# Patient Record
Sex: Male | Born: 1965
Health system: Southern US, Community
[De-identification: ages and names within clinical notes are randomized; demographics above are authoritative.]

## PROBLEM LIST (undated history)

## (undated) DIAGNOSIS — I513 Intracardiac thrombosis, not elsewhere classified: Secondary | ICD-10-CM

## (undated) DIAGNOSIS — K219 Gastro-esophageal reflux disease without esophagitis: Secondary | ICD-10-CM

## (undated) DIAGNOSIS — E7800 Pure hypercholesterolemia, unspecified: Secondary | ICD-10-CM

## (undated) DIAGNOSIS — I639 Cerebral infarction, unspecified: Secondary | ICD-10-CM

## (undated) DIAGNOSIS — T7840XA Allergy, unspecified, initial encounter: Secondary | ICD-10-CM

## (undated) DIAGNOSIS — G43109 Migraine with aura, not intractable, without status migrainosus: Secondary | ICD-10-CM

## (undated) DIAGNOSIS — Z8619 Personal history of other infectious and parasitic diseases: Secondary | ICD-10-CM

## (undated) DIAGNOSIS — I240 Acute coronary thrombosis not resulting in myocardial infarction: Secondary | ICD-10-CM

## (undated) DIAGNOSIS — G43909 Migraine, unspecified, not intractable, without status migrainosus: Secondary | ICD-10-CM

## (undated) DIAGNOSIS — I10 Essential (primary) hypertension: Secondary | ICD-10-CM

## (undated) HISTORY — PX: HERNIA REPAIR: SHX51

## (undated) HISTORY — DX: Allergy, unspecified, initial encounter: T78.40XA

## (undated) HISTORY — DX: Personal history of other infectious and parasitic diseases: Z86.19

---

## 2008-01-22 LAB — HM COLONOSCOPY

## 2009-09-08 DIAGNOSIS — F411 Generalized anxiety disorder: Secondary | ICD-10-CM | POA: Insufficient documentation

## 2011-03-15 DIAGNOSIS — K219 Gastro-esophageal reflux disease without esophagitis: Secondary | ICD-10-CM | POA: Insufficient documentation

## 2012-02-11 DIAGNOSIS — B009 Herpesviral infection, unspecified: Secondary | ICD-10-CM | POA: Insufficient documentation

## 2012-07-02 DIAGNOSIS — E559 Vitamin D deficiency, unspecified: Secondary | ICD-10-CM | POA: Insufficient documentation

## 2017-11-13 ENCOUNTER — Encounter: Payer: Self-pay | Admitting: Internal Medicine

## 2017-11-13 ENCOUNTER — Other Ambulatory Visit (INDEPENDENT_AMBULATORY_CARE_PROVIDER_SITE_OTHER): Payer: BLUE CROSS/BLUE SHIELD

## 2017-11-13 ENCOUNTER — Ambulatory Visit (INDEPENDENT_AMBULATORY_CARE_PROVIDER_SITE_OTHER): Payer: BLUE CROSS/BLUE SHIELD | Admitting: Internal Medicine

## 2017-11-13 VITALS — BP 130/88 | HR 107 | Ht 67.5 in | Wt 177.6 lb

## 2017-11-13 DIAGNOSIS — J45991 Cough variant asthma: Secondary | ICD-10-CM

## 2017-11-13 DIAGNOSIS — R05 Cough: Secondary | ICD-10-CM

## 2017-11-13 DIAGNOSIS — R059 Cough, unspecified: Secondary | ICD-10-CM

## 2017-11-13 LAB — CBC WITH DIFFERENTIAL/PLATELET
BASOS PCT: 1.1 % (ref 0.0–3.0)
Basophils Absolute: 0.1 10*3/uL (ref 0.0–0.1)
Eosinophils Absolute: 0.2 10*3/uL (ref 0.0–0.7)
Eosinophils Relative: 3.3 % (ref 0.0–5.0)
HEMATOCRIT: 42.8 % (ref 39.0–52.0)
HEMOGLOBIN: 14.8 g/dL (ref 13.0–17.0)
LYMPHS PCT: 21.2 % (ref 12.0–46.0)
Lymphs Abs: 1.6 10*3/uL (ref 0.7–4.0)
MCHC: 34.6 g/dL (ref 30.0–36.0)
MCV: 88.8 fl (ref 78.0–100.0)
MONOS PCT: 9.7 % (ref 3.0–12.0)
Monocytes Absolute: 0.7 10*3/uL (ref 0.1–1.0)
Neutro Abs: 4.8 10*3/uL (ref 1.4–7.7)
Neutrophils Relative %: 64.7 % (ref 43.0–77.0)
Platelets: 344 10*3/uL (ref 150.0–400.0)
RBC: 4.82 Mil/uL (ref 4.22–5.81)
RDW: 12.6 % (ref 11.5–15.5)
WBC: 7.4 10*3/uL (ref 4.0–10.5)

## 2017-11-13 LAB — SPIROMETRY WITH GRAPH: Nitric Oxide: 38

## 2017-11-13 MED ORDER — CEFDINIR 300 MG PO CAPS: 300.0000 mg | ORAL_CAPSULE | Freq: Two times a day (BID) | ORAL | 0 refills | Status: DC

## 2017-11-13 MED ORDER — MOMETASONE FURO-FORMOTEROL FUM 100-5 MCG/ACT IN AERO: 2.0000 | INHALATION_SPRAY | Freq: Two times a day (BID) | RESPIRATORY_TRACT | 0 refills | Status: DC

## 2017-11-13 NOTE — Progress Notes (Addendum)
Keith Black, male    DOB: 1966-01-24, 52 y.o.   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 Pulmonary Consult    Self referral.  Pt c/o cough, wheezing and SOB for the past 2-3 months. He moved here from [place] 6 months ago. He is using proair 4-5 x per day. He occ coughs up some green sputum in the am's.    last saba 1.5 hours / symb 160 one bid no better  Symptoms worse p stirs does not typically wake him up but p stirring has sev tsp green mucus  Just in am's assoc nasal congestion Continues with multiple saba thru the day with only temporary relief.   No obvious day to day or daytime variability or assoc excess/ purulent sputum or mucus plugs or hemoptysis or cp or chest tightness, subjective wheeze or overt  hb symptoms on ppi qam and prn h2 hs not taking now  Sleeping as above  without nocturnal exacerbation  of respiratory  c/o's or need for noct saba. Also denies any obvious fluctuation of symptoms with weather or environmental changes or other aggravating or alleviating factors except as outlined above   No unusual exposure hx or h/o childhood pna/ asthma or knowledge of premature birth.  Current Allergies, Complete Past Medical History, Past Surgical History, Family History, and Social History were reviewed in [employer] record.  ROS  The following  are not active complaints unless bolded Hoarseness, sore throat, dysphagia, dental problems, itching, sneezing,  nasal congestion or discharge of excess mucus or purulent secretions, ear ache,   fever, chills, sweats, unintended wt loss or wt gain, classically pleuritic or exertional cp,  orthopnea pnd or arm/hand swelling  or leg swelling, presyncope, palpitations, abdominal pain, anorexia, nausea, vomiting, diarrhea  or change in bowel habits or change in bladder habits, change in stools or change in urine, dysuria, hematuria,  rash, arthralgias, visual complaints, headache, numbness, weakness or ataxia or problems with walking or coordination,  change in mood or  memory.             No past medical history on file.  Outpatient Medications Prior to Visit  Medication Sig Dispense Refill  . albuterol (PROAIR HFA) 108 (90 Base) MCG/ACT inhaler Inhale 2 puffs into the lungs every 6 (six) hours as needed for wheezing or shortness of breath.    Marland Kitchen azelastine (ASTELIN) 0.1 % nasal spray Place 2 sprays into both nostrils 2 (two) times daily. Use in each nostril as directed    . budesonide-formoterol (SYMBICORT) 160-4.5 MCG/ACT inhaler Inhale 1 puff into the lungs 2 (two) times daily.    . cetirizine (ZYRTEC) 10 MG tablet Take 10 mg by mouth daily.    . Cholecalciferol (VITAMIN D-3) 1000 units CAPS Take 1 capsule by mouth daily.    Marland Kitchen  fluticasone (FLONASE) 50 MCG/ACT nasal spray Place 1 spray into both nostrils daily.    Marland Kitchen losartan (COZAAR) 100 MG tablet Take 100 mg by mouth daily.    . pantoprazole (PROTONIX) 40 MG tablet Take 40 mg by mouth daily.    . ranitidine (ZANTAC) 150 MG tablet Take 150 mg by mouth daily.    . simvastatin (ZOCOR) 20 MG tablet Take 20 mg by mouth daily.     No facility-administered medications prior to visit.               Objective:     BP 130/88 (BP Location: Left Arm, Cuff Size: Normal)   Pulse (!) 107   Ht 5' 7.5" (1.715 m)   Wt 177 lb 9.6 oz (80.6 kg)    SpO2 99%   BMI 27.41 kg/m   SpO2: 99 % RA  Pleasant healthy appearing wm nad   HEENT: nl dentition,  and oropharynx. Nl external ear canals without cough reflex -  moderate bilateral non-specific turbinate edema     NECK :  without JVD/Nodes/TM/ nl carotid upstrokes bilaterally   LUNGS: no acc muscle use,  Nl contour chest which is clear to A and P bilaterally without cough on insp or exp maneuvers   CV:  RRR  no s3 or murmur or increase in P2, and no edema   ABD:  soft and nontender with nl inspiratory excursion in the supine position. No bruits or organomegaly appreciated, bowel sounds nl  MS:  Nl gait/ ext warm without deformities, calf tenderness, cyanosis or clubbing No obvious joint restrictions   SKIN: warm and dry without lesions    NEURO:  alert, approp, nl sensorium with  no motor or cerebellar deficits apparent.    Labs ordered 11/13/2017  Allergy profile       Assessment   Cough variant asthma Allergy profile 11/13/2017 >  Eos 0. /  IgE   11/13/2017  After extensive coaching inhaler device,  effectiveness =    Try dulera 100 2bid  - FENO 11/13/2017  =   38  - Sinus CT 11/13/2017 >>>    DDX of  difficult airways management almost all start with A and  include Adherence, Ace Inhibitors, Acid Reflux, Active Sinus Disease, Alpha 1 Antitripsin deficiency, Anxiety masquerading as Airways dz,  ABPA,  Allergy(esp in young), Aspiration (esp in elderly), Adverse effects of meds,  Active smokers, A bunch of PE's (a small clot burden can't cause this syndrome unless there is already severe underlying pulm or vascular dz with poor reserve) plus two Bs  = Bronchiectasis and Beta blocker use..and one C= CHF   Adherence is always the initial "prime suspect" and is a multilayered concern that requires a "trust but verify" approach in every patient - starting with knowing how to use medications, especially inhalers, correctly, keeping up with refills and understanding the  fundamental difference between maintenance and prns vs those medications only taken for a very short course and then stopped and not refilled.  - misunderstood symbicort instructions - see hfa teaching - return with all meds in hand using a trust but verify approach to confirm accurate Medication  Reconciliation The principal here is that until we are certain that the  patients are doing what we've asked, it makes no sense to ask them to do more.    ? Allergy > check profile, hold prednisone for now  ? ABPA > check IgE  ? Active sinus infections > omincef 300 mg  bid and check sinus ct prior to jet travel to [place] and give a full 20 days if pos  ? Acid (or non-acid) GERD > always difficult to exclude as up to 75% of pts in some series report no assoc GI/ Heartburn symptoms> rec max (24h)  acid suppression and diet restrictions/ reviewed and instructions given in writing.  - Of the three most common causes of  Sub-acute / recurrent or chronic cough, only one (GERD)  can actually contribute to/ trigger  the other two (asthma and post nasal drip syndrome)  and perpetuate the cylce of cough.  While not intuitively obvious, many patients with chronic low grade reflux do not cough until there is a primary insult that disturbs the protective epithelial barrier and exposes sensitive nerve endings.   This is typically viral but can due to PNDS and  either may apply here.   The point is that once this occurs, it is difficult to eliminate the cycle  using anything but a maximally effective acid suppression regimen at least in the short run, accompanied by an appropriate diet to address non acid GERD and control / eliminate the cough itself with mucinex dm   ? Adverse drug effects > high dose symb may be problematic here > try dulera 100 2bid instead   ? acei effect from arb: For reasons that may related to vascular permability and nitric oxide pathways but not elevated  bradykinin levels (as seen with  ACEi  use) losartan in the generic form has been reported now from mulitple sources  to cause a similar pattern of non-specific  upper airway symptoms as seen with acei.   This has not been reported with exposure to the other ARB's to date, so may need to  try either generic diovan or avapro if ARB needed or use an alternative class altogether.  See:  Lelon Frohlich Allergy Asthma Immunol  2008: 101: p [phone]      Total time devoted to counseling  > 50 % of initial 60 min office visit:  review case with pt/ discussion of options/alternatives/ personally creating written customized instructions  in presence of pt  then going over those specific  Instructions directly with the pt including how to use all of the meds but in particular covering each new medication in detail and the difference between the maintenance= "automatic" meds and the prns using an action plan format for the latter (If this problem/symptom => do that organization reading Left to right).  Please see AVS from this visit for a full list of these instructions which I personally wrote for this pt and  are unique to this visit.   See device teaching which extended face to face time for this visit      Christinia Gully, MD 11/13/2017

## 2017-11-13 NOTE — Assessment & Plan Note (Addendum)
Allergy profile 11/13/2017 >  Eos 0. /  IgE   11/13/2017  After extensive coaching inhaler device,  effectiveness =    Try dulera 100 2bid  - FENO 11/13/2017  =   38  - Sinus CT 11/13/2017 >>>    DDX of  difficult airways management almost all start with A and  include Adherence, Ace Inhibitors, Acid Reflux, Active Sinus Disease, Alpha 1 Antitripsin deficiency, Anxiety masquerading as Airways dz,  ABPA,  Allergy(esp in young), Aspiration (esp in elderly), Adverse effects of meds,  Active smokers, A bunch of PE's (a small clot burden can't cause this syndrome unless there is already severe underlying pulm or vascular dz with poor reserve) plus two Bs  = Bronchiectasis and Beta blocker use..and one C= CHF   Adherence is always the initial "prime suspect" and is a multilayered concern that requires a "trust but verify" approach in every patient - starting with knowing how to use medications, especially inhalers, correctly, keeping up with refills and understanding the fundamental difference between maintenance and prns vs those medications only taken for a very short course and then stopped and not refilled.  - misunderstood symbicort instructions - see hfa teaching - return with all meds in hand using a trust but verify approach to confirm accurate Medication  Reconciliation The principal here is that until we are certain that the  patients are doing what we've asked, it makes no sense to ask them to do more.    ? Allergy > check profile, hold prednisone for now  ? ABPA > check IgE  ? Active sinus infections > omincef 300 mg bid and check sinus ct prior to jet travel to [place] and give a full 20 days if pos  ? Acid (or non-acid) GERD > always difficult to exclude as up to 75% of pts in some series report no assoc GI/ Heartburn symptoms> rec max (24h)  acid suppression and diet restrictions/ reviewed and instructions given in writing.  - Of the three most common causes of  Sub-acute / recurrent or  chronic cough, only one (GERD)  can actually contribute to/ trigger  the other two (asthma and post nasal drip syndrome)  and perpetuate the cylce of cough.  While not intuitively obvious, many patients with chronic low grade reflux do not cough until there is a primary insult that disturbs the protective epithelial barrier and exposes sensitive nerve endings.   This is typically viral but can due to PNDS and  either may apply here.   The point is that once this occurs, it is difficult to eliminate the cycle  using anything but a maximally effective acid suppression regimen at least in the short run, accompanied by an appropriate diet to address non acid GERD and control / eliminate the cough itself with mucinex dm   ? Adverse drug effects > high dose symb may be problematic here > try dulera 100 2bid instead   ? acei effect from arb: For reasons that may related to vascular permability and nitric oxide pathways but not elevated  bradykinin levels (as seen with  ACEi use) losartan in the generic form has been reported now from mulitple sources  to cause a similar pattern of non-specific  upper airway symptoms as seen with acei.   This has not been reported with exposure to the other ARB's to date, so may need to  try either generic diovan or avapro if ARB needed or use an alternative class altogether.  See:  [facility] Allergy Asthma Immunol  2008: 101: p [phone]      Total time devoted to counseling  > 50 % of initial 60 min office visit:  review case with pt/ discussion of options/alternatives/ personally creating written customized instructions  in presence of pt  then going over those specific  Instructions directly with the pt including how to use all of the meds but in particular covering each new medication in detail and the difference between the maintenance= "automatic" meds and the prns using an action plan format for the latter (If this problem/symptom => do that organization reading Left to right).   Please see AVS from this visit for a full list of these instructions which I personally wrote for this pt and  are unique to this visit.   See device teaching which extended face to face time for this visit

## 2017-11-13 NOTE — Patient Instructions (Addendum)
Stop symbicort and start dulera 100 Take 2 puffs first thing in am and then another 2 puffs about 12 hours later.   Only use your albuterol as a rescue medication to be used if you can't catch your breath by resting or doing a relaxed purse lip breathing pattern.  - The less you use it, the better it will work when you need it. - Ok to use up to 2 puffs  every 4 hours if you must but call for immediate appointment if use goes up over your usual need - Don't leave home without it !!  (think of it like the spare tire for your car)   Omnicef 300 mg twice daily x 10 days and eat yogurt or take pro biotic to prevent diarrhea   Continue protonix 40 mg Take 30-60 min before first meal of the day and zantac 150 mg at bedtime  until return   GERD (REFLUX)  is an extremely common cause of respiratory symptoms just like yours , many times with no obvious heartburn at all.    It can be treated with medication, but also with lifestyle changes including elevation of the head of your bed (ideally with 6 inch  bed blocks),  Smoking cessation, avoidance of late meals, excessive alcohol, and avoid fatty foods, chocolate, peppermint, colas, red wine, and acidic juices such as orange juice.  NO MINT OR MENTHOL PRODUCTS SO NO COUGH DROPS   USE SUGARLESS CANDY INSTEAD (Jolley ranchers or Stover's or Life Savers) or even ice chips will also do - the key is to swallow to prevent all throat clearing. NO OIL BASED VITAMINS - use powdered substitutes.   For cough mucinex dm 1200 mg every 12 hours as needed   Please see patient coordinator before you leave today  to schedule sinus CT   Please remember to go to the lab department downstairs in the basement  for your tests - we will call you with the results when they are available.      Please schedule a follow up office visit in 2 weeks, sooner if needed  with all medications /inhalers/ solutions in hand so we can verify exactly what you are taking. This includes all  medications from all doctors and over the counters

## 2017-11-14 ENCOUNTER — Telehealth: Payer: Self-pay | Admitting: Internal Medicine

## 2017-11-14 LAB — RESPIRATORY ALLERGY PROFILE REGION II ~~LOC~~
Allergen, Cottonwood, t14: <0.1 kU/L
Allergen, D pternoyssinus,d7: <0.1 kU/L
Allergen, Mouse Urine Protein, e78: <0.1 kU/L
Allergen, Oak,t7: <0.1 kU/L
Bermuda Grass: <0.1 kU/L
CLASS: 0
CLASS: 0
CLASS: 0
CLASS: 0
CLASS: 0
CLASS: 0
CLASS: 0
CLASS: 0
CLASS: 0
CLASS: 0
CLASS: 0
Cat Dander: <0.1 kU/L
Class: 0
Class: 0
Class: 0
Class: 0
Class: 0
Class: 0
Class: 0
Class: 0
Class: 0
Class: 0
Class: 0
Class: 0
Class: 0
Cockroach: <0.1 kU/L
D. farinae: <0.1 kU/L
Elm IgE: <0.1 kU/L
IgE (Immunoglobulin E), Serum: 88 kU/L (ref <=114)
Johnson Grass: <0.1 kU/L
Sheep Sorrel IgE: <0.1 kU/L
Timothy Grass: <0.1 kU/L

## 2017-11-14 LAB — INTERPRETATION:

## 2017-11-14 NOTE — Telephone Encounter (Signed)
Called and spoke with pt's spouse Keith Black letting her know that as soon as we had the results of the labwork we would call her to let her know the results.  Kristine expressed understanding. Nothing further needed.

## 2017-11-14 NOTE — Progress Notes (Signed)
Spoke with pt's spouse and notified of results per Dr. Wert. She verbalized understanding and denied any questions. 

## 2017-11-27 ENCOUNTER — Telehealth: Payer: Self-pay | Admitting: Internal Medicine

## 2017-11-27 NOTE — Telephone Encounter (Signed)
PA initiated for [facility] 136mcg Inhaler today 11/27/17 Processed request through www.covermymeds.com - Key: [id number]  Completed the forms for [facility] insurance It will take anywhere from 24-72 hours for decision of approval or denial.  Routing message to [place] to f/u on later in the week.

## 2017-11-29 NOTE — Telephone Encounter (Signed)
Update on Pt's PA request for [facility] 100 Inhaler today This request has received a Cancelled outcome. This may mean either your patient does not have active coverage with this plan, this authorization was processed as a duplicate request, or an authorization was not needed for this medication. Note any additional information provided by [facility] at the bottom of this request, and contact [employer]  directly for further   Pt no longer has insurance as of 11/27/2017  Attempted to call patient today regarding above information. I did not receive an answer at time of call. I have left a voicemail message for pt to return call. X1  Routing message to [employer] to f/u with patient.

## 2017-12-02 DIAGNOSIS — H532 Diplopia: Secondary | ICD-10-CM | POA: Diagnosis not present

## 2017-12-02 DIAGNOSIS — H5310 Unspecified subjective visual disturbances: Secondary | ICD-10-CM | POA: Diagnosis not present

## 2017-12-02 DIAGNOSIS — G43109 Migraine with aura, not intractable, without status migrainosus: Secondary | ICD-10-CM | POA: Diagnosis not present

## 2017-12-04 ENCOUNTER — Inpatient Hospital Stay: Admission: RE | Admit: 2017-12-04 | Payer: BLUE CROSS/BLUE SHIELD | Source: Ambulatory Visit

## 2017-12-06 ENCOUNTER — Ambulatory Visit: Payer: BLUE CROSS/BLUE SHIELD | Admitting: Internal Medicine

## 2017-12-09 NOTE — Telephone Encounter (Signed)
LMTCB

## 2017-12-11 NOTE — Telephone Encounter (Signed)
LMTCB

## 2017-12-12 ENCOUNTER — Ambulatory Visit (INDEPENDENT_AMBULATORY_CARE_PROVIDER_SITE_OTHER)
Admission: RE | Admit: 2017-12-12 | Discharge: 2017-12-12 | Disposition: A | Discharge status: Home / Self Care | Payer: BLUE CROSS/BLUE SHIELD | Source: Ambulatory Visit | Attending: Internal Medicine | Admitting: Internal Medicine

## 2017-12-12 DIAGNOSIS — J45909 Unspecified asthma, uncomplicated: Secondary | ICD-10-CM | POA: Diagnosis not present

## 2017-12-12 DIAGNOSIS — R059 Cough, unspecified: Secondary | ICD-10-CM

## 2017-12-12 DIAGNOSIS — J45991 Cough variant asthma: Secondary | ICD-10-CM | POA: Diagnosis not present

## 2017-12-12 DIAGNOSIS — R05 Cough: Secondary | ICD-10-CM | POA: Diagnosis not present

## 2017-12-12 NOTE — Progress Notes (Signed)
LMTCB

## 2017-12-13 NOTE — Telephone Encounter (Signed)
Patient's wife is calling back about Dulera.  States she spoke with insurance and they have told her this requires PA, fax [phone].  Altha Harm, (wife) CB is [phone].

## 2017-12-13 NOTE — Telephone Encounter (Signed)
Called and spoke with patient's wife. She states she called [facility] and was not sure if the PA was started or not.   I called [facility] to verify the PA and it wasn't started so I started a PA over the phone for [facility]. [facility] representative stated the PA was marked urgent and would notify patient in 24-48 hours if approved. PA: [id number]  Saw Dr. Morrison Old recs , called patient to inform of the phone call and Dr. Morrison Old rec. Patient verbalized understanding, however patient did not want any symbicort samples at this time. Patient states he hasn't been taking dulera for about a week now and it is not an urgent matter.  Will keep in box to follow up on PA  Will route to MW as Juluis Rainier

## 2017-12-13 NOTE — Telephone Encounter (Signed)
[  atient returned phone call; pt contact# [phone]

## 2017-12-13 NOTE — Telephone Encounter (Signed)
Should be able to offer sbicort 80 2bid sample until work out insurance

## 2017-12-13 NOTE — Telephone Encounter (Signed)
Called spoke with patient's wife as patient is at work. Read the information provided per [facility] phone note on 11/29/2017. She says they have insurance and read me the numbers off their [place] card, which match what our records show.  They also have their medication sent to [facility] and have a card from there. Information provided by patient's wife below: [facility] RXBIN: [id number] RXPCN:[id number] [id number] ID: [id number].  Wife is going to call [employer] and see what is going on.  We will need to follow back up with patient next week. Patient is about out of the [facility] per wife and may not be taking it. No samples available at this time.  Routing to MW and Magda Paganini

## 2017-12-16 ENCOUNTER — Encounter: Payer: Self-pay | Admitting: Internal Medicine

## 2017-12-16 ENCOUNTER — Ambulatory Visit (INDEPENDENT_AMBULATORY_CARE_PROVIDER_SITE_OTHER): Payer: BLUE CROSS/BLUE SHIELD | Admitting: Internal Medicine

## 2017-12-16 VITALS — BP 118/84 | HR 100 | Ht 67.5 in | Wt 177.4 lb

## 2017-12-16 DIAGNOSIS — Z23 Encounter for immunization: Secondary | ICD-10-CM

## 2017-12-16 DIAGNOSIS — J45991 Cough variant asthma: Secondary | ICD-10-CM | POA: Diagnosis not present

## 2017-12-16 MED ORDER — BUDESONIDE-FORMOTEROL FUMARATE 80-4.5 MCG/ACT IN AERO: 2.0000 | INHALATION_SPRAY | Freq: Two times a day (BID) | RESPIRATORY_TRACT | 0 refills | Status: DC

## 2017-12-16 MED ORDER — BUDESONIDE-FORMOTEROL FUMARATE 80-4.5 MCG/ACT IN AERO: 2.0000 | INHALATION_SPRAY | Freq: Two times a day (BID) | RESPIRATORY_TRACT | 11 refills | Status: DC

## 2017-12-16 MED ORDER — PANTOPRAZOLE SODIUM 40 MG PO TBEC: 40.0000 mg | DELAYED_RELEASE_TABLET | Freq: Every day | ORAL | 1 refills | Status: DC

## 2017-12-16 NOTE — Patient Instructions (Addendum)
Symbicort 80 can be taken up to 2 puff every 12 hours if any symptoms of cough / congestion/ wheeze/ short of breath as needed   Work on inhaler technique:  relax and gently blow all the way out then take a nice smooth deep breath back in, triggering the inhaler at same time you start breathing in.  Hold for up to 5 seconds if you can. Blow out thru nose. Rinse and gargle with water when done  Please schedule a follow up office visit in 6 weeks, call sooner if needed  - needs pfts on return

## 2017-12-16 NOTE — Progress Notes (Signed)
Keith Black, male    DOB: 02/16/1966, 52 y.o.   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 Pulmonary Consult    Self referral.  Pt c/o cough, wheezing and SOB for the past 2-3 months. He moved here from [place] 6 months ago. He is using proair 4-5 x per day. He occ coughs up some green sputum in the am's.    last saba 1.5 hours / symb 160 one bid no better  Symptoms worse p stirs does not typically wake him up but p stirring has sev tsp green mucus  Just in am's assoc nasal congestion Continues with multiple saba thru the day with only temporary relief. rec Stop symbicort and start dulera 100 Take 2 puffs first thing in am and then another 2 puffs about 12 hours later.  Only use your albuterol as a rescue medication Omnicef 300 mg twice daily x 10 days and eat yogurt or take pro biotic to prevent diarrhea  Continue protonix 40 mg Take 30-60 min before first meal of the day and zantac 150 mg at bedtime  until return  GERD  For cough mucinex dm 1200 mg every 12 hours as needed Please see patient coordinator before you leave today  to schedule sinus CT Please remember to go to the lab department       12/16/2017  f/u ov/Wert re: cough variant asthma some worse off symbicort/dulera but not bad enough to use saba  Chief Complaint  Patient presents with  . Follow-up    Cough has  improved.  He is having some chest tightness this morning. He has not had to use his albuterol inhaler.    Dyspnea:  No limits while on maint rx Cough: minimal/  Sleeping: ok SABA use: none   No obvious day to day or daytime variability or assoc excess/ purulent sputum or mucus plugs or hemoptysis or cp or subjective wheeze or overt sinus or hb symptoms.   Sleeping as above  without nocturnal  or early am exacerbation  of respiratory  c/o's or need for noct saba. Also denies any obvious fluctuation of symptoms with weather or environmental changes or other aggravating or alleviating factors except as outlined above   No unusual exposure hx or h/o childhood pna/ asthma or knowledge of premature birth.  Current Allergies, Complete Past Medical History, Past Surgical History, Family History, and Social History were reviewed in [employer] record.  ROS  The following are not active complaints unless bolded Hoarseness, sore throat, dysphagia, dental problems, itching, sneezing,  nasal congestion or discharge of excess mucus or purulent secretions, ear ache,   fever, chills, sweats, unintended wt loss or wt gain, classically pleuritic or exertional cp,  orthopnea pnd or arm/hand swelling  or leg swelling, presyncope, palpitations, abdominal pain, anorexia, nausea, vomiting,  diarrhea  or change in bowel habits or change in bladder habits, change in stools or change in urine, dysuria, hematuria,  rash, arthralgias, visual complaints, headache, numbness, weakness or ataxia or problems with walking or coordination,  change in mood or  memory.        Current Meds  Medication Sig  . albuterol (PROAIR HFA) 108 (90 Base) MCG/ACT inhaler Inhale 2 puffs into the lungs every 6 (six) hours as needed for wheezing or shortness of breath.  Marland Kitchen azelastine (ASTELIN) 0.1 % nasal spray Place 2 sprays into both nostrils 2 (two) times daily. Use in each nostril as directed  . cetirizine (ZYRTEC) 10  MG tablet Take 10 mg by mouth daily.  . Cholecalciferol ([employer]S VIT D 5000 HIGH-POTENCY PO) Take 1 capsule by mouth daily.  . fluticasone (FLONASE) 50 MCG/ACT nasal spray Place 1 spray into both nostrils daily.  Marland Kitchen losartan (COZAAR) 100 MG tablet Take 100 mg by mouth daily.  . pantoprazole (PROTONIX) 40 MG tablet Take 1 tablet (40 mg total) by mouth daily.  . Probiotic Product (ALIGN PO) Take 1 capsule by mouth daily.  . ranitidine (ZANTAC) 150 MG tablet Take 150 mg by mouth daily.  . simvastatin (ZOCOR) 20 MG tablet Take 20 mg by mouth daily.  Marland Kitchen UNABLE TO FIND Med Name: [employer]  .   pantoprazole (PROTONIX) 40 MG tablet Take 40 mg by mouth daily.                         Objective:      amb wm nad  Wt Readings from Last 3 Encounters:  12/16/17 177 lb 6.4 oz (80.5 kg)  11/13/17 177 lb 9.6 oz (80.6 kg)     Vital signs reviewed - Note on arrival 02 sats  100% on RA      HEENT: nl dentition, turbinates bilaterally, and oropharynx. Nl external ear canals without cough reflex   NECK :  without JVD/Nodes/TM/ nl carotid upstrokes bilaterally   LUNGS: no acc muscle use,  Nl contour chest which is clear to A and P bilaterally without cough on insp or exp maneuvers   CV:  RRR  no s3 or murmur or increase in P2, and no edema   ABD:  soft and nontender with nl inspiratory excursion in the supine position. No bruits or organomegaly appreciated, bowel sounds nl  MS:  Nl gait/ ext warm without deformities, calf tenderness, cyanosis or clubbing No obvious joint restrictions   SKIN: warm and dry without lesions    NEURO:  alert, approp, nl sensorium with  no motor or cerebellar deficits apparent.        Assessment

## 2017-12-17 ENCOUNTER — Telehealth: Payer: Self-pay | Admitting: Internal Medicine

## 2017-12-17 ENCOUNTER — Telehealth: Payer: Self-pay | Admitting: *Deleted

## 2017-12-17 DIAGNOSIS — J45991 Cough variant asthma: Secondary | ICD-10-CM

## 2017-12-17 NOTE — Telephone Encounter (Signed)
-----   Message from Tanda Rockers, MD sent at 12/17/2017  9:20 AM EDT ----- Chart reviewed and realized he has not had full pfts so need to do on return but not use his symbicort prior (if at all possible)

## 2017-12-17 NOTE — Telephone Encounter (Signed)
Medication name and strength: Dulera 100 Provider: Dr. Melvyn Novas Pharmacy: [facility] [address] Patient insurance ID: [id number]  Was the PA started on CMM?  Yes If yes, please enter the Key: [id number] Timeframe for approval/denial: 24 hours

## 2017-12-17 NOTE — Telephone Encounter (Signed)
PA for [facility] 100 has been denied.  Pt must try or fail at least 3 of the following: Advair Diskus, Advair HFA, Breo Ellipta, Symbicort.   Per pt chart, pt has only tried Symbicort in the past.  MW please advise on preferred alternative.  Thanks!

## 2017-12-17 NOTE — Telephone Encounter (Signed)
See phone note dated 12/17/17

## 2017-12-17 NOTE — Telephone Encounter (Signed)
Pt already started on symbicort 80 at recent ov so will close this encounter

## 2017-12-18 ENCOUNTER — Encounter: Payer: Self-pay | Admitting: Internal Medicine

## 2017-12-18 NOTE — Assessment & Plan Note (Signed)
Allergy profile 11/13/2017 >  Eos 0.2/  IgE  88 RAST neg  11/13/2017       Try dulera 100 2bid > changed to symb 80 2bid by insurance restriction - FENO 11/13/2017  =   38  - Sinus CT 12/12/2017  >>>  wnl    12/16/2017  After extensive coaching inhaler device,  effectiveness =    90%    While on low dose ics/laba>> All goals of chronic asthma control met including optimal function and elimination of symptoms with minimal need for rescue therapy.  Contingencies discussed in full including contacting this office immediately if not controlling the symptoms using the rule of two's.     His condition must be mild as when he ran out of dulera he had minimal symptoms which immediately improved when had access to symbicort 80 sample.    Based on two studies from NEJM  378; 20 p 1865 (2018) and 380 : p2020-30 (2019) in pts with mild asthma it is reasonable to use low dose symbicort eg 80 2bid "prn" flare in this setting but I emphasized this was only shown with symbicort and takes advantage of the rapid onset of action but is not the same as "rescue therapy" but can be stopped once the acute symptoms have resolved and the need for rescue has been minimized (< 2 x weekly)     I had an extended discussion with the patient reviewing all relevant studies completed to date and  lasting 15 to 20 minutes of a 25 minute visit    See device teaching which extended face to face time for this visit.  Each maintenance medication was reviewed in detail including emphasizing most importantly the difference between maintenance and prns and under what circumstances the prns are to be triggered using an action plan format that is not reflected in the computer generated alphabetically organized AVS which I have not found useful in most complex patients, especially with respiratory illnesses  Please see AVS for specific instructions unique to this visit that I personally wrote and verbalized to the the pt in detail and  then reviewed with pt  by my nurse highlighting any  changes in therapy recommended at today's visit to their plan of care.

## 2017-12-31 DIAGNOSIS — H532 Diplopia: Secondary | ICD-10-CM | POA: Diagnosis not present

## 2017-12-31 DIAGNOSIS — G43109 Migraine with aura, not intractable, without status migrainosus: Secondary | ICD-10-CM | POA: Diagnosis not present

## 2017-12-31 DIAGNOSIS — H5310 Unspecified subjective visual disturbances: Secondary | ICD-10-CM | POA: Diagnosis not present

## 2018-01-18 DIAGNOSIS — H6501 Acute serous otitis media, right ear: Secondary | ICD-10-CM | POA: Diagnosis not present

## 2018-01-18 DIAGNOSIS — H6121 Impacted cerumen, right ear: Secondary | ICD-10-CM | POA: Diagnosis not present

## 2018-01-18 DIAGNOSIS — J019 Acute sinusitis, unspecified: Secondary | ICD-10-CM | POA: Diagnosis not present

## 2018-01-22 ENCOUNTER — Telehealth: Payer: Self-pay | Admitting: Internal Medicine

## 2018-01-22 NOTE — Telephone Encounter (Signed)
Stop doxycycline. Needs OV - please check Dr. Gustavus Bryant schedule first. If he has no available appointments please place on APP schedule. Thanks.

## 2018-01-22 NOTE — Telephone Encounter (Signed)
Message originally sent to [place]. SG is not here today.   TN please advise and route back to triage.

## 2018-01-22 NOTE — Telephone Encounter (Signed)
Called and spoke to patient wife, states the patient is new to [place] and he is having sinus infection symptoms, sinus pressure affecting his right ear, they went to urgent care, was told he had fluid in his ear and gave him doxycycline and the patient started having diarrhea and stomach cramping. The patient has taken 5 doses of the doxycycline and the diarrhea has been consistent. [facility] [address]. He was last given Cefdinir by MW and noticed a huge difference after a few days. The wife states she is sitting beside him and can hear his stomach easily.  SG please advise MW will not be in until this afternoon if willing to give patient a antibiotic as patient is still having significant ear pressure.

## 2018-01-22 NOTE — Telephone Encounter (Signed)
LMTCB. Patient needs to come in for a office visit. Check Wert schedule first, if no space, assign to APP.

## 2018-01-23 NOTE — Telephone Encounter (Signed)
Called pt's wife Altha Harm letting her know we needed to get pt scheduled for an OV. Per Altha Harm, pt went back to urgent care and they gave him a different abx of Cefdinir to see if that would help with his symptoms.  I stated to Altha Harm if pt was still no better after that abx, we would need to get pt scheduled for a sooner appt than the one he has scheduled.  Christine expressed understanding. Nothing further needed.

## 2018-02-03 ENCOUNTER — Ambulatory Visit: Payer: BLUE CROSS/BLUE SHIELD | Admitting: Internal Medicine

## 2018-02-10 ENCOUNTER — Ambulatory Visit: Payer: BLUE CROSS/BLUE SHIELD | Admitting: Internal Medicine

## 2018-02-21 ENCOUNTER — Ambulatory Visit: Payer: BLUE CROSS/BLUE SHIELD | Admitting: Internal Medicine

## 2018-03-07 ENCOUNTER — Ambulatory Visit: Payer: BLUE CROSS/BLUE SHIELD | Admitting: Internal Medicine

## 2018-03-07 ENCOUNTER — Ambulatory Visit (INDEPENDENT_AMBULATORY_CARE_PROVIDER_SITE_OTHER): Payer: BLUE CROSS/BLUE SHIELD | Admitting: Internal Medicine

## 2018-03-07 DIAGNOSIS — J45991 Cough variant asthma: Secondary | ICD-10-CM

## 2018-03-07 NOTE — Progress Notes (Deleted)
Patient refused to complete the PFT today. He walked out after trying the pre spiro three times.

## 2018-03-07 NOTE — Progress Notes (Signed)
Patient refused to complete the PFT today. He walked out after trying the pre spiro three times.

## 2018-03-10 ENCOUNTER — Telehealth: Payer: Self-pay | Admitting: Internal Medicine

## 2018-03-10 NOTE — Telephone Encounter (Signed)
Patient's wife called to complain about the experience they had on 03/07/2018. Patient's wife, Minette Headland, stated that it was a terrible experience and that her husband didn't get the test (PFT) as he should have. She stated that patient came out to the lobby within a few minutes of going back to start the test and stated that he failed the test. Per wife,patient stated someone was going to come out and talk with them. Patient's wife stated that they didn't get answers and waited a long time for someone to come out.  They did not stay for the appointment with MW because they thought someone would tell them if they needed to stay. The PFT was 10am and OV 11am. Minette Headland stated that they waited 40 minutes and that was very frustrating to not have any answers.   Per note from PFT: "Patient refused to complete the PFT today. He walked out after trying the pre spiro three times."  Offered to reschedule PFT and OV.  Minette Headland declined and stated they might find another pulmonologist but that she will talk with her husband and call back to schedule if they decide to give this a try again.   Nothing further needed at this time.

## 2018-04-08 ENCOUNTER — Encounter (HOSPITAL_COMMUNITY): Payer: Self-pay

## 2018-04-08 ENCOUNTER — Observation Stay (HOSPITAL_COMMUNITY): Payer: BLUE CROSS/BLUE SHIELD

## 2018-04-08 ENCOUNTER — Emergency Department (HOSPITAL_COMMUNITY): Payer: BLUE CROSS/BLUE SHIELD

## 2018-04-08 ENCOUNTER — Inpatient Hospital Stay (HOSPITAL_COMMUNITY)
Admission: EM | Admit: 2018-04-08 | Discharge: 2018-04-10 | DRG: 065 | Disposition: A | Discharge status: Home / Self Care | Payer: BLUE CROSS/BLUE SHIELD | Attending: Internal Medicine | Admitting: Internal Medicine

## 2018-04-08 DIAGNOSIS — I110 Hypertensive heart disease with heart failure: Secondary | ICD-10-CM | POA: Diagnosis not present

## 2018-04-08 DIAGNOSIS — G43809 Other migraine, not intractable, without status migrainosus: Secondary | ICD-10-CM | POA: Diagnosis present

## 2018-04-08 DIAGNOSIS — R000 Tachycardia, unspecified: Secondary | ICD-10-CM | POA: Diagnosis not present

## 2018-04-08 DIAGNOSIS — Z823 Family history of stroke: Secondary | ICD-10-CM | POA: Diagnosis not present

## 2018-04-08 DIAGNOSIS — I10 Essential (primary) hypertension: Secondary | ICD-10-CM | POA: Diagnosis not present

## 2018-04-08 DIAGNOSIS — Z7951 Long term (current) use of inhaled steroids: Secondary | ICD-10-CM | POA: Diagnosis not present

## 2018-04-08 DIAGNOSIS — I513 Intracardiac thrombosis, not elsewhere classified: Secondary | ICD-10-CM | POA: Diagnosis present

## 2018-04-08 DIAGNOSIS — R402362 Coma scale, best motor response, obeys commands, at arrival to emergency department: Secondary | ICD-10-CM | POA: Diagnosis present

## 2018-04-08 DIAGNOSIS — E785 Hyperlipidemia, unspecified: Secondary | ICD-10-CM | POA: Diagnosis not present

## 2018-04-08 DIAGNOSIS — Z951 Presence of aortocoronary bypass graft: Secondary | ICD-10-CM

## 2018-04-08 DIAGNOSIS — J45909 Unspecified asthma, uncomplicated: Secondary | ICD-10-CM | POA: Diagnosis present

## 2018-04-08 DIAGNOSIS — F17220 Nicotine dependence, chewing tobacco, uncomplicated: Secondary | ICD-10-CM | POA: Diagnosis present

## 2018-04-08 DIAGNOSIS — K219 Gastro-esophageal reflux disease without esophagitis: Secondary | ICD-10-CM | POA: Diagnosis present

## 2018-04-08 DIAGNOSIS — I6300 Cerebral infarction due to thrombosis of unspecified precerebral artery: Secondary | ICD-10-CM | POA: Diagnosis not present

## 2018-04-08 DIAGNOSIS — R29810 Facial weakness: Secondary | ICD-10-CM | POA: Diagnosis not present

## 2018-04-08 DIAGNOSIS — I429 Cardiomyopathy, unspecified: Secondary | ICD-10-CM | POA: Diagnosis present

## 2018-04-08 DIAGNOSIS — Z825 Family history of asthma and other chronic lower respiratory diseases: Secondary | ICD-10-CM

## 2018-04-08 DIAGNOSIS — G8191 Hemiplegia, unspecified affecting right dominant side: Secondary | ICD-10-CM | POA: Diagnosis present

## 2018-04-08 DIAGNOSIS — R002 Palpitations: Secondary | ICD-10-CM | POA: Diagnosis not present

## 2018-04-08 DIAGNOSIS — Z8673 Personal history of transient ischemic attack (TIA), and cerebral infarction without residual deficits: Secondary | ICD-10-CM

## 2018-04-08 DIAGNOSIS — Z8249 Family history of ischemic heart disease and other diseases of the circulatory system: Secondary | ICD-10-CM

## 2018-04-08 DIAGNOSIS — G459 Transient cerebral ischemic attack, unspecified: Secondary | ICD-10-CM | POA: Diagnosis not present

## 2018-04-08 DIAGNOSIS — R402142 Coma scale, eyes open, spontaneous, at arrival to emergency department: Secondary | ICD-10-CM | POA: Diagnosis not present

## 2018-04-08 DIAGNOSIS — R402252 Coma scale, best verbal response, oriented, at arrival to emergency department: Secondary | ICD-10-CM | POA: Diagnosis present

## 2018-04-08 DIAGNOSIS — E7800 Pure hypercholesterolemia, unspecified: Secondary | ICD-10-CM | POA: Diagnosis present

## 2018-04-08 DIAGNOSIS — I5022 Chronic systolic (congestive) heart failure: Secondary | ICD-10-CM | POA: Diagnosis present

## 2018-04-08 DIAGNOSIS — R29700 NIHSS score 0: Secondary | ICD-10-CM | POA: Diagnosis not present

## 2018-04-08 DIAGNOSIS — I5021 Acute systolic (congestive) heart failure: Secondary | ICD-10-CM | POA: Diagnosis not present

## 2018-04-08 DIAGNOSIS — I5020 Unspecified systolic (congestive) heart failure: Secondary | ICD-10-CM | POA: Diagnosis not present

## 2018-04-08 DIAGNOSIS — I63412 Cerebral infarction due to embolism of left middle cerebral artery: Secondary | ICD-10-CM | POA: Diagnosis not present

## 2018-04-08 DIAGNOSIS — I639 Cerebral infarction, unspecified: Secondary | ICD-10-CM | POA: Diagnosis not present

## 2018-04-08 DIAGNOSIS — I519 Heart disease, unspecified: Secondary | ICD-10-CM | POA: Diagnosis not present

## 2018-04-08 DIAGNOSIS — I25110 Atherosclerotic heart disease of native coronary artery with unstable angina pectoris: Secondary | ICD-10-CM | POA: Diagnosis not present

## 2018-04-08 DIAGNOSIS — Z79899 Other long term (current) drug therapy: Secondary | ICD-10-CM

## 2018-04-08 DIAGNOSIS — R531 Weakness: Secondary | ICD-10-CM | POA: Diagnosis not present

## 2018-04-08 DIAGNOSIS — I6389 Other cerebral infarction: Secondary | ICD-10-CM | POA: Diagnosis not present

## 2018-04-08 DIAGNOSIS — R200 Anesthesia of skin: Secondary | ICD-10-CM | POA: Diagnosis not present

## 2018-04-08 DIAGNOSIS — I2510 Atherosclerotic heart disease of native coronary artery without angina pectoris: Secondary | ICD-10-CM | POA: Diagnosis not present

## 2018-04-08 DIAGNOSIS — I240 Acute coronary thrombosis not resulting in myocardial infarction: Secondary | ICD-10-CM | POA: Diagnosis present

## 2018-04-08 DIAGNOSIS — R202 Paresthesia of skin: Secondary | ICD-10-CM | POA: Diagnosis not present

## 2018-04-08 HISTORY — DX: Essential (primary) hypertension: I10

## 2018-04-08 HISTORY — DX: Acute coronary thrombosis not resulting in myocardial infarction: I24.0

## 2018-04-08 HISTORY — DX: Intracardiac thrombosis, not elsewhere classified: I51.3

## 2018-04-08 HISTORY — DX: Migraine with aura, not intractable, without status migrainosus: G43.109

## 2018-04-08 HISTORY — DX: Gastro-esophageal reflux disease without esophagitis: K21.9

## 2018-04-08 HISTORY — DX: Cerebral infarction, unspecified: I63.9

## 2018-04-08 HISTORY — DX: Pure hypercholesterolemia, unspecified: E78.00

## 2018-04-08 LAB — I-STAT TROPONIN, ED: Troponin i, poc: 0.02 ng/mL (ref 0.00–0.08)

## 2018-04-08 LAB — CBC
HCT: 44.5 % (ref 39.0–52.0)
Hemoglobin: 14.7 g/dL (ref 13.0–17.0)
MCH: 29.6 pg (ref 26.0–34.0)
MCHC: 33 g/dL (ref 30.0–36.0)
MCV: 89.5 fL (ref 80.0–100.0)
Platelets: 334 10*3/uL (ref 150–400)
RBC: 4.97 MIL/uL (ref 4.22–5.81)
RDW: 11.9 % (ref 11.5–15.5)
WBC: 6.4 10*3/uL (ref 4.0–10.5)
nRBC: 0 % (ref 0.0–0.2)

## 2018-04-08 LAB — COMPREHENSIVE METABOLIC PANEL
ALT: 26 U/L (ref 0–44)
ANION GAP: 11 (ref 5–15)
AST: 28 U/L (ref 15–41)
Albumin: 4.5 g/dL (ref 3.5–5.0)
Alkaline Phosphatase: 50 U/L (ref 38–126)
BUN: 14 mg/dL (ref 6–20)
CO2: 24 mmol/L (ref 22–32)
Calcium: 9.2 mg/dL (ref 8.9–10.3)
Chloride: 102 mmol/L (ref 98–111)
Creatinine, Ser: 1.02 mg/dL (ref 0.61–1.24)
GFR calc non Af Amer: >60 mL/min (ref >60)
Glucose, Bld: 114 mg/dL — ABNORMAL HIGH (ref 70–99)
Potassium: 4.6 mmol/L (ref 3.5–5.1)
Sodium: 137 mmol/L (ref 135–145)
TOTAL PROTEIN: 7.9 g/dL (ref 6.5–8.1)
Total Bilirubin: 0.8 mg/dL (ref 0.3–1.2)

## 2018-04-08 LAB — DIFFERENTIAL
ABS IMMATURE GRANULOCYTES: 0.03 10*3/uL (ref 0.00–0.07)
Basophils Absolute: 0.1 10*3/uL (ref 0.0–0.1)
Basophils Relative: 1 %
EOS PCT: 3 %
Eosinophils Absolute: 0.2 10*3/uL (ref 0.0–0.5)
Immature Granulocytes: 1 %
Lymphocytes Relative: 28 %
Lymphs Abs: 1.8 10*3/uL (ref 0.7–4.0)
MONOS PCT: 11 %
Monocytes Absolute: 0.7 10*3/uL (ref 0.1–1.0)
Neutro Abs: 3.7 10*3/uL (ref 1.7–7.7)
Neutrophils Relative %: 56 %

## 2018-04-08 LAB — PROTIME-INR
INR: 0.97
Prothrombin Time: 12.8 seconds (ref 11.4–15.2)

## 2018-04-08 LAB — CBG MONITORING, ED: GLUCOSE-CAPILLARY: 102 mg/dL — AB (ref 70–99)

## 2018-04-08 LAB — APTT: aPTT: 27 seconds (ref 24–36)

## 2018-04-08 MED ORDER — ALBUTEROL SULFATE HFA 108 (90 BASE) MCG/ACT IN AERS: 2.0000 | INHALATION_SPRAY | Freq: Four times a day (QID) | RESPIRATORY_TRACT | Status: DC | PRN

## 2018-04-08 MED ORDER — GADOBUTROL 1 MMOL/ML IV SOLN
10.0000 mL | Freq: Once | INTRAVENOUS | Status: AC | PRN
  Administered 2018-04-08: 10 mL via INTRAVENOUS

## 2018-04-08 MED ORDER — ASPIRIN EC 81 MG PO TBEC
162.0000 mg | DELAYED_RELEASE_TABLET | Freq: Every day | ORAL | Status: DC
  Filled 2018-04-08: qty 2

## 2018-04-08 MED ORDER — AZELASTINE HCL 0.1 % NA SOLN: 2.0000 | Freq: Two times a day (BID) | NASAL | Status: DC

## 2018-04-08 MED ORDER — STROKE: EARLY STAGES OF RECOVERY BOOK
Freq: Once | Status: AC
  Administered 2018-04-08: 16:00:00
  Filled 2018-04-08: qty 1

## 2018-04-08 MED ORDER — LORATADINE 10 MG PO TABS: 10.0000 mg | ORAL_TABLET | Freq: Every day | ORAL | Status: DC

## 2018-04-08 MED ORDER — IOPAMIDOL (ISOVUE-370) INJECTION 76%
75.0000 mL | Freq: Once | INTRAVENOUS | Status: AC | PRN
  Administered 2018-04-08: 75 mL via INTRAVENOUS

## 2018-04-08 MED ORDER — ASPIRIN EC 325 MG PO TBEC
325.0000 mg | DELAYED_RELEASE_TABLET | Freq: Every day | ORAL | Status: DC
  Administered 2018-04-09: 325 mg via ORAL
  Filled 2018-04-08: qty 1

## 2018-04-08 MED ORDER — ENOXAPARIN SODIUM 40 MG/0.4ML ~~LOC~~ SOLN
40.0000 mg | SUBCUTANEOUS | Status: DC
  Administered 2018-04-08 – 2018-04-09 (×2): 40 mg via SUBCUTANEOUS
  Filled 2018-04-08 (×2): qty 0.4

## 2018-04-08 MED ORDER — PANTOPRAZOLE SODIUM 40 MG PO TBEC
40.0000 mg | DELAYED_RELEASE_TABLET | Freq: Every day | ORAL | Status: DC
  Administered 2018-04-09 – 2018-04-10 (×2): 40 mg via ORAL
  Filled 2018-04-08 (×2): qty 1

## 2018-04-08 MED ORDER — MOMETASONE FURO-FORMOTEROL FUM 100-5 MCG/ACT IN AERO
2.0000 | INHALATION_SPRAY | Freq: Two times a day (BID) | RESPIRATORY_TRACT | Status: DC
  Filled 2018-04-08: qty 8.8

## 2018-04-08 MED ORDER — ASPIRIN 81 MG PO CHEW
324.0000 mg | CHEWABLE_TABLET | Freq: Once | ORAL | Status: AC
  Administered 2018-04-08: 324 mg via ORAL
  Filled 2018-04-08: qty 4

## 2018-04-08 MED ORDER — ACETAMINOPHEN 325 MG PO TABS
650.0000 mg | ORAL_TABLET | ORAL | Status: DC | PRN
  Administered 2018-04-10: 650 mg via ORAL
  Filled 2018-04-08: qty 2

## 2018-04-08 MED ORDER — MOMETASONE FURO-FORMOTEROL FUM 100-5 MCG/ACT IN AERO: 2.0000 | INHALATION_SPRAY | Freq: Two times a day (BID) | RESPIRATORY_TRACT | Status: DC

## 2018-04-08 MED ORDER — IOPAMIDOL (ISOVUE-370) INJECTION 76%
INTRAVENOUS | Status: AC
  Filled 2018-04-08: qty 100

## 2018-04-08 MED ORDER — SIMVASTATIN 20 MG PO TABS: 20.0000 mg | ORAL_TABLET | Freq: Every day | ORAL | Status: DC

## 2018-04-08 MED ORDER — FAMOTIDINE 20 MG PO TABS: 10.0000 mg | ORAL_TABLET | Freq: Every day | ORAL | Status: DC

## 2018-04-08 MED ORDER — ACETAMINOPHEN 160 MG/5ML PO SOLN: 650.0000 mg | ORAL | Status: DC | PRN

## 2018-04-08 MED ORDER — LOSARTAN POTASSIUM 50 MG PO TABS
100.0000 mg | ORAL_TABLET | Freq: Every day | ORAL | Status: DC
  Administered 2018-04-09: 100 mg via ORAL
  Filled 2018-04-08: qty 2

## 2018-04-08 MED ORDER — FLUTICASONE PROPIONATE 50 MCG/ACT NA SUSP: 1.0000 | Freq: Every day | NASAL | Status: DC

## 2018-04-08 MED ORDER — SODIUM CHLORIDE 0.9% FLUSH: 3.0000 mL | Freq: Once | INTRAVENOUS | Status: DC

## 2018-04-08 MED ORDER — ACETAMINOPHEN 650 MG RE SUPP: 650.0000 mg | RECTAL | Status: DC | PRN

## 2018-04-08 MED ORDER — SENNOSIDES-DOCUSATE SODIUM 8.6-50 MG PO TABS: 1.0000 | ORAL_TABLET | Freq: Every evening | ORAL | Status: DC | PRN

## 2018-04-08 NOTE — Progress Notes (Signed)
Physical Therapy Evaluation Patient Details Name: Keith Black MRN: [id number] DOB: Apr 17, 1965 Today's Date: 04/08/2018   History of Present Illness  Keith Black is a 53 y.o. male with hypertension, hyperlipidemia and ocular migraines in the past.  Patient states that his ocular migraine only involves his right eye.  He stated that the symptoms were blurred vision.  Patient today came to the hospital secondary to having a 1 to 2-minute period in which he felt as though his right face and arm had decreased sensation. CT neg, MRI pending  Clinical Impression  Patient evaluated by Physical Therapy with no further acute PT needs identified. All education has been completed and the patient has no further questions. Pt presents with symptoms resolved, ambulated 500' with normal gait and pace. Ascended and descended flight of stairs with independence, HR up to 120's after stairs, quick recovery. Reviewed stroke sxs, risk factors, and healthy lifestyle.  See below for any follow-up Physical Therapy or equipment needs. PT is signing off. Thank you for this referral.     Follow Up Recommendations No PT follow up    Equipment Recommendations  None recommended by PT    Recommendations for Other Services       Precautions / Restrictions Precautions Precautions: None Restrictions Weight Bearing Restrictions: No      Mobility  Bed Mobility Overal bed mobility: Independent                Transfers Overall transfer level: Independent                  Ambulation/Gait Ambulation/Gait assistance: Independent Gait Distance (Feet): 500 Feet Assistive device: None Gait Pattern/deviations: WFL(Within Functional Limits) Gait velocity: WFL Gait velocity interpretation: >4.37 ft/sec, indicative of normal walking speed General Gait Details: fast pace, symmetrical gait, no deficits noted. Pt asymptomatic  Stairs Stairs: Yes Stairs assistance: Independent Stair Management: No  rails;Alternating pattern;Forwards Number of Stairs: 10 General stair comments: no difficulties with stairs, HR up to 120's after descent, recovery time [facility]  Wheelchair Mobility    Modified Rankin (Stroke Patients Only) Modified Rankin (Stroke Patients Only) Pre-Morbid Rankin Score: No symptoms Modified Rankin: No symptoms     Balance Overall balance assessment: Independent                                           Pertinent Vitals/Pain Pain Assessment: No/denies pain    Home Living Family/patient expects to be discharged to:: Private residence Living Arrangements: Spouse/significant other Available Help at Discharge: Family;Available PRN/intermittently Type of Home: House           Additional Comments: pt recently moved here from [place] to work for [employer]. Has had moving stress as well as holidays and work and has been traveling a lot    Prior Function Level of Independence: Independent               Journalist, newspaper        Extremity/Trunk Assessment   Upper Extremity Assessment Upper Extremity Assessment: Overall WFL for tasks assessed    Lower Extremity Assessment Lower Extremity Assessment: Overall WFL for tasks assessed    Cervical / Trunk Assessment Cervical / Trunk Assessment: Normal  Communication   Communication: No difficulties  Cognition Arousal/Alertness: Awake/alert Behavior During Therapy: WFL for tasks assessed/performed Overall Cognitive Status: Within Functional Limits for tasks assessed  General Comments General comments (skin integrity, edema, etc.): discussed CVA sxs, general health and wellness, and risk factors    Exercises     Assessment/Plan    PT Assessment Patent does not need any further PT services  PT Problem List         PT Treatment Interventions      PT Goals (Current goals can be found in the Care Plan section)  Acute Rehab PT  Goals Patient Stated Goal: return home PT Goal Formulation: All assessment and education complete, DC therapy    Frequency     Barriers to discharge        Co-evaluation               AM-PAC PT "6 Clicks" Mobility  Outcome Measure Help needed turning from your back to your side while in a flat bed without using bedrails?: None Help needed moving from lying on your back to sitting on the side of a flat bed without using bedrails?: None Help needed moving to and from a bed to a chair (including a wheelchair)?: None Help needed standing up from a chair using your arms (e.g., wheelchair or bedside chair)?: None Help needed to walk in hospital room?: None Help needed climbing 3-5 steps with a railing? : None 6 Click Score: 24    End of Session   Activity Tolerance: Patient tolerated treatment well Patient left: in chair;with call bell/phone within reach;with family/visitor present Nurse Communication: Mobility status PT Visit Diagnosis: Unsteadiness on feet (R26.81)    Time: 1515-1530 PT Time Calculation (min) (ACUTE ONLY): 15 min   Charges:   PT Evaluation $PT Eval Low Complexity: [place], PT  Acute Rehab Services  Pager [phone] Office [place] 04/08/2018, 4:10 PM

## 2018-04-08 NOTE — Consult Note (Addendum)
Neurology Consultation  Reason for Consult: Right-sided decreased sensation and weakness Referring Physician: Dr. Laren Everts  CC: Right-sided decreased sensation and weakness  History is obtained from: Patient  HPI: Keith Black is a 53 y.o. male with hypertension, hyperlipidemia and ocular migraines in the past.  Patient states that his ocular migraine only involves his right eye.  He stated that the symptoms were blurred vision.  Patient today came to the hospital secondary to having a 1 to 2-minute period in which he felt as though his right face and arm had decreased sensation.  He states that the decreased sensation actually split midline of his face.  He also noticed more weakness in his arm on the right.  All symptoms have resolved at this time.  He does not take any aspirin however does take blood pressure and high cholesterol medications and he states he takes these every day.  Due to the fact that his family has had CVAs in the past he became very worried and came to the hospital.  He does admit that he has been under significant stress at work.  On arrival to the ED his blood pressure systolically was between 595-638 and diastolic between 756-43.  While I was in the room his blood pressure was 157/95.  At this time the only complaint patient has is that he feels a little off and dizzy.  He denies vertigo.  He denies headache, diplopia, blurred vision, chest pain, palpitations, weakness, paresthesias and/or headache. Patient states he has never had any symptoms such as these in the past   LKW: 9:30 AM on 04/08/2018 tpa given?: no, symptoms resolved Premorbid modified Rankin scale (mRS): 0 NIH stroke scale of 0   ROS: A 14 point ROS was performed and is negative except as noted in the HPI.   Past Medical History:  Diagnosis Date  . High cholesterol   . Hypertension   . Ocular migraine    Family History  Problem Relation Age of Onset  . Allergies Mother   . Asthma Mother   . Heart  disease Mother    Social History:   reports that he has never smoked. He has never used smokeless tobacco. No history on file for alcohol and drug.  Medications  Current Facility-Administered Medications:  .  sodium chloride flush (NS) 0.9 % injection 3 mL, 3 mL, Intravenous, Once, Sherwood Gambler, MD  Current Outpatient Medications:  .  albuterol (PROAIR HFA) 108 (90 Base) MCG/ACT inhaler, Inhale 2 puffs into the lungs every 6 (six) hours as needed for wheezing or shortness of breath., Disp: , Rfl:  .  azelastine (ASTELIN) 0.1 % nasal spray, Place 2 sprays into both nostrils 2 (two) times daily. Use in each nostril as directed, Disp: , Rfl:  .  budesonide-formoterol (SYMBICORT) 80-4.5 MCG/ACT inhaler, Inhale 2 puffs into the lungs 2 (two) times daily., Disp: 1 Inhaler, Rfl: 11 .  cetirizine (ZYRTEC) 10 MG tablet, Take 10 mg by mouth daily., Disp: , Rfl:  .  Cholecalciferol ([employer]S VIT D 5000 HIGH-POTENCY PO), Take 1 capsule by mouth daily., Disp: , Rfl:  .  fluticasone (FLONASE) 50 MCG/ACT nasal spray, Place 1 spray into both nostrils daily., Disp: , Rfl:  .  losartan (COZAAR) 100 MG tablet, Take 100 mg by mouth daily., Disp: , Rfl:  .  pantoprazole (PROTONIX) 40 MG tablet, Take 1 tablet (40 mg total) by mouth daily., Disp: 90 tablet, Rfl: 1 .  Probiotic Product (ALIGN PO), Take 1 capsule by mouth  daily., Disp: , Rfl:  .  ranitidine (ZANTAC) 150 MG tablet, Take 150 mg by mouth daily., Disp: , Rfl:  .  simvastatin (ZOCOR) 20 MG tablet, Take 20 mg by mouth daily., Disp: , Rfl:  .  UNABLE TO FIND, Med Name: [employer], Disp: , Rfl:    Exam: Current vital signs: BP (!) 147/94   Pulse 95   Temp 97.6 F (36.4 C) (Oral)   Resp (!) 21   SpO2 100%  Vital signs in last 24 hours: Temp:  [97.6 F (36.4 C)] 97.6 F (36.4 C) (02/04 1005) Pulse Rate:  [88-115] 95 (02/04 1230) Resp:  [14-21] 21 (02/04 1230) BP: (147-183)/(94-106) 147/94 (02/04 1230) SpO2:  [100 %] 100 % (02/04 1230)  Physical  Exam  Constitutional: Appears well-developed and well-nourished.  Psych: Affect appropriate to situation Eyes: No scleral injection HENT: No OP obstrucion Head: Normocephalic.  Cardiovascular: Normal rate and regular rhythm.  Respiratory: Effort normal, non-labored breathing GI: Soft.  No distension. There is no tenderness.  Skin: WDI  Neuro: Mental Status: Patient is awake, alert, oriented to person, place, month, year, and situation. Patient is able to give a clear and coherent history. No signs of aphasia or neglect Cranial Nerves: II: Visual Fields are full.    III,IV, VI: EOMI without ptosis or diploplia. Pupils are equal, round, and reactive to light. V: Facial sensation is symmetric to temperature VII: Facial movement is symmetric.  VIII: hearing is intact to voice X: Uvula elevates symmetrically XI: Shoulder shrug is symmetric. XII: tongue is midline without atrophy or fasciculations.  Motor: Tone is normal. Bulk is normal. 5/5 strength was present in all four extremities.  Sensory: Sensation is symmetric to light touch and temperature in the arms and legs. Deep Tendon Reflexes: 2+ and symmetric in the biceps and patellae.  Plantars: Toes are downgoing bilaterally.  Cerebellar: FNF and HKS are intact bilaterally  Labs I have reviewed labs in epic and the results pertinent to this consultation are:   CBC    Component Value Date/Time   WBC 6.4 04/08/2018 1024   RBC 4.97 04/08/2018 1024   HGB 14.7 04/08/2018 1024   HCT 44.5 04/08/2018 1024   PLT 334 04/08/2018 1024   MCV 89.5 04/08/2018 1024   MCH 29.6 04/08/2018 1024   MCHC 33.0 04/08/2018 1024   RDW 11.9 04/08/2018 1024   LYMPHSABS 1.8 04/08/2018 1024   MONOABS 0.7 04/08/2018 1024   EOSABS 0.2 04/08/2018 1024   BASOSABS 0.1 04/08/2018 1024    CMP     Component Value Date/Time   NA 137 04/08/2018 1024   K 4.6 04/08/2018 1024   CL 102 04/08/2018 1024   CO2 24 04/08/2018 1024   GLUCOSE 114 (H)  04/08/2018 1024   BUN 14 04/08/2018 1024   CREATININE 1.02 04/08/2018 1024   CALCIUM 9.2 04/08/2018 1024   PROT 7.9 04/08/2018 1024   ALBUMIN 4.5 04/08/2018 1024   AST 28 04/08/2018 1024   ALT 26 04/08/2018 1024   ALKPHOS 50 04/08/2018 1024   BILITOT 0.8 04/08/2018 1024   GFRNONAA >60 04/08/2018 1024   GFRAA >60 04/08/2018 1024    Lipid Panel  No results found for: CHOL, TRIG, HDL, CHOLHDL, VLDL, LDLCALC, LDLDIRECT   Imaging I have reviewed the images obtained:  CT-scan of the brain-no intracranial abnormalities  MRI examination of the brain-pending  Etta Quill PA-C [facility] [phone] 04/08/2018, 1:32 PM     Assessment: 53 year old male with transient paresthesias and weakness  of right face and arm.  Symptoms had not fully resolved at the time of PA assessment but were no longer present at time of Neurology attending evaluation.   1. Cannot rule out TIA given his stroke risk factors of hyperlipidemia and hypertension. However, cannot rule out stress related symptoms also.  2. 2. Blood pressure was slightly elevated and not consistent with hypertensive encephalopathy.  Recommendations: # MRI of the brain without contrast # CTA head and neck # Transthoracic Echo, # Start patient on ASA 325 mg daily,  # Continue simvastatin # BP goal: permissive HTN up to 220/120 mmHg # HBAIC and Lipid profile # Telemetry monitoring # Frequent neuro checks # NPO until passes stroke swallow screen # Please page stroke NP  Or  PA  Or MD from 8am -4 pm  as this patient from this time will be  followed by the stroke.   You can look them up on [email or web address]  Password [email or web address]   Electronically signed: Dr. Kerney Elbe

## 2018-04-08 NOTE — ED Notes (Signed)
Pt given food and beverage per Dr. Regenia Skeeter

## 2018-04-08 NOTE — Progress Notes (Signed)
Patient arrived to 3w31. A&O x4. Patient states no pain. Wife at bedside. No skin issues. Nurse will continue to monitor. [place]

## 2018-04-08 NOTE — ED Triage Notes (Signed)
Pt presents for evaluation of R sided facial numbness and arm numbness with facial and arm weakness starting around 0930 and lasting for approximately 1 minute. Pt reports resolution of symptoms at this time.

## 2018-04-08 NOTE — ED Provider Notes (Signed)
[facility] EMERGENCY DEPARTMENT Provider Note   CSN: [id number] Arrival date & time: 04/08/18  1001     History   Chief Complaint Chief Complaint  Patient presents with  . Stroke Symptoms    HPI Klaus Casteneda is a 53 y.o. male.  HPI  53 year old male with a history of hypertension and hyperlipidemia presents with transient weakness and numbness.  He states that around 930 he was on the computer and all of a sudden his right face and right arm felt numb.  He was able to move his arm but it was very heavy and he still could not feel it.  He did not try and talk during this time.  There was no associated headache.  No leg symptoms.  Resolved within about a minute but ever since has had intermittent tingling to the right side of his head and right arm/hand.  No further weakness.  He denies any chest pain.  Past Medical History:  Diagnosis Date  . High cholesterol   . Hypertension     Patient Active Problem List   Diagnosis Date Noted  . TIA (transient ischemic attack) 04/08/2018  . Cough variant asthma 11/13/2017    History reviewed. No pertinent surgical history.      Home Medications    Prior to Admission medications   Medication Sig Start Date End Date Taking? Authorizing Provider  albuterol (PROAIR HFA) 108 (90 Base) MCG/ACT inhaler Inhale 2 puffs into the lungs every 6 (six) hours as needed for wheezing or shortness of breath.    [provider]  azelastine (ASTELIN) 0.1 % nasal spray Place 2 sprays into both nostrils 2 (two) times daily. Use in each nostril as directed    [provider]  budesonide-formoterol (SYMBICORT) 80-4.5 MCG/ACT inhaler Inhale 2 puffs into the lungs 2 (two) times daily. 12/16/17   Tanda Rockers, MD  cetirizine (ZYRTEC) 10 MG tablet Take 10 mg by mouth daily.    [provider]  Cholecalciferol ([employer]S VIT D 5000 HIGH-POTENCY PO) Take 1 capsule by mouth daily.    [provider]  fluticasone  (FLONASE) 50 MCG/ACT nasal spray Place 1 spray into both nostrils daily.    [provider]  losartan (COZAAR) 100 MG tablet Take 100 mg by mouth daily.    [provider]  pantoprazole (PROTONIX) 40 MG tablet Take 1 tablet (40 mg total) by mouth daily. 12/16/17   Tanda Rockers, MD  Probiotic Product (ALIGN PO) Take 1 capsule by mouth daily.    [provider]  ranitidine (ZANTAC) 150 MG tablet Take 150 mg by mouth daily.    [provider]  simvastatin (ZOCOR) 20 MG tablet Take 20 mg by mouth daily.    [provider]  UNABLE TO FIND Med Name: [facility]    [provider]    Family History Family History  Problem Relation Age of Onset  . Allergies Mother   . Asthma Mother   . Heart disease Mother     Social History Social History   Tobacco Use  . Smoking status: Never Smoker  . Smokeless tobacco: Never Used  Substance Use Topics  . Alcohol use: Not on file  . Drug use: Not on file     Allergies   Patient has no known allergies.   Review of Systems Review of Systems  Constitutional: Negative for fever.  Cardiovascular: Negative for chest pain.  Neurological: Positive for weakness and numbness. Negative for  headaches.  All other systems reviewed and are negative.    Physical Exam Updated Vital Signs BP (!) 147/94   Pulse 95   Temp 97.6 F (36.4 C) (Oral)   Resp (!) 21   SpO2 100%   Physical Exam Vitals signs and nursing note reviewed.  Constitutional:      Appearance: He is well-developed.  HENT:     Head: Normocephalic and atraumatic.     Right Ear: External ear normal.     Left Ear: External ear normal.     Nose: Nose normal.  Eyes:     General:        Right eye: No discharge.        Left eye: No discharge.     Extraocular Movements: Extraocular movements intact.     Pupils: Pupils are equal, round, and reactive to light.  Neck:     Musculoskeletal: Neck supple.  Cardiovascular:     Rate and  Rhythm: Regular rhythm. Tachycardia present.     Heart sounds: Normal heart sounds.  Pulmonary:     Effort: Pulmonary effort is normal.     Breath sounds: Normal breath sounds.  Abdominal:     Palpations: Abdomen is soft.     Tenderness: There is no abdominal tenderness.  Skin:    General: Skin is warm and dry.  Neurological:     Mental Status: He is alert.     Comments: CN 3-12 grossly intact. 5/5 strength in all 4 extremities. Grossly normal sensation. Normal finger to nose.   Psychiatric:        Mood and Affect: Mood is anxious.      ED Treatments / Results  Labs (all labs ordered are listed, but only abnormal results are displayed) Labs Reviewed  COMPREHENSIVE METABOLIC PANEL - Abnormal; Notable for the following components:      Result Value   Glucose, Bld 114 (*)    All other components within normal limits  CBG MONITORING, ED - Abnormal; Notable for the following components:   Glucose-Capillary 102 (*)    All other components within normal limits  PROTIME-INR  APTT  CBC  DIFFERENTIAL  I-STAT TROPONIN, ED    EKG EKG Interpretation  Date/Time:  Tuesday April 08 2018 10:17:25 EST Ventricular Rate:  119 PR Interval:  146 QRS Duration: 74 QT Interval:  306 QTC Calculation: 430 R Axis:   35 Text Interpretation:  Sinus tachycardia Anterior infarct , age undetermined Abnormal ECG No old tracing to compare Confirmed by Sherwood Gambler [phone]) on 04/08/2018 10:24:22 AM   Radiology Ct Head Wo Contrast  Result Date: 04/08/2018 CLINICAL DATA:  Right sided numbness EXAM: CT HEAD WITHOUT CONTRAST TECHNIQUE: Contiguous axial images were obtained from the base of the skull through the vertex without intravenous contrast. COMPARISON:  None. FINDINGS: Brain: The brain shows a normal appearance without evidence of malformation, atrophy, old or acute small or large vessel infarction, mass lesion, hemorrhage, hydrocephalus or extra-axial collection. Vascular: No hyperdense  vessel. No evidence of atherosclerotic calcification. Skull: Normal.  No traumatic finding.  No focal bone lesion. Sinuses/Orbits: Sinuses are clear. Orbits appear normal. Mastoids are clear. Other: None significant IMPRESSION: Normal head CT Electronically Signed   By: Nelson Chimes M.D.   On: 04/08/2018 11:26    Procedures Procedures (including critical care time)  Medications Ordered in ED Medications  sodium chloride flush (NS) 0.9 % injection 3 mL (has no administration in time range)  aspirin chewable tablet 324 mg (324 mg  Oral Given 04/08/18 1157)     Initial Impression / Assessment and Plan / ED Course  I have reviewed the triage vital signs and the nursing notes.  Pertinent labs & imaging results that were available during my care of the patient were reviewed by me and considered in my medical decision making (see chart for details).     Presentation is consistent with TIA.  No acute neurologic symptoms at this time.  Initial work-up is benign.  Discussed with Dr. Cheral Marker who will see.  Patient recently moved to this area and does not have a PCP.  I feel he will need TIA work-up and given this cannot be done speedily as an outpatient he will be brought into the hospital for work-up.  Hospitalist to admit.  Final Clinical Impressions(s) / ED Diagnoses   Final diagnoses:  TIA (transient ischemic attack)    ED Discharge Orders    None       Sherwood Gambler, MD 04/08/18 1322

## 2018-04-08 NOTE — ED Triage Notes (Signed)
PT reports numbness to face Rt side of faced and Rt arm . Pt reports he could move the arm. Pt now has tingling to rt side of face ,rt arm and a HA.

## 2018-04-08 NOTE — H&P (Signed)
[facility]                                                                                    Patient Demographics  Keith Black, is a 53 y.o. male  CSN: [id number]  MRN: [id number]  DOB - February 01, 1966  Admit Date - 04/08/2018  Outpatient Primary MD for the patient is Patient, [name]   With History of -  History reviewed. No pertinent past medical history.    History reviewed. No pertinent surgical history.  in for   Chief Complaint  Patient presents with  . Stroke Symptoms     HPI  Keith Black  is a 53 y.o. male, with past medical history significant for hypertension and hyperlipidemia presenting today with an episode of right facial and right arm numbness and weakness that lasted around 1 minute.  Patient was concerned because of family history of CVA.  Symptoms returned back to normal.  No history of head trauma chest pains or palpitations.  No nausea vomiting or diarrhea.  No similar previous episodes In the emergency room work-up was negative with a negative CT of the head and labs Blood pressure is on the high side. Neurology consult is pending.    Review of Systems    In addition to the HPI above,  No Fever-chills, No Headache, No changes with Vision or hearing, No problems swallowing food or Liquids, No Chest pain, Cough or Shortness of Breath, No Abdominal pain, No Nausea or Vommitting, Bowel movements are regular, No Blood in stool or Urine, No dysuria, No new skin rashes or bruises, No new joints pains-aches,  No new weakness, tingling, numbness in any extremity, No recent weight gain or loss, No polyuria, polydypsia or polyphagia,   A full 10 point Review of Systems was done, except as stated above, all other Review of Systems were negative.   Social History Social History   Tobacco Use  . Smoking status: Never Smoker  . Smokeless tobacco: Never Used  Substance Use Topics  . Alcohol use: Not on file     Family  History Family History  Problem Relation Age of Onset  . Allergies Mother   . Asthma Mother   . Heart disease Mother      Prior to Admission medications   Medication Sig Start Date End Date Taking? Authorizing Provider  albuterol (PROAIR HFA) 108 (90 Base) MCG/ACT inhaler Inhale 2 puffs into the lungs every 6 (six) hours as needed for wheezing or shortness of breath.    [provider]  azelastine (ASTELIN) 0.1 % nasal spray Place 2 sprays into both nostrils 2 (two) times daily. Use in each nostril as directed    [provider]  budesonide-formoterol (SYMBICORT) 80-4.5 MCG/ACT inhaler Inhale 2 puffs into the lungs 2 (two) times daily. 12/16/17   Tanda Rockers, MD  cetirizine (ZYRTEC) 10 MG tablet Take 10 mg by mouth daily.    [provider]  Cholecalciferol (CVS VIT D 5000 HIGH-POTENCY PO) Take 1 capsule by mouth daily.    [provider]  fluticasone (FLONASE) 50 MCG/ACT nasal spray Place 1 spray into both nostrils daily.  [provider]  losartan (COZAAR) 100 MG tablet Take 100 mg by mouth daily.    [provider]  pantoprazole (PROTONIX) 40 MG tablet Take 1 tablet (40 mg total) by mouth daily. 12/16/17   Tanda Rockers, MD  Probiotic Product (ALIGN PO) Take 1 capsule by mouth daily.    [provider]  ranitidine (ZANTAC) 150 MG tablet Take 150 mg by mouth daily.    [provider]  simvastatin (ZOCOR) 20 MG tablet Take 20 mg by mouth daily.    [provider]  UNABLE TO FIND Med Name: [facility]    [provider]    No Known Allergies  Physical Exam  Vitals  Blood pressure (!) 160/94, pulse 88, temperature 97.6 F (36.4 C), temperature source Oral, resp. rate 19, SpO2 100 %.   1. General well-developed, well-nourished extremely pleasant male in no acute distress  2. Normal affect and insight, Not Suicidal or Homicidal, Awake Alert, Oriented X 3.  3. No F.N deficits,.  4. Ears  and Eyes appear Normal, Conjunctivae clear, PERRLA. Moist Oral Mucosa.  5. Supple Neck, No JVD, No cervical lymphadenopathy appriciated, No Carotid Bruits.  6. Symmetrical Chest wall movement, Good air movement bilaterally, CTAB.  7. RRR, No Gallops, Rubs or Murmurs, No Parasternal Heave.  8. Positive Bowel Sounds, Abdomen Soft, Non tender, No organomegaly appriciated,No rebound -guarding or rigidity.  9.  No Cyanosis, Normal Skin Turgor, No Skin Rash or Bruise.  10. Good muscle tone,  joints appear normal , no effusions, Normal ROM.    Data Review  CBC Recent Labs  Lab 04/08/18 1024  WBC 6.4  HGB 14.7  HCT 44.5  PLT 334  MCV 89.5  MCH 29.6  MCHC 33.0  RDW 11.9  LYMPHSABS 1.8  MONOABS 0.7  EOSABS 0.2  BASOSABS 0.1   ------------------------------------------------------------------------------------------------------------------  Chemistries  Recent Labs  Lab 04/08/18 1024  NA 137  K 4.6  CL 102  CO2 24  GLUCOSE 114*  BUN 14  CREATININE 1.02  CALCIUM 9.2  AST 28  ALT 26  ALKPHOS 50  BILITOT 0.8   ------------------------------------------------------------------------------------------------------------------ CrCl cannot be calculated (Unknown ideal weight.). ------------------------------------------------------------------------------------------------------------------ No results for input(s): TSH, T4TOTAL, T3FREE, THYROIDAB in the last 72 hours.  Invalid input(s): FREET3   Coagulation profile Recent Labs  Lab 04/08/18 1024  INR 0.97   ------------------------------------------------------------------------------------------------------------------- No results for input(s): DDIMER in the last 72 hours. -------------------------------------------------------------------------------------------------------------------  Cardiac Enzymes No results for input(s): CKMB, TROPONINI, MYOGLOBIN in the last 168 hours.  Invalid input(s):  CK ------------------------------------------------------------------------------------------------------------------ Invalid input(s): POCBNP   ---------------------------------------------------------------------------------------------------------------  Urinalysis No results found for: COLORURINE, APPEARANCEUR, LABSPEC, PHURINE, GLUCOSEU, HGBUR, BILIRUBINUR, KETONESUR, PROTEINUR, UROBILINOGEN, NITRITE, LEUKOCYTESUR  ----------------------------------------------------------------------------------------------------------------   Imaging results:   Ct Head Wo Contrast  Result Date: 04/08/2018 CLINICAL DATA:  Right sided numbness EXAM: CT HEAD WITHOUT CONTRAST TECHNIQUE: Contiguous axial images were obtained from the base of the skull through the vertex without intravenous contrast. COMPARISON:  None. FINDINGS: Brain: The brain shows a normal appearance without evidence of malformation, atrophy, old or acute small or large vessel infarction, mass lesion, hemorrhage, hydrocephalus or extra-axial collection. Vascular: No hyperdense vessel. No evidence of atherosclerotic calcification. Skull: Normal.  No traumatic finding.  No focal bone lesion. Sinuses/Orbits: Sinuses are clear. Orbits appear normal. Mastoids are clear. Other: None significant IMPRESSION: Normal head CT Electronically Signed   By: Nelson Chimes M.D.   On: 04/08/2018 11:26    My personal review of EKG: Sinus  tachycardia at 118/min  Assessment & Plan  TIA , CT of the brain is normal Patient back to normal Hemoglobin A1c Neurochecks Awaiting neurology input  Hypertension A little on the high side.  We will keep it at this level and will try not to overcorrect  Hyperlipidemia On Zocor; check lipid profile  Reactive airway disease Continue with Symbicort   DVT Prophylaxis   AM Labs Ordered, also please review Full Orders  Family Communication: Admission, patients condition and plan of care including tests being  ordered have been discussed with the patient and wife who indicate understanding and agree with the plan and Code Status.  Code Status full  Disposition Plan:   Time spent in minutes : 38 minutes  Condition GUARDED   @SIGNATURE @

## 2018-04-09 ENCOUNTER — Other Ambulatory Visit: Payer: Self-pay

## 2018-04-09 ENCOUNTER — Inpatient Hospital Stay (HOSPITAL_COMMUNITY): Payer: BLUE CROSS/BLUE SHIELD

## 2018-04-09 ENCOUNTER — Observation Stay (HOSPITAL_COMMUNITY): Payer: BLUE CROSS/BLUE SHIELD

## 2018-04-09 ENCOUNTER — Encounter (HOSPITAL_COMMUNITY): Payer: Self-pay | Admitting: *Deleted

## 2018-04-09 DIAGNOSIS — I639 Cerebral infarction, unspecified: Secondary | ICD-10-CM | POA: Diagnosis not present

## 2018-04-09 DIAGNOSIS — R402252 Coma scale, best verbal response, oriented, at arrival to emergency department: Secondary | ICD-10-CM | POA: Diagnosis present

## 2018-04-09 DIAGNOSIS — G8191 Hemiplegia, unspecified affecting right dominant side: Secondary | ICD-10-CM | POA: Diagnosis present

## 2018-04-09 DIAGNOSIS — Z79899 Other long term (current) drug therapy: Secondary | ICD-10-CM | POA: Diagnosis not present

## 2018-04-09 DIAGNOSIS — G459 Transient cerebral ischemic attack, unspecified: Secondary | ICD-10-CM

## 2018-04-09 DIAGNOSIS — I519 Heart disease, unspecified: Secondary | ICD-10-CM | POA: Diagnosis not present

## 2018-04-09 DIAGNOSIS — J45909 Unspecified asthma, uncomplicated: Secondary | ICD-10-CM | POA: Diagnosis present

## 2018-04-09 DIAGNOSIS — I5022 Chronic systolic (congestive) heart failure: Secondary | ICD-10-CM | POA: Diagnosis present

## 2018-04-09 DIAGNOSIS — I5021 Acute systolic (congestive) heart failure: Secondary | ICD-10-CM

## 2018-04-09 DIAGNOSIS — I6300 Cerebral infarction due to thrombosis of unspecified precerebral artery: Secondary | ICD-10-CM | POA: Diagnosis not present

## 2018-04-09 DIAGNOSIS — R200 Anesthesia of skin: Secondary | ICD-10-CM | POA: Diagnosis present

## 2018-04-09 DIAGNOSIS — R402362 Coma scale, best motor response, obeys commands, at arrival to emergency department: Secondary | ICD-10-CM | POA: Diagnosis present

## 2018-04-09 DIAGNOSIS — I63412 Cerebral infarction due to embolism of left middle cerebral artery: Secondary | ICD-10-CM | POA: Diagnosis present

## 2018-04-09 DIAGNOSIS — I513 Intracardiac thrombosis, not elsewhere classified: Secondary | ICD-10-CM | POA: Diagnosis not present

## 2018-04-09 DIAGNOSIS — Z8249 Family history of ischemic heart disease and other diseases of the circulatory system: Secondary | ICD-10-CM | POA: Diagnosis not present

## 2018-04-09 DIAGNOSIS — K219 Gastro-esophageal reflux disease without esophagitis: Secondary | ICD-10-CM | POA: Diagnosis present

## 2018-04-09 DIAGNOSIS — G43809 Other migraine, not intractable, without status migrainosus: Secondary | ICD-10-CM | POA: Diagnosis present

## 2018-04-09 DIAGNOSIS — I2510 Atherosclerotic heart disease of native coronary artery without angina pectoris: Secondary | ICD-10-CM | POA: Diagnosis present

## 2018-04-09 DIAGNOSIS — E785 Hyperlipidemia, unspecified: Secondary | ICD-10-CM | POA: Diagnosis present

## 2018-04-09 DIAGNOSIS — I5020 Unspecified systolic (congestive) heart failure: Secondary | ICD-10-CM | POA: Diagnosis present

## 2018-04-09 DIAGNOSIS — R29700 NIHSS score 0: Secondary | ICD-10-CM | POA: Diagnosis present

## 2018-04-09 DIAGNOSIS — F17220 Nicotine dependence, chewing tobacco, uncomplicated: Secondary | ICD-10-CM | POA: Diagnosis present

## 2018-04-09 DIAGNOSIS — I429 Cardiomyopathy, unspecified: Secondary | ICD-10-CM | POA: Diagnosis present

## 2018-04-09 DIAGNOSIS — R402142 Coma scale, eyes open, spontaneous, at arrival to emergency department: Secondary | ICD-10-CM | POA: Diagnosis present

## 2018-04-09 DIAGNOSIS — I10 Essential (primary) hypertension: Secondary | ICD-10-CM | POA: Diagnosis present

## 2018-04-09 DIAGNOSIS — I110 Hypertensive heart disease with heart failure: Secondary | ICD-10-CM | POA: Diagnosis present

## 2018-04-09 DIAGNOSIS — I240 Acute coronary thrombosis not resulting in myocardial infarction: Secondary | ICD-10-CM | POA: Diagnosis present

## 2018-04-09 DIAGNOSIS — Z823 Family history of stroke: Secondary | ICD-10-CM | POA: Diagnosis not present

## 2018-04-09 DIAGNOSIS — E7800 Pure hypercholesterolemia, unspecified: Secondary | ICD-10-CM | POA: Diagnosis present

## 2018-04-09 DIAGNOSIS — R29810 Facial weakness: Secondary | ICD-10-CM | POA: Diagnosis present

## 2018-04-09 DIAGNOSIS — I25110 Atherosclerotic heart disease of native coronary artery with unstable angina pectoris: Secondary | ICD-10-CM | POA: Diagnosis not present

## 2018-04-09 DIAGNOSIS — R002 Palpitations: Secondary | ICD-10-CM | POA: Diagnosis present

## 2018-04-09 DIAGNOSIS — Z7951 Long term (current) use of inhaled steroids: Secondary | ICD-10-CM | POA: Diagnosis not present

## 2018-04-09 DIAGNOSIS — I6389 Other cerebral infarction: Secondary | ICD-10-CM | POA: Diagnosis not present

## 2018-04-09 LAB — LIPID PANEL
Cholesterol: 215 mg/dL — ABNORMAL HIGH (ref 0–200)
HDL: 59 mg/dL (ref >40)
LDL Cholesterol: 116 mg/dL — ABNORMAL HIGH (ref 0–99)
TRIGLYCERIDES: 201 mg/dL — AB (ref <150)
Total CHOL/HDL Ratio: 3.6 RATIO
VLDL: 40 mg/dL (ref 0–40)

## 2018-04-09 LAB — HEMOGLOBIN A1C
Hgb A1c MFr Bld: 5.7 % — ABNORMAL HIGH (ref 4.8–5.6)
Mean Plasma Glucose: 116.89 mg/dL

## 2018-04-09 LAB — ECHOCARDIOGRAM COMPLETE
Height: 68 in
Weight: 2821.89 oz

## 2018-04-09 LAB — HIV ANTIBODY (ROUTINE TESTING W REFLEX): HIV Screen 4th Generation wRfx: NONREACTIVE

## 2018-04-09 LAB — HEPARIN LEVEL (UNFRACTIONATED): Heparin Unfractionated: 0.46 IU/mL (ref 0.30–0.70)

## 2018-04-09 LAB — SURGICAL PCR SCREEN
MRSA, PCR: NEGATIVE
Staphylococcus aureus: NEGATIVE

## 2018-04-09 MED ORDER — SIMVASTATIN 20 MG PO TABS
40.0000 mg | ORAL_TABLET | Freq: Every day | ORAL | Status: DC
  Administered 2018-04-09: 40 mg via ORAL
  Filled 2018-04-09: qty 2

## 2018-04-09 MED ORDER — MUPIROCIN 2 % EX OINT
1.0000 "application " | TOPICAL_OINTMENT | Freq: Two times a day (BID) | CUTANEOUS | Status: DC
  Administered 2018-04-09 – 2018-04-10 (×2): 1 via NASAL
  Filled 2018-04-09: qty 22

## 2018-04-09 MED ORDER — LOSARTAN POTASSIUM 50 MG PO TABS: 100.0000 mg | ORAL_TABLET | Freq: Every day | ORAL | Status: DC

## 2018-04-09 MED ORDER — LOSARTAN POTASSIUM 50 MG PO TABS: 50.0000 mg | ORAL_TABLET | Freq: Every day | ORAL | Status: DC

## 2018-04-09 MED ORDER — HEPARIN (PORCINE) 25000 UT/250ML-% IV SOLN
650.0000 [IU]/h | INTRAVENOUS | Status: DC
  Administered 2018-04-09: 650 [IU]/h via INTRAVENOUS
  Filled 2018-04-09: qty 250

## 2018-04-09 MED ORDER — ASPIRIN EC 81 MG PO TBEC
81.0000 mg | DELAYED_RELEASE_TABLET | Freq: Every day | ORAL | Status: DC
  Filled 2018-04-09: qty 1

## 2018-04-09 MED ORDER — CLOPIDOGREL BISULFATE 75 MG PO TABS
75.0000 mg | ORAL_TABLET | Freq: Every day | ORAL | Status: DC
  Administered 2018-04-09: 75 mg via ORAL
  Filled 2018-04-09 (×2): qty 1

## 2018-04-09 MED ORDER — PERFLUTREN LIPID MICROSPHERE
1.0000 mL | INTRAVENOUS | Status: AC | PRN
  Administered 2018-04-09: 3 mL via INTRAVENOUS
  Filled 2018-04-09: qty 10

## 2018-04-09 MED ORDER — PERFLUTREN LIPID MICROSPHERE
INTRAVENOUS | Status: AC
  Administered 2018-04-09: 3 mL via INTRAVENOUS
  Filled 2018-04-09: qty 10

## 2018-04-09 MED ORDER — LORAZEPAM 0.5 MG PO TABS
0.5000 mg | ORAL_TABLET | Freq: Four times a day (QID) | ORAL | Status: DC | PRN
  Administered 2018-04-09 – 2018-04-10 (×3): 0.5 mg via ORAL
  Filled 2018-04-09 (×3): qty 1

## 2018-04-09 MED ORDER — METOPROLOL TARTRATE 50 MG PO TABS
50.0000 mg | ORAL_TABLET | Freq: Two times a day (BID) | ORAL | Status: DC
  Administered 2018-04-09 – 2018-04-10 (×2): 50 mg via ORAL
  Filled 2018-04-09 (×2): qty 1

## 2018-04-09 MED ORDER — GADOBUTROL 1 MMOL/ML IV SOLN
9.0000 mL | Freq: Once | INTRAVENOUS | Status: AC | PRN
  Administered 2018-04-09: 9 mL via INTRAVENOUS

## 2018-04-09 NOTE — Progress Notes (Signed)
ANTICOAGULATION CONSULT NOTE - Follow Up Consult  Pharmacy Consult for heparin dosing  Indication: CVA  No Known Allergies  Patient Measurements: Height: 5\' 8"  (172.7 cm) Weight: 176 lb 5.9 oz (80 kg) IBW/kg (Calculated) : 68.4 Heparin Dosing Weight: 80kg  Vital Signs: Temp: 98.2 F (36.8 C) (02/05 2027) Temp Source: Oral (02/05 2027) BP: 111/64 (02/05 2027) Pulse Rate: 76 (02/05 2027)  Labs: Recent Labs    04/08/18 1024 04/09/18 2123  HGB 14.7  --   HCT 44.5  --   PLT 334  --   APTT 27  --   LABPROT 12.8  --   INR 0.97  --   HEPARINUNFRC  --  0.46  CREATININE 1.02  --     Estimated Creatinine Clearance: 82 mL/min (by C-G formula based on SCr of 1.02 mg/dL).   Medical History: Past Medical History:  Diagnosis Date  . CVA (cerebrovascular accident) ([place]) 04/08/2018  . GERD (gastroesophageal reflux disease)   . High cholesterol   . Hypertension   . LV (left ventricular) mural thrombus without MI   . Ocular migraine     Assessment: 53 yo male admitted with complaints of right sided facial numbness and arm numbness. PMH includes hypertension and hyperlipidemia. ECHO completed on 2/5 with findings of small thrombus on the apical wall of the left ventricle. Pharmacy consulted to start heparin for heart clot/CVA.   Currently on IV heparin at 650 units/hr and initial HL is therapeutic at 0.46. No s/s of bleeding per RN  Goal of Therapy:  Heparin level goal 0.3-0.5 Monitor platelets by anticoagulation protocol: Yes   Plan:  Continue heparin infusion at 650 units/hr F/u AM confirmatory heparin level  Continue to monitor H&H and platelets  To stop in AM for surgery   Albertina Parr, PharmD., BCPS Clinical Pharmacist Clinical phone for 04/09/18 until 10:30pm: [phone] If after 10:30pm, please refer to [facility] for unit-specific pharmacist

## 2018-04-09 NOTE — Evaluation (Signed)
Occupational Therapy Evaluation Patient Details Name: Keith Black MRN: [id number] DOB: [birth year] Today's Date: 04/09/2018    History of Present Illness Keith Black is a 53 y.o. male with hypertension, hyperlipidemia and ocular migraines in the past.  Patient states that his ocular migraine only involves his right eye.  He stated that the symptoms were blurred vision.  Patient today came to the hospital secondary to having a 1 to 2-minute period in which he felt as though his right face and arm had decreased sensation. CT neg, MRI pending   Clinical Impression   Pt admitted with above s/s. Pt now presenting at baseline levels with ADLs and functional mobility. Provided edu to pt and wife re CVA s/s and general safety awareness- both appreciative and asking questions, all answered. OT will sign off- appreciate the referral.     Follow Up Recommendations  No OT follow up    Equipment Recommendations  None recommended by OT    Recommendations for Other Services  None     Precautions / Restrictions Precautions Precautions: None Restrictions Weight Bearing Restrictions: No      Mobility Bed Mobility Overal bed mobility: Independent                Transfers Overall transfer level: Independent                    Balance Overall balance assessment: Independent                                         ADL either performed or assessed with clinical judgement   ADL Overall ADL's : At baseline;Independent                                             Vision Baseline Vision/History: Wears glasses Patient Visual Report: No change from baseline Vision Assessment?: No apparent visual deficits     Perception  WFL   Praxis  [facility]    Pertinent Vitals/Pain Pain Assessment: No/denies pain     Hand Dominance Right   Extremity/Trunk Assessment Upper Extremity Assessment Upper Extremity Assessment: Overall WFL for tasks assessed    Lower Extremity Assessment Lower Extremity Assessment: Overall WFL for tasks assessed   Cervical / Trunk Assessment Cervical / Trunk Assessment: Normal   Communication Communication Communication: No difficulties   Cognition Arousal/Alertness: Awake/alert Behavior During Therapy: WFL for tasks assessed/performed Overall Cognitive Status: Within Functional Limits for tasks assessed                                     General Comments  Provided edu to pt and wife re future risk of CVA, s/s, energy conservation, and safety awareness            Home Living Family/patient expects to be discharged to:: Private residence Living Arrangements: Spouse/significant other Available Help at Discharge: Family;Available PRN/intermittently Type of Home: House                           Additional Comments: pt recently moved here from [place] to work for [employer]. Has had moving stress as well as holidays and work and has been  traveling a lot      Prior Functioning/Environment Level of Independence: Independent                 OT Problem List: Decreased safety awareness      OT Treatment/Interventions:      OT Goals(Current goals can be found in the care plan section) Acute Rehab OT Goals Patient Stated Goal: return home OT Goal Formulation: All assessment and education complete, DC therapy  OT Frequency:      AM-PAC OT "6 Clicks" Daily Activity     Outcome Measure Help from another person eating meals?: None Help from another person taking care of personal grooming?: None Help from another person toileting, which includes using toliet, bedpan, or urinal?: None Help from another person bathing (including washing, rinsing, drying)?: None Help from another person to put on and taking off regular upper body clothing?: None Help from another person to put on and taking off regular lower body clothing?: None 6 Click Score: 24   End of Session Nurse  Communication: Mobility status  Activity Tolerance: Patient tolerated treatment well Patient left: in bed;with family/visitor present  OT Visit Diagnosis: Muscle weakness (generalized) (M62.81)                Time: 6712-4580 OT Time Calculation (min): 10 min Charges:  OT General Charges $OT Visit: 1 Visit OT Evaluation $OT Eval Low Complexity: 1 Low  Keith Black OTR/L  04/09/2018, 7:56 AM

## 2018-04-09 NOTE — Progress Notes (Signed)
  Echocardiogram Echocardiogram Stress Test has been performed.  Keith Black 04/09/2018, 12:34 PM

## 2018-04-09 NOTE — Progress Notes (Addendum)
[facility] PROGRESS NOTE  Keith Black LNL:[id number] DOB: 05/28/1965 DOA: 04/08/2018 PCP: Patient, [name]  Assessment/Plan:  #1. Cva -Presented with right sided decreased sensation and weakness. Symptoms quickly resolved. MRI acute left cortical lateral precentral gyrus infarct. CT head normal. TTE reveals left ventricular thrombus, LDL 116, HgA1c 5.7. -neurology consult appreciated -heparin gtt initiated -increased statin -cardiology consult requested  #2. LV thrombus. Echo reveals small thrombus apical wall left ventricle, left ventricle moderately reduced systolic function. -heparin gtt -cardiology consult requested  #3. Htn. Fair control. Home meds include losartan -resume losartan over next few days  #4. Hyperlipidemia. Lipid panel as noted above -increase home statin  Code Status: full Family Communication: wife at bedside Disposition Plan: home when ready   Consultants:  [facility] stroke team  cardiology  Procedures:  echo    HPI/Subjective: 53 yo presented with stroke like symptoms resolved.  workup initiated revealing acute L precentral gyrus infarct as well as LV thrombus.  Denies pain/discomfort. Reports symptoms have been resolved and not reoccurred.   Objective: Vitals:   04/09/18 0739 04/09/18 1400  BP: (!) 141/80 138/79  Pulse: 90 91  Resp: 20 20  Temp: (!) 97.4 F (36.3 C) (!) 97.4 F (36.3 C)  SpO2: 100% 100%   No intake or output data in the 24 hours ending 04/09/18 1502 Filed Weights   04/09/18 4081  Weight: 80 kg    Exam:   General:  Well nourished somewhat anxious, no acute distress  Cardiovascular: rrr no mgr no LE edema  Respiratory: normal effort BS clear bilaterally no wheeze  Abdomen: soft non-distended +BS no guarding or rebounding  Musculoskeletal: joints without swelling/erythema  Neuro: oriented x3, speech clear, tongue midline, bilateral grip 5/5   Data Reviewed: Basic Metabolic Panel: Recent Labs  Lab  04/08/18 1024  NA 137  K 4.6  CL 102  CO2 24  GLUCOSE 114*  BUN 14  CREATININE 1.02  CALCIUM 9.2   Liver Function Tests: Recent Labs  Lab 04/08/18 1024  AST 28  ALT 26  ALKPHOS 50  BILITOT 0.8  PROT 7.9  ALBUMIN 4.5   No results for input(s): LIPASE, AMYLASE in the last 168 hours. No results for input(s): AMMONIA in the last 168 hours. CBC: Recent Labs  Lab 04/08/18 1024  WBC 6.4  NEUTROABS 3.7  HGB 14.7  HCT 44.5  MCV 89.5  PLT 334   Cardiac Enzymes: No results for input(s): CKTOTAL, CKMB, CKMBINDEX, TROPONINI in the last 168 hours. BNP (last 3 results) No results for input(s): BNP in the last 8760 hours.  ProBNP (last 3 results) No results for input(s): PROBNP in the last 8760 hours.  CBG: Recent Labs  Lab 04/08/18 1027  GLUCAP 102*    No results found for this or any previous visit (from the past 240 hour(s)).   Studies: Ct Angio Head W Or Wo Contrast  Result Date: 04/08/2018 CLINICAL DATA:  53 y/o M; right-sided numbness. TIA/stroke, initial exam. EXAM: CT ANGIOGRAPHY HEAD AND NECK TECHNIQUE: Multidetector CT imaging of the head and neck was performed using the standard protocol during bolus administration of intravenous contrast. Multiplanar CT image reconstructions and MIPs were obtained to evaluate the vascular anatomy. Carotid stenosis measurements (when applicable) are obtained utilizing NASCET criteria, using the distal internal carotid diameter as the denominator. CONTRAST:  4mL ISOVUE-370 IOPAMIDOL (ISOVUE-370) INJECTION 76% COMPARISON:  04/08/2018 CT head and MRI head. FINDINGS: CTA NECK FINDINGS Aortic arch: Standard branching. Imaged portion shows no evidence of  aneurysm or dissection. No significant stenosis of the major arch vessel origins. Mild aortic calcific atherosclerosis. Mixed plaque of left subclavian origin with mild less than 50% stenosis. Right carotid system: No evidence of dissection, stenosis (50% or greater) or occlusion.  Calcified plaque of the carotid bifurcation with mild less than 50% proximal ICA stenosis. Left carotid system: No evidence of dissection, stenosis (50% or greater) or occlusion. Non stenotic calcified plaque of the carotid bifurcation. Vertebral arteries: Bilateral vertebral artery origin dense calcified plaque with mild-to-moderate stenosis, accurate assessment of luminal stenosis is suboptimal due to dense calcification. Left dominant. No dissection or aneurysm. Skeleton: No acute finding. Mild discogenic degenerative changes at the C5-6 level. Other neck: Negative. Upper chest: Negative. Review of the MIP images confirms the above findings CTA HEAD FINDINGS Anterior circulation: No significant stenosis, proximal occlusion, aneurysm, or vascular malformation. Non stenotic calcific atherosclerosis of carotid siphons. Posterior circulation: No significant stenosis, proximal occlusion, aneurysm, or vascular malformation. Venous sinuses: As permitted by contrast timing, patent. Anatomic variants: Anterior communicating artery and right posterior communicating arteries. Possible diminutive left posterior communicating artery. Delayed phase: No abnormal intracranial enhancement. Review of the MIP images confirms the above findings IMPRESSION: 1. Patent carotid and vertebral arteries. No dissection, aneurysm, or hemodynamically significant stenosis utilizing NASCET criteria. 2. Patent anterior and posterior intracranial circulation. No large vessel occlusion, aneurysm, or significant stenosis. 3. Calcific atherosclerosis of aorta, carotid bifurcations, and carotid siphons. Electronically Signed   By: Kristine Garbe M.D.   On: 04/08/2018 19:13   Dg Chest 2 View  Result Date: 04/08/2018 CLINICAL DATA:  Pt c/o face numbness and right arm tingling x 1 day; pt is admitted for a TIA. Hx of HTN. Pt is a nonsmoker. EXAM: CHEST - 2 VIEW COMPARISON:  None. FINDINGS: The heart size and mediastinal contours are within  normal limits. Both lungs are clear. The visualized skeletal structures are unremarkable. IMPRESSION: No active cardiopulmonary disease. Electronically Signed   By: Nolon Nations M.D.   On: 04/08/2018 14:56   Ct Head Wo Contrast  Result Date: 04/08/2018 CLINICAL DATA:  Right sided numbness EXAM: CT HEAD WITHOUT CONTRAST TECHNIQUE: Contiguous axial images were obtained from the base of the skull through the vertex without intravenous contrast. COMPARISON:  None. FINDINGS: Brain: The brain shows a normal appearance without evidence of malformation, atrophy, old or acute small or large vessel infarction, mass lesion, hemorrhage, hydrocephalus or extra-axial collection. Vascular: No hyperdense vessel. No evidence of atherosclerotic calcification. Skull: Normal.  No traumatic finding.  No focal bone lesion. Sinuses/Orbits: Sinuses are clear. Orbits appear normal. Mastoids are clear. Other: None significant IMPRESSION: Normal head CT Electronically Signed   By: Nelson Chimes M.D.   On: 04/08/2018 11:26   Ct Angio Neck W Or Wo Contrast  Result Date: 04/08/2018 CLINICAL DATA:  53 y/o M; right-sided numbness. TIA/stroke, initial exam. EXAM: CT ANGIOGRAPHY HEAD AND NECK TECHNIQUE: Multidetector CT imaging of the head and neck was performed using the standard protocol during bolus administration of intravenous contrast. Multiplanar CT image reconstructions and MIPs were obtained to evaluate the vascular anatomy. Carotid stenosis measurements (when applicable) are obtained utilizing NASCET criteria, using the distal internal carotid diameter as the denominator. CONTRAST:  35mL ISOVUE-370 IOPAMIDOL (ISOVUE-370) INJECTION 76% COMPARISON:  04/08/2018 CT head and MRI head. FINDINGS: CTA NECK FINDINGS Aortic arch: Standard branching. Imaged portion shows no evidence of aneurysm or dissection. No significant stenosis of the major arch vessel origins. Mild aortic calcific atherosclerosis. Mixed plaque of left  subclavian origin  with mild less than 50% stenosis. Right carotid system: No evidence of dissection, stenosis (50% or greater) or occlusion. Calcified plaque of the carotid bifurcation with mild less than 50% proximal ICA stenosis. Left carotid system: No evidence of dissection, stenosis (50% or greater) or occlusion. Non stenotic calcified plaque of the carotid bifurcation. Vertebral arteries: Bilateral vertebral artery origin dense calcified plaque with mild-to-moderate stenosis, accurate assessment of luminal stenosis is suboptimal due to dense calcification. Left dominant. No dissection or aneurysm. Skeleton: No acute finding. Mild discogenic degenerative changes at the C5-6 level. Other neck: Negative. Upper chest: Negative. Review of the MIP images confirms the above findings CTA HEAD FINDINGS Anterior circulation: No significant stenosis, proximal occlusion, aneurysm, or vascular malformation. Non stenotic calcific atherosclerosis of carotid siphons. Posterior circulation: No significant stenosis, proximal occlusion, aneurysm, or vascular malformation. Venous sinuses: As permitted by contrast timing, patent. Anatomic variants: Anterior communicating artery and right posterior communicating arteries. Possible diminutive left posterior communicating artery. Delayed phase: No abnormal intracranial enhancement. Review of the MIP images confirms the above findings IMPRESSION: 1. Patent carotid and vertebral arteries. No dissection, aneurysm, or hemodynamically significant stenosis utilizing NASCET criteria. 2. Patent anterior and posterior intracranial circulation. No large vessel occlusion, aneurysm, or significant stenosis. 3. Calcific atherosclerosis of aorta, carotid bifurcations, and carotid siphons. Electronically Signed   By: Kristine Garbe M.D.   On: 04/08/2018 19:13   Mr Jeri Cos OZ Contrast  Result Date: 04/08/2018 CLINICAL DATA:  53 y/o  M; right-sided numbness. EXAM: MRI HEAD WITHOUT AND WITH CONTRAST  TECHNIQUE: Multiplanar, multiecho pulse sequences of the brain and surrounding structures were obtained without and with intravenous contrast. CONTRAST:  10 cc Gadavist COMPARISON:  04/08/2018 CT head and CTA head. FINDINGS: Brain: 12 mm left lateral precentral gyrus focus of cortical reduced diffusion compatible with acute/early subacute infarction. No hemorrhage or mass effect. Focus of infarction demonstrates faintly increased T2 FLAIR hyperintense signal abnormality. No extra-axial collection, hydrocephalus, mass effect, or herniation. Small focus of left medial orbitofrontal encephalomalacia, probably sequelae of prior traumatic brain injury given distribution. No additional structural or signal abnormality of the brain identified. After administration of intravenous contrast there is no abnormal enhancement of the brain Vascular: Normal flow voids. Skull and upper cervical spine: Normal marrow signal. Sinuses/Orbits: Negative. Other: None. IMPRESSION: 1. 12 mm acute/early subacute infarction within the left lateral precentral gyrus. No hemorrhage or mass effect. 2. Small focus of left medial orbitofrontal encephalomalacia, probably sequelae of prior traumatic brain injury. 3. Otherwise unremarkable MRI of the brain. These results will be called to the ordering clinician or representative by the Radiologist Assistant, and communication documented in the PACS or zVision Dashboard. Electronically Signed   By: Kristine Garbe M.D.   On: 04/08/2018 19:21    Scheduled Meds: . [START ON 04/10/2018] aspirin EC  81 mg Oral Daily  . clopidogrel  75 mg Oral Daily  . [START ON 04/11/2018] losartan  100 mg Oral Daily  . mometasone-formoterol  2 puff Inhalation BID  . pantoprazole  40 mg Oral Daily  . simvastatin  40 mg Oral q1800  . sodium chloride flush  3 mL Intravenous Once   Continuous Infusions: . heparin      Principal Problem:   CVA (cerebral vascular accident) ([place]) Active Problems:   LV  (left ventricular) mural thrombus without MI   Systolic dysfunction with heart failure (HCC)   TIA (transient ischemic attack)   Hyperlipidemia   Essential hypertension    Time  spent: 4 minutes    Radene Gunning NP  [facility]  If 7PM-7AM, please contact night-coverage at [email or web address], password [facility] 04/09/2018, 3:02 PM  LOS: 0 days     Patient was seen, examined,treatment plan was discussed with the Advance Practice Provider.  I have personally reviewed the clinical findings, labs, EKG, imaging studies and management of this patient in detail. I have also reviewed the orders written for this patient which were under my direction. I agree with the documentation, as recorded by the Advance Practice Provider.   Keith Black is a 53 y.o. male here with a new CVA.  Work up shows a decreased EF of 35% and a apical thrombus.  Plan is for heparin gtt and eventual change to eliquis once plan in place regarding further cardiac work up if any.  Home statin has been doubled.  Alcohol moderation encouraged.  Discussed with Dr. Stanford Breed as well as Dr. Leonie Man.  Geradine Girt, DO  How to contact the [facility] Attending or Consulting provider [place] or covering provider during after hours [place], for this patient?  1. Check the care team in [facility] and look for a) attending/consulting [facility] provider listed and b) the [facility] team listed 2. Log into [email or web address] and use [place]'s universal password to access. If you do not have the password, please contact the hospital operator. 3. Locate the [facility] provider you are looking for under [facility] and page to a number that you can be directly reached. 4. If you still have difficulty reaching the provider, please page the [facility] (Director on Call) for the Hospitalists listed on amion for assistance.

## 2018-04-09 NOTE — Progress Notes (Signed)
SLP Cancellation Note  Patient Details Name: Keith Black MRN: [id number] DOB: [birth year]   Cancelled treatment:       Reason Eval/Treat Not Completed: SLP screened, no needs identified, will sign off   Malita Ignasiak 04/09/2018, 9:44 AM

## 2018-04-09 NOTE — Progress Notes (Signed)
ANTICOAGULATION CONSULT NOTE - Initial Consult  Pharmacy Consult for heparin dosing  Indication: CVA  No Known Allergies  Patient Measurements: Height: 5\' 8"  (172.7 cm) Weight: 176 lb 5.9 oz (80 kg) IBW/kg (Calculated) : 68.4 Heparin Dosing Weight: 80kg  Vital Signs: Temp: 97.4 F (36.3 C) (02/05 1400) Temp Source: Oral (02/05 1400) BP: 138/79 (02/05 1400) Pulse Rate: 91 (02/05 1400)  Labs: Recent Labs    04/08/18 1024  HGB 14.7  HCT 44.5  PLT 334  APTT 27  LABPROT 12.8  INR 0.97  CREATININE 1.02    Estimated Creatinine Clearance: 82 mL/min (by C-G formula based on SCr of 1.02 mg/dL).   Medical History: Past Medical History:  Diagnosis Date  . High cholesterol   . Hypertension   . Ocular migraine     Assessment: 53 yo male admitted with complaints of right sided facial numbness and arm numbness. PMH includes hypertension and hyperlipidemia. ECHO completed on 2/5 with findings of small thrombus on the apical wall of the left ventricle. Pharmacy consulted to start heparin for heart clot/CVA.   No s/sx of bleeding reported. CBCs stable, Plts 334.   Goal of Therapy:  Heparin level goal 0.3-0.5 Monitor platelets by anticoagulation protocol: Yes   Plan:  No heparin bolus  Start heparin infusion at 650 units/hr Continue to monitor H&H and platelets   Gwenlyn Found, Sherian Rein D PGY1 Pharmacy Resident  Phone [phone] 04/09/2018   2:52 PM

## 2018-04-09 NOTE — Progress Notes (Addendum)
STROKE TEAM PROGRESS NOTE   INTERVAL HISTORY His wife Cristine is at the bedside.  Symptoms started yesterday when he was sitting at his desk. R arm became suddenly weak and numb for a few minutes with the tingling lasting up to 1 hour and weakness resolving. Hx ocular migraine a few months R eye. No HA. Wife at bedside. They were hoping for d/c today. They thought TEE would happen today. Will keep NPO after MN and do test tomorrow. Recently moved here from [place] and have not gotten integrated into the medical system here yet. Works as a Facilities manager.  They were scheduled to attend a baptism in [place] this weekend, travel not advised. Ok to travel next week for work if feels able.  Vitals:   04/09/18 0000 04/09/18 0400 04/09/18 0613 04/09/18 0739  BP: 111/64 121/73  (!) 141/80  Pulse: 85 70  90  Resp: (!) 23 12  20   Temp: 98 F (36.7 C) 98.3 F (36.8 C)  (!) 97.4 F (36.3 C)  TempSrc: Oral Oral  Oral  SpO2: 100% 97%  100%  Weight:   80 kg   Height:   5\' 8"  (1.727 m)     CBC:  Recent Labs  Lab 04/08/18 1024  WBC 6.4  NEUTROABS 3.7  HGB 14.7  HCT 44.5  MCV 89.5  PLT 921    Basic Metabolic Panel:  Recent Labs  Lab 04/08/18 1024  NA 137  K 4.6  CL 102  CO2 24  GLUCOSE 114*  BUN 14  CREATININE 1.02  CALCIUM 9.2   Lipid Panel:     Component Value Date/Time   CHOL 215 (H) 04/09/2018 0523   TRIG 201 (H) 04/09/2018 0523   HDL 59 04/09/2018 0523   CHOLHDL 3.6 04/09/2018 0523   VLDL 40 04/09/2018 0523   LDLCALC 116 (H) 04/09/2018 0523   HgbA1c:  Lab Results  Component Value Date   HGBA1C 5.7 (H) 04/09/2018   Urine Drug Screen: No results found for: LABOPIA, COCAINSCRNUR, LABBENZ, AMPHETMU, THCU, LABBARB  Alcohol Level No results found for: ETH  IMAGING Ct Angio Head W Or Wo Contrast  Result Date: 04/08/2018 CLINICAL DATA:  53 y/o M; right-sided numbness. TIA/stroke, initial exam. EXAM: CT ANGIOGRAPHY HEAD AND NECK TECHNIQUE: Multidetector CT imaging of the  head and neck was performed using the standard protocol during bolus administration of intravenous contrast. Multiplanar CT image reconstructions and MIPs were obtained to evaluate the vascular anatomy. Carotid stenosis measurements (when applicable) are obtained utilizing NASCET criteria, using the distal internal carotid diameter as the denominator. CONTRAST:  80mL ISOVUE-370 IOPAMIDOL (ISOVUE-370) INJECTION 76% COMPARISON:  04/08/2018 CT head and MRI head. FINDINGS: CTA NECK FINDINGS Aortic arch: Standard branching. Imaged portion shows no evidence of aneurysm or dissection. No significant stenosis of the major arch vessel origins. Mild aortic calcific atherosclerosis. Mixed plaque of left subclavian origin with mild less than 50% stenosis. Right carotid system: No evidence of dissection, stenosis (50% or greater) or occlusion. Calcified plaque of the carotid bifurcation with mild less than 50% proximal ICA stenosis. Left carotid system: No evidence of dissection, stenosis (50% or greater) or occlusion. Non stenotic calcified plaque of the carotid bifurcation. Vertebral arteries: Bilateral vertebral artery origin dense calcified plaque with mild-to-moderate stenosis, accurate assessment of luminal stenosis is suboptimal due to dense calcification. Left dominant. No dissection or aneurysm. Skeleton: No acute finding. Mild discogenic degenerative changes at the C5-6 level. Other neck: Negative. Upper chest: Negative. Review of the MIP  images confirms the above findings CTA HEAD FINDINGS Anterior circulation: No significant stenosis, proximal occlusion, aneurysm, or vascular malformation. Non stenotic calcific atherosclerosis of carotid siphons. Posterior circulation: No significant stenosis, proximal occlusion, aneurysm, or vascular malformation. Venous sinuses: As permitted by contrast timing, patent. Anatomic variants: Anterior communicating artery and right posterior communicating arteries. Possible diminutive  left posterior communicating artery. Delayed phase: No abnormal intracranial enhancement. Review of the MIP images confirms the above findings IMPRESSION: 1. Patent carotid and vertebral arteries. No dissection, aneurysm, or hemodynamically significant stenosis utilizing NASCET criteria. 2. Patent anterior and posterior intracranial circulation. No large vessel occlusion, aneurysm, or significant stenosis. 3. Calcific atherosclerosis of aorta, carotid bifurcations, and carotid siphons. Electronically Signed   By: Kristine Garbe M.D.   On: 04/08/2018 19:13   Dg Chest 2 View  Result Date: 04/08/2018 CLINICAL DATA:  Pt c/o face numbness and right arm tingling x 1 day; pt is admitted for a TIA. Hx of HTN. Pt is a nonsmoker. EXAM: CHEST - 2 VIEW COMPARISON:  None. FINDINGS: The heart size and mediastinal contours are within normal limits. Both lungs are clear. The visualized skeletal structures are unremarkable. IMPRESSION: No active cardiopulmonary disease. Electronically Signed   By: Nolon Nations M.D.   On: 04/08/2018 14:56   Ct Head Wo Contrast  Result Date: 04/08/2018 CLINICAL DATA:  Right sided numbness EXAM: CT HEAD WITHOUT CONTRAST TECHNIQUE: Contiguous axial images were obtained from the base of the skull through the vertex without intravenous contrast. COMPARISON:  None. FINDINGS: Brain: The brain shows a normal appearance without evidence of malformation, atrophy, old or acute small or large vessel infarction, mass lesion, hemorrhage, hydrocephalus or extra-axial collection. Vascular: No hyperdense vessel. No evidence of atherosclerotic calcification. Skull: Normal.  No traumatic finding.  No focal bone lesion. Sinuses/Orbits: Sinuses are clear. Orbits appear normal. Mastoids are clear. Other: None significant IMPRESSION: Normal head CT Electronically Signed   By: Nelson Chimes M.D.   On: 04/08/2018 11:26   Ct Angio Neck W Or Wo Contrast  Result Date: 04/08/2018 CLINICAL DATA:  53 y/o M;  right-sided numbness. TIA/stroke, initial exam. EXAM: CT ANGIOGRAPHY HEAD AND NECK TECHNIQUE: Multidetector CT imaging of the head and neck was performed using the standard protocol during bolus administration of intravenous contrast. Multiplanar CT image reconstructions and MIPs were obtained to evaluate the vascular anatomy. Carotid stenosis measurements (when applicable) are obtained utilizing NASCET criteria, using the distal internal carotid diameter as the denominator. CONTRAST:  17mL ISOVUE-370 IOPAMIDOL (ISOVUE-370) INJECTION 76% COMPARISON:  04/08/2018 CT head and MRI head. FINDINGS: CTA NECK FINDINGS Aortic arch: Standard branching. Imaged portion shows no evidence of aneurysm or dissection. No significant stenosis of the major arch vessel origins. Mild aortic calcific atherosclerosis. Mixed plaque of left subclavian origin with mild less than 50% stenosis. Right carotid system: No evidence of dissection, stenosis (50% or greater) or occlusion. Calcified plaque of the carotid bifurcation with mild less than 50% proximal ICA stenosis. Left carotid system: No evidence of dissection, stenosis (50% or greater) or occlusion. Non stenotic calcified plaque of the carotid bifurcation. Vertebral arteries: Bilateral vertebral artery origin dense calcified plaque with mild-to-moderate stenosis, accurate assessment of luminal stenosis is suboptimal due to dense calcification. Left dominant. No dissection or aneurysm. Skeleton: No acute finding. Mild discogenic degenerative changes at the C5-6 level. Other neck: Negative. Upper chest: Negative. Review of the MIP images confirms the above findings CTA HEAD FINDINGS Anterior circulation: No significant stenosis, proximal occlusion, aneurysm, or vascular malformation. Non  stenotic calcific atherosclerosis of carotid siphons. Posterior circulation: No significant stenosis, proximal occlusion, aneurysm, or vascular malformation. Venous sinuses: As permitted by contrast  timing, patent. Anatomic variants: Anterior communicating artery and right posterior communicating arteries. Possible diminutive left posterior communicating artery. Delayed phase: No abnormal intracranial enhancement. Review of the MIP images confirms the above findings IMPRESSION: 1. Patent carotid and vertebral arteries. No dissection, aneurysm, or hemodynamically significant stenosis utilizing NASCET criteria. 2. Patent anterior and posterior intracranial circulation. No large vessel occlusion, aneurysm, or significant stenosis. 3. Calcific atherosclerosis of aorta, carotid bifurcations, and carotid siphons. Electronically Signed   By: Kristine Garbe M.D.   On: 04/08/2018 19:13   Mr Jeri Cos MV Contrast  Result Date: 04/08/2018 CLINICAL DATA:  53 y/o  M; right-sided numbness. EXAM: MRI HEAD WITHOUT AND WITH CONTRAST TECHNIQUE: Multiplanar, multiecho pulse sequences of the brain and surrounding structures were obtained without and with intravenous contrast. CONTRAST:  10 cc Gadavist COMPARISON:  04/08/2018 CT head and CTA head. FINDINGS: Brain: 12 mm left lateral precentral gyrus focus of cortical reduced diffusion compatible with acute/early subacute infarction. No hemorrhage or mass effect. Focus of infarction demonstrates faintly increased T2 FLAIR hyperintense signal abnormality. No extra-axial collection, hydrocephalus, mass effect, or herniation. Small focus of left medial orbitofrontal encephalomalacia, probably sequelae of prior traumatic brain injury given distribution. No additional structural or signal abnormality of the brain identified. After administration of intravenous contrast there is no abnormal enhancement of the brain Vascular: Normal flow voids. Skull and upper cervical spine: Normal marrow signal. Sinuses/Orbits: Negative. Other: None. IMPRESSION: 1. 12 mm acute/early subacute infarction within the left lateral precentral gyrus. No hemorrhage or mass effect. 2. Small focus of  left medial orbitofrontal encephalomalacia, probably sequelae of prior traumatic brain injury. 3. Otherwise unremarkable MRI of the brain. These results will be called to the ordering clinician or representative by the Radiologist Assistant, and communication documented in the PACS or zVision Dashboard. Electronically Signed   By: Kristine Garbe M.D.   On: 04/08/2018 19:21    PHYSICAL EXAM Pleasant middle-age Caucasian male currently not in distress. . Afebrile. Head is nontraumatic. Neck is supple without bruit.    Cardiac exam no murmur or gallop. Lungs are clear to auscultation. Distal pulses are well felt. Neurological Exam ;  Awake  Alert oriented x 3. Normal speech and language.eye movements full without nystagmus.fundi were not visualized. Vision acuity and fields appear normal. Hearing is normal. Palatal movements are normal. Face symmetric. Tongue midline. Normal strength, tone, reflexes and coordination. Normal sensation. Gait deferred.   ASSESSMENT/PLAN Keith Black is a 53 y.o. male with history of HTN and ocular migraine 2 mos ago x 1 presenting with R arm weakness, numbness and tingling.   Stroke:   Acute L cortical lateral precentral gyrus infarct in setting of TIA even 2 months ago and palpitations, concerned for embolic source  CT head normal  CTA head & neck atherosclerosis o/w normal  MRI  L lateral precentral gyrus infarct. Small focus old L medical orbitofrontal encephalomalacia   2D Echo  Small thrombus on the PICA wall of the left ventricle. Diminished ejection fraction 35-40%.  LDL 116  HgbA1c 5.7  Lovenox 40 mg sq daily for VTE prophylaxis  No antithrombotic prior to admission, now on aspirin 325 mg daily  Likely need anticoagulation for 6 months given left ventricular thrombus- warfarin versus eliquis to be decided by cardiology  Therapy recommendations:  no therapy needs  Disposition:  pending   30  mins continuous activity every day  recommended for secondary stroke prevention  Hx palpitations  Medical follow up - not AF  Hypertension  Elevated but Stable . Permissive hypertension (OK if < 220/120) but gradually normalize in 5-7 days . Ok to resume home BP meds . Long-term BP goal normotensive  Hyperlipidemia  Home meds:  zocor 20, resumed in hospital  LDL 116, goal < 70  Increase zocor to 40 mg daily  Continue statin at discharge  Other Stroke Risk Factors  Family hx stroke (father deceased from stroke)  ETOH - 2-3 drinks daily after work  Hx HA - no dx Migraines prior to event below  Hx TIA  Hx HA - blurred vision R eye. L eye ok. R eye everything blurred. Saw his eye doctor who dx ocular migraine. This all happened 2 months ago  Mild Obstructive sleep apnea, prescribed CPAP but not using. OP sleep eval/advice recommended  Hospital day # 0  Burnetta Sabin, MSN, APRN, ANVP-BC, AGPCNP-BC Advanced Practice Stroke Nurse [place] for Schedule & Pager information 04/09/2018 10:55 AM  I have personally obtained history,examined this patient, reviewed notes, independently viewed imaging studies, participated in medical decision making and plan of care.ROS completed by me personally and pertinent positives fully documented  I have made any additions or clarifications directly to the above note. Agree with note above.he presented with transient right face and arm paresthesias and weakness due to small embolic left MCA branch infarct of cardioembolic etiology from left ventricular apical wall thrombus. Recommend short-term anticoagulation with IV heparin and switched to oral warfarin on eliquis after recommendations from cardiology. Cardiology consult for further cardiac workup. Long discussion with the patient and wife and Dr. Eliseo Squires and answered questions. Greater than 50% time during this 35 minute visit was spent on counseling and coordination of care but is embolic stroke and discussion  about further workup and treatment  Antony Contras, MD Medical Director [place] Pager: [phone] 04/09/2018 3:33 PM  To contact Stroke Continuity provider, please refer to [email or web address] After hours, contact General Neurology

## 2018-04-09 NOTE — Consult Note (Addendum)
Cardiology Consultation:   Patient ID: Keith Black; [id number]; 1965/08/10   Admit date: 04/08/2018 Date of Consult: 04/09/2018  Primary Care Provider: Patient, [name] Primary Cardiologist: Jenkins Rouge, MD New Primary Electrophysiologist:  None   Patient Profile:   Keith Black is a 53 y.o. male with a hx of HTN, HLD, who is being seen today for the evaluation of LV thrombus at the request of Dr Eliseo Squires.  History of Present Illness:   Keith Black was admitted 02/04 with acute L cortical lateral precentral gyrus infarct and hx TIA 2 mos ago. Routine echo w/ LV thrombus and cards asked to see.   A few months ago, sometime in 11/2017, he had an episode of blurred vision, R eye only, it resolved w/out intervention. Saw ophthalmologist, was told ocular migraine and no further workup needed.  At that time, he had been on an extended trip overseas, 3 countries, prolonged travel in a 2 week period of time.   Admitted 02/04 with R face and R arm paresthesias. BP elevated in the ER. A little dizzy w/ high BP. CVA by MRI.   He is compliant w/ meds, has been checked for one reason or another recently and SBP has been 120s. A few years ago, SBP was 130s-140s.   He does not exercise. He eats fairly well at home, worse when traveling.   No orthopnea or PND. No sig change in exercise tolerance, maybe a little less. No LE edema.   No chest pain w/ exertion. He gets some CP at times, not w/ exertion, gets w/ stress, may be gas.  He has had some rapid heart rates. One day, he felt his heat beating very irregularly. It was also fast. This happened less than a month ago. He gets regular palpitations pretty often, sounds like skipped beats.   Wore a monitor several years ago for palps, similar to the recent episode. Just for a few days several years ago, it was ok.    Past Medical History:  Diagnosis Date  . CVA (cerebrovascular accident) ([place]) 04/08/2018  . GERD (gastroesophageal reflux disease)   .  High cholesterol   . Hypertension   . LV (left ventricular) mural thrombus without MI   . Ocular migraine     Past Surgical History:  Procedure Laterality Date  . HERNIA REPAIR Bilateral      Prior to Admission medications   Medication Sig Start Date End Date Taking? Authorizing Provider  albuterol (PROAIR HFA) 108 (90 Base) MCG/ACT inhaler Inhale 2 puffs into the lungs every 6 (six) hours as needed for wheezing or shortness of breath.   Yes [provider]  azelastine (ASTELIN) 0.1 % nasal spray Place 2 sprays into both nostrils 2 (two) times daily.    Yes [provider]  cetirizine (ZYRTEC) 10 MG tablet Take 10 mg by mouth daily.   Yes [provider]  Cholecalciferol ([employer] VIT D 5000 HIGH-POTENCY PO) Take 1 capsule by mouth daily.   Yes [provider]  fluticasone (FLONASE) 50 MCG/ACT nasal spray Place 1 spray into both nostrils daily.   Yes [provider]  losartan (COZAAR) 100 MG tablet Take 100 mg by mouth daily.   Yes [provider]  pantoprazole (PROTONIX) 40 MG tablet Take 1 tablet (40 mg total) by mouth daily. 12/16/17  Yes Tanda Rockers, MD  Probiotic Product (ALIGN PO) Take 1 capsule by mouth daily.   Yes [provider]  simvastatin (ZOCOR) 20 MG tablet  Take 20 mg by mouth daily.   Yes [provider]  budesonide-formoterol (SYMBICORT) 80-4.5 MCG/ACT inhaler Inhale 2 puffs into the lungs 2 (two) times daily. Patient not taking: Reported on 04/08/2018 12/16/17   Tanda Rockers, MD  UNABLE TO FIND Med Name: [facility]    [provider]    Inpatient Medications: Scheduled Meds: . [START ON 04/10/2018] aspirin EC  81 mg Oral Daily  . clopidogrel  75 mg Oral Daily  . [START ON 04/11/2018] losartan  100 mg Oral Daily  . mometasone-formoterol  2 puff Inhalation BID  . pantoprazole  40 mg Oral Daily  . simvastatin  40 mg Oral q1800  . sodium chloride flush  3 mL Intravenous Once   Continuous  Infusions: . heparin     PRN Meds: acetaminophen **OR** acetaminophen (TYLENOL) oral liquid 160 mg/5 mL **OR** acetaminophen, LORazepam, senna-docusate  Allergies:   No Known Allergies  Social History:   Social History   Socioeconomic History  . Marital status: Married    Spouse name: Not on file  . Number of children: Not on file  . Years of education: Not on file  . Highest education level: Not on file  Occupational History  . 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[employer]  . Financial resource strain: Not on file  . Food insecurity:    Worry: Not on file    Inability: Not on file  . Transportation needs:    Medical: Not on file    Non-medical: Not on file  Tobacco Use  . Smoking status: Never Smoker  . Smokeless tobacco: Current User    Types: Snuff  Substance and Sexual Activity  . Alcohol use: Yes    Alcohol/week: 25.0 standard drinks    Types: 25 Glasses of wine per week  . Drug use: Not on file  . Sexual activity: Not on file  Lifestyle  . Physical activity:    Days per week: Not on file    Minutes per session: Not on file  . Stress: Not on file  Relationships  . Social connections:    Talks on phone: Not on file    Gets together: Not on file    Attends religious service: Not on file    Active member of club or organization: Not on file    Attends meetings of clubs or organizations: Not on file    Relationship status: Not on file  . Intimate partner violence:    Fear of current or ex partner: Not on file    Emotionally abused: Not on file    Physically abused: Not on file    Forced sexual activity: Not on file  Other Topics Concern  . Not on file  Social History Narrative   Lives w/ wife in [place], a [place] neighborhood.    Family History:   Family History  Problem Relation Age of [place]  . Allergies Mother   . Asthma Mother   . Heart disease Mother 66       had stents and then CABG  . Stroke Father 34    Family Status:  Family Status  Relation Name Status  . Mother  Alive  . Father  Deceased  . Sister  Alive  . Brother  Alive    ROS:  Please see the history of present illness.  All other ROS reviewed and negative.     Physical Exam/Data:   Vitals:   04/09/18 0400 04/09/18  4034 04/09/18 0739 04/09/18 1400  BP: 121/73  (!) 141/80 138/79  Pulse: 70  90 91  Resp: 12  20 20   Temp: 98.3 F (36.8 C)  (!) 97.4 F (36.3 C) (!) 97.4 F (36.3 C)  TempSrc: Oral  Oral Oral  SpO2: 97%  100% 100%  Weight:  80 kg    Height:  5\' 8"  (1.727 m)     No intake or output data in the 24 hours ending 04/09/18 1538 Filed Weights   04/09/18 7425  Weight: 80 kg   Body mass index is 26.82 kg/m.  General:  Well nourished, well developed, in no acute distress HEENT: normal Lymph: no adenopathy Neck: no JVD Endocrine:  No thryomegaly Vascular: No carotid bruits; 4/4 extremity pulses 2+, without bruits  Cardiac:  normal S1, S2; RRR; no murmur  Lungs:  clear to auscultation bilaterally, no wheezing, rhonchi or rales  Abd: soft, nontender, no hepatomegaly  Ext: no edema Musculoskeletal:  No deformities, BUE and BLE strength normal and equal Skin: warm and dry  Neuro:  CNs 2-12 intact, no focal abnormalities noted Psych:  Normal affect   EKG:  The EKG was personally reviewed and demonstrates:  02/04, ST, HR 119, poor R wave transmission Telemetry:  Telemetry was personally reviewed and demonstrates:  SR, ST  Relevant CV Studies:  ECHO: 04/09/2018  1. Small, thrombus on the apical wall of the left ventricle.  2. The left ventricle has moderately reduced systolic function of 95-63%. The cavity size is normal. There is mildly increased left ventricular wall thickness. Echo evidence of impaired diastolic relaxation.  3. The right ventricle has normal systolic function. The cavity in normal in size. There is no increase in right ventricular wall thickness.  4. The mitral valve is normal in  structure.  5. The aortic valve is tricuspid. There is mild thickening of the aortic valve.  6. Definity used; apical akinesis with overall moderate LV dysfunction; mild LVH; mild diastolic dysfunction; apical thrombus noted; discussed with Dr Eliseo Squires.  FINDINGS  Left Ventricle: The left ventricle has moderately reduced systolic function of 87-56%. The cavity size is normal. There is mildly increased left ventricular wall thickness. Echo evidence of impaired diastolic relaxation There is akinesis of the entire  apical left ventricular segment. Definity contrast agent was given IV to delineate the left ventricular endocardial borders. There is a small, apical left ventricular thrombus. Right Ventricle: The right ventricle has normal systolic function. The cavity in normal in size. There is no increase in right ventricular wall thickness. Left Atrium: is normal in size Right Atrium: is normal in size Interatrial Septum: No atrial level shunt detected by color flow Doppler.  Pericardium: There is no evidence of pericardial effusion. Mitral Valve: The mitral valve is normal in structure Mitral valve regurgitation is trivial by color flow Doppler.Diastolic mitral regurgitation is absent. Tricuspid Valve: The tricuspid valve is normal in structure. Tricuspid valve regurgitation is trivial by color flow Doppler. Aortic Valve: The aortic valve is tricuspid. There is mild thickening of the aortic valve. Pulmonic Valve: The pulmonic valve is normal in structure. Pulmonic valve regurgitation is not visualized by color flow Doppler. Venous: The inferior vena cava was normal in size with greater than 50% respiratory variability.  Laboratory Data:  Chemistry Recent Labs  Lab 04/08/18 1024  NA 137  K 4.6  CL 102  CO2 24  GLUCOSE 114*  BUN 14  CREATININE 1.02  CALCIUM 9.2  GFRNONAA >60  GFRAA >60  ANIONGAP 11    Lab Results  Component Value Date   ALT 26 04/08/2018   AST 28 04/08/2018    ALKPHOS 50 04/08/2018   BILITOT 0.8 04/08/2018   Hematology Recent Labs  Lab 04/08/18 1024  WBC 6.4  RBC 4.97  HGB 14.7  HCT 44.5  MCV 89.5  MCH 29.6  MCHC 33.0  RDW 11.9  PLT 334   Cardiac EnzymesNo results for input(s): TROPONINI in the last 168 hours.  Recent Labs  Lab 04/08/18 1020  TROPIPOC 0.02    BNPNo results for input(s): BNP, PROBNP in the last 168 hours.  DDimer No results for input(s): DDIMER in the last 168 hours. TSH: No results found for: TSH Lipids: Lab Results  Component Value Date   CHOL 215 (H) 04/09/2018   HDL 59 04/09/2018   LDLCALC 116 (H) 04/09/2018   TRIG 201 (H) 04/09/2018   CHOLHDL 3.6 04/09/2018   HgbA1c: Lab Results  Component Value Date   HGBA1C 5.7 (H) 04/09/2018   Magnesium: No results found for: MG   Radiology/Studies:  Ct Angio Head W Or Wo Contrast  Result Date: 04/08/2018 CLINICAL DATA:  53 y/o M; right-sided numbness. TIA/stroke, initial exam. EXAM: CT ANGIOGRAPHY HEAD AND NECK TECHNIQUE: Multidetector CT imaging of the head and neck was performed using the standard protocol during bolus administration of intravenous contrast. Multiplanar CT image reconstructions and MIPs were obtained to evaluate the vascular anatomy. Carotid stenosis measurements (when applicable) are obtained utilizing NASCET criteria, using the distal internal carotid diameter as the denominator. CONTRAST:  73mL ISOVUE-370 IOPAMIDOL (ISOVUE-370) INJECTION 76% COMPARISON:  04/08/2018 CT head and MRI head. FINDINGS: CTA NECK FINDINGS Aortic arch: Standard branching. Imaged portion shows no evidence of aneurysm or dissection. No significant stenosis of the major arch vessel origins. Mild aortic calcific atherosclerosis. Mixed plaque of left subclavian origin with mild less than 50% stenosis. Right carotid system: No evidence of dissection, stenosis (50% or greater) or occlusion. Calcified plaque of the carotid bifurcation with mild less than 50% proximal ICA stenosis.  Left carotid system: No evidence of dissection, stenosis (50% or greater) or occlusion. Non stenotic calcified plaque of the carotid bifurcation. Vertebral arteries: Bilateral vertebral artery origin dense calcified plaque with mild-to-moderate stenosis, accurate assessment of luminal stenosis is suboptimal due to dense calcification. Left dominant. No dissection or aneurysm. Skeleton: No acute finding. Mild discogenic degenerative changes at the C5-6 level. Other neck: Negative. Upper chest: Negative. Review of the MIP images confirms the above findings CTA HEAD FINDINGS Anterior circulation: No significant stenosis, proximal occlusion, aneurysm, or vascular malformation. Non stenotic calcific atherosclerosis of carotid siphons. Posterior circulation: No significant stenosis, proximal occlusion, aneurysm, or vascular malformation. Venous sinuses: As permitted by contrast timing, patent. Anatomic variants: Anterior communicating artery and right posterior communicating arteries. Possible diminutive left posterior communicating artery. Delayed phase: No abnormal intracranial enhancement. Review of the MIP images confirms the above findings IMPRESSION: 1. Patent carotid and vertebral arteries. No dissection, aneurysm, or hemodynamically significant stenosis utilizing NASCET criteria. 2. Patent anterior and posterior intracranial circulation. No large vessel occlusion, aneurysm, or significant stenosis. 3. Calcific atherosclerosis of aorta, carotid bifurcations, and carotid siphons. Electronically Signed   By: Kristine Garbe M.D.   On: 04/08/2018 19:13   Dg Chest 2 View  Result Date: 04/08/2018 CLINICAL DATA:  Pt c/o face numbness and right arm tingling x 1 day; pt is admitted for a TIA. Hx of HTN. Pt is a nonsmoker. EXAM: CHEST - 2 VIEW COMPARISON:  None. FINDINGS: The heart size and mediastinal contours are within normal limits. Both lungs are clear. The visualized skeletal structures are unremarkable.  IMPRESSION: No active cardiopulmonary disease. Electronically Signed   By: Nolon Nations M.D.   On: 04/08/2018 14:56   Ct Head Wo Contrast  Result Date: 04/08/2018 CLINICAL DATA:  Right sided numbness EXAM: CT HEAD WITHOUT CONTRAST TECHNIQUE: Contiguous axial images were obtained from the base of the skull through the vertex without intravenous contrast. COMPARISON:  None. FINDINGS: Brain: The brain shows a normal appearance without evidence of malformation, atrophy, old or acute small or large vessel infarction, mass lesion, hemorrhage, hydrocephalus or extra-axial collection. Vascular: No hyperdense vessel. No evidence of atherosclerotic calcification. Skull: Normal.  No traumatic finding.  No focal bone lesion. Sinuses/Orbits: Sinuses are clear. Orbits appear normal. Mastoids are clear. Other: None significant IMPRESSION: Normal head CT Electronically Signed   By: Nelson Chimes M.D.   On: 04/08/2018 11:26   Ct Angio Neck W Or Wo Contrast  Result Date: 04/08/2018 CLINICAL DATA:  53 y/o M; right-sided numbness. TIA/stroke, initial exam. EXAM: CT ANGIOGRAPHY HEAD AND NECK TECHNIQUE: Multidetector CT imaging of the head and neck was performed using the standard protocol during bolus administration of intravenous contrast. Multiplanar CT image reconstructions and MIPs were obtained to evaluate the vascular anatomy. Carotid stenosis measurements (when applicable) are obtained utilizing NASCET criteria, using the distal internal carotid diameter as the denominator. CONTRAST:  58mL ISOVUE-370 IOPAMIDOL (ISOVUE-370) INJECTION 76% COMPARISON:  04/08/2018 CT head and MRI head. FINDINGS: CTA NECK FINDINGS Aortic arch: Standard branching. Imaged portion shows no evidence of aneurysm or dissection. No significant stenosis of the major arch vessel origins. Mild aortic calcific atherosclerosis. Mixed plaque of left subclavian origin with mild less than 50% stenosis. Right carotid system: No evidence of dissection,  stenosis (50% or greater) or occlusion. Calcified plaque of the carotid bifurcation with mild less than 50% proximal ICA stenosis. Left carotid system: No evidence of dissection, stenosis (50% or greater) or occlusion. Non stenotic calcified plaque of the carotid bifurcation. Vertebral arteries: Bilateral vertebral artery origin dense calcified plaque with mild-to-moderate stenosis, accurate assessment of luminal stenosis is suboptimal due to dense calcification. Left dominant. No dissection or aneurysm. Skeleton: No acute finding. Mild discogenic degenerative changes at the C5-6 level. Other neck: Negative. Upper chest: Negative. Review of the MIP images confirms the above findings CTA HEAD FINDINGS Anterior circulation: No significant stenosis, proximal occlusion, aneurysm, or vascular malformation. Non stenotic calcific atherosclerosis of carotid siphons. Posterior circulation: No significant stenosis, proximal occlusion, aneurysm, or vascular malformation. Venous sinuses: As permitted by contrast timing, patent. Anatomic variants: Anterior communicating artery and right posterior communicating arteries. Possible diminutive left posterior communicating artery. Delayed phase: No abnormal intracranial enhancement. Review of the MIP images confirms the above findings IMPRESSION: 1. Patent carotid and vertebral arteries. No dissection, aneurysm, or hemodynamically significant stenosis utilizing NASCET criteria. 2. Patent anterior and posterior intracranial circulation. No large vessel occlusion, aneurysm, or significant stenosis. 3. Calcific atherosclerosis of aorta, carotid bifurcations, and carotid siphons. Electronically Signed   By: Kristine Garbe M.D.   On: 04/08/2018 19:13   Mr Jeri Cos [place] Contrast  Result Date: 04/08/2018 CLINICAL DATA:  53 y/o  M; right-sided numbness. EXAM: MRI HEAD WITHOUT AND WITH CONTRAST TECHNIQUE: Multiplanar, multiecho pulse sequences of the brain and surrounding structures  were obtained without and with intravenous contrast. CONTRAST:  10 cc Gadavist COMPARISON:  04/08/2018 CT head and CTA head. FINDINGS: Brain: 12 mm left  lateral precentral gyrus focus of cortical reduced diffusion compatible with acute/early subacute infarction. No hemorrhage or mass effect. Focus of infarction demonstrates faintly increased T2 FLAIR hyperintense signal abnormality. No extra-axial collection, hydrocephalus, mass effect, or herniation. Small focus of left medial orbitofrontal encephalomalacia, probably sequelae of prior traumatic brain injury given distribution. No additional structural or signal abnormality of the brain identified. After administration of intravenous contrast there is no abnormal enhancement of the brain Vascular: Normal flow voids. Skull and upper cervical spine: Normal marrow signal. Sinuses/Orbits: Negative. Other: None. IMPRESSION: 1. 12 mm acute/early subacute infarction within the left lateral precentral gyrus. No hemorrhage or mass effect. 2. Small focus of left medial orbitofrontal encephalomalacia, probably sequelae of prior traumatic brain injury. 3. Otherwise unremarkable MRI of the brain. These results will be called to the ordering clinician or representative by the Radiologist Assistant, and communication documented in the PACS or zVision Dashboard. Electronically Signed   By: Kristine Garbe M.D.   On: 04/08/2018 19:21    Assessment and Plan:   1.  LV thrombus without MI - anticoagulate w/ heparin until workup completed, then will need DOAC - confirm thrombus w/ MRI, it is not well seen on echo - Because of his travel schedule, will need to be very clear about when he can fly/travel again.  2.  CVA - He had similar symptoms in September of this past year, but they resolved and the ophthalmologist did not feel strongly any further work-up was indicated. - Symptoms this time were more severe and widespread. Resolved prior to admission. - Per Neuro  and IM  3.  Left ventricular dysfunction/new cardiomyopathy - He has no signs or symptoms of volume overload by exam or by history. - He had not exercise in 8 months due to multiple issues, but he and his wife went for a walk yesterday and he did well with this. No hx ischemic sx. - Prior to admission, he was on losartan 100 mg daily. - Would decrease the losartan down to 50 mg daily and start carvedilol 6.25 mg twice daily -At this time, does not need diuretic therapy - needs ischemic eval, per Dr Johnsie Cancel, go ahead and get this prior to d/c. - The risks and benefits of a cardiac catheterization including, but not limited to, death, stroke, MI, kidney damage and bleeding were discussed with the patient who indicates understanding and agrees to proceed.  - scheduled for 3 pm 02/06, but may be able to move that up if MRI done early.   Otherwise, per IM Principal Problem:   CVA (cerebral vascular accident) ([place]) Active Problems:   TIA (transient ischemic attack)   LV (left ventricular) mural thrombus without MI   Hyperlipidemia   Essential hypertension   Systolic dysfunction with heart failure ([place])   For questions or updates, please contact [facility] Please consult [email or web address] for contact info under Cardiology/STEMI.   Signed, Rosaria Ferries, PA-C  04/09/2018 3:38 PM  Patient examined chart reviewed Discussed care with nurse, patient, wife and PA. Reviewed echo and does not appear to be Takatsubo. Has discreet LAD RWMA with thinned and akinetic distal septum and apex. Also appears to Be thrombus in apex which would be source of patients stroke. He has no history to suggest MI. ECG shows poor R wave progression and no acute changes. Discussed options with patient. Favor MRI tonight to definitively show thrombus and assess EF and infarct. Then proceed with diagnostic cath in am from radial approach. No LV gram  Risks including contrast allergy , MI, stroke , bleeding and need for  emergency surgery discussed willing to proceed. Spoke with MRI tech Char who is kind enough to try and do MRI scan tonight. He is not claustrophobic. HR is high needs to be on beta blocker will give 50 mg PO lopressor before MRI then dose bid. Will need to start entresto at some point likely post cath. Exam with ? Still mild speech difficulty no weakness in extremities no murmur clear lungs and good right radial pulse  Jenkins Rouge

## 2018-04-10 ENCOUNTER — Telehealth: Payer: Self-pay | Admitting: Cardiovascular Disease

## 2018-04-10 ENCOUNTER — Inpatient Hospital Stay (HOSPITAL_COMMUNITY)
Admission: EM | Disposition: A | Discharge status: Home / Self Care | Payer: Self-pay | Source: Home / Self Care | Attending: Internal Medicine

## 2018-04-10 ENCOUNTER — Other Ambulatory Visit: Payer: Self-pay | Admitting: *Deleted

## 2018-04-10 ENCOUNTER — Encounter (HOSPITAL_COMMUNITY): Payer: Self-pay | Admitting: Cardiovascular Disease

## 2018-04-10 ENCOUNTER — Encounter (HOSPITAL_COMMUNITY)
Admission: EM | Disposition: A | Discharge status: Home / Self Care | Payer: Self-pay | Source: Home / Self Care | Attending: Internal Medicine

## 2018-04-10 DIAGNOSIS — I5020 Unspecified systolic (congestive) heart failure: Secondary | ICD-10-CM

## 2018-04-10 DIAGNOSIS — I2510 Atherosclerotic heart disease of native coronary artery without angina pectoris: Secondary | ICD-10-CM

## 2018-04-10 DIAGNOSIS — I25110 Atherosclerotic heart disease of native coronary artery with unstable angina pectoris: Secondary | ICD-10-CM

## 2018-04-10 DIAGNOSIS — I519 Heart disease, unspecified: Secondary | ICD-10-CM

## 2018-04-10 DIAGNOSIS — E785 Hyperlipidemia, unspecified: Secondary | ICD-10-CM

## 2018-04-10 HISTORY — PX: LEFT HEART CATH AND CORONARY ANGIOGRAPHY: CATH118249

## 2018-04-10 LAB — CBC
HCT: 39.8 % (ref 39.0–52.0)
Hemoglobin: 13.1 g/dL (ref 13.0–17.0)
MCH: 29.4 pg (ref 26.0–34.0)
MCHC: 32.9 g/dL (ref 30.0–36.0)
MCV: 89.4 fL (ref 80.0–100.0)
Platelets: 289 10*3/uL (ref 150–400)
RBC: 4.45 MIL/uL (ref 4.22–5.81)
RDW: 12 % (ref 11.5–15.5)
WBC: 8 10*3/uL (ref 4.0–10.5)
nRBC: 0 % (ref 0.0–0.2)

## 2018-04-10 LAB — BASIC METABOLIC PANEL
Anion gap: 11 (ref 5–15)
BUN: 9 mg/dL (ref 6–20)
CO2: 25 mmol/L (ref 22–32)
Calcium: 9.2 mg/dL (ref 8.9–10.3)
Chloride: 105 mmol/L (ref 98–111)
Creatinine, Ser: 1.07 mg/dL (ref 0.61–1.24)
GFR calc Af Amer: >60 mL/min (ref >60)
GFR calc non Af Amer: >60 mL/min (ref >60)
Glucose, Bld: 120 mg/dL — ABNORMAL HIGH (ref 70–99)
POTASSIUM: 4.3 mmol/L (ref 3.5–5.1)
Sodium: 141 mmol/L (ref 135–145)

## 2018-04-10 LAB — HEPARIN LEVEL (UNFRACTIONATED): Heparin Unfractionated: 0.27 IU/mL — ABNORMAL LOW (ref 0.30–0.70)

## 2018-04-10 SURGERY — LEFT HEART CATH AND CORONARY ANGIOGRAPHY
Anesthesia: LOCAL

## 2018-04-10 SURGERY — ECHOCARDIOGRAM, TRANSESOPHAGEAL
Anesthesia: Moderate Sedation

## 2018-04-10 MED ORDER — SODIUM CHLORIDE 0.9 % WEIGHT BASED INFUSION: 1.0000 mL/kg/h | INTRAVENOUS | Status: DC

## 2018-04-10 MED ORDER — SODIUM CHLORIDE 0.9% FLUSH: 3.0000 mL | INTRAVENOUS | Status: DC | PRN

## 2018-04-10 MED ORDER — HEPARIN (PORCINE) IN NACL 1000-0.9 UT/500ML-% IV SOLN
INTRAVENOUS | Status: AC
  Filled 2018-04-10: qty 500

## 2018-04-10 MED ORDER — VERAPAMIL HCL 2.5 MG/ML IV SOLN
INTRAVENOUS | Status: AC
  Filled 2018-04-10: qty 2

## 2018-04-10 MED ORDER — ACETAMINOPHEN 325 MG PO TABS: 650.0000 mg | ORAL_TABLET | ORAL | Status: DC | PRN

## 2018-04-10 MED ORDER — SODIUM CHLORIDE 0.9 % IV SOLN: 250.0000 mL | INTRAVENOUS | Status: DC | PRN

## 2018-04-10 MED ORDER — TRAMADOL HCL 50 MG PO TABS
50.0000 mg | ORAL_TABLET | Freq: Four times a day (QID) | ORAL | Status: DC | PRN
  Administered 2018-04-10: 50 mg via ORAL
  Filled 2018-04-10: qty 1

## 2018-04-10 MED ORDER — MIDAZOLAM HCL 2 MG/2ML IJ SOLN
INTRAMUSCULAR | Status: AC
  Filled 2018-04-10: qty 2

## 2018-04-10 MED ORDER — SIMVASTATIN 40 MG PO TABS: 40.0000 mg | ORAL_TABLET | Freq: Every day | ORAL | 0 refills | Status: DC

## 2018-04-10 MED ORDER — SODIUM CHLORIDE 0.9 % IV SOLN: INTRAVENOUS | Status: DC

## 2018-04-10 MED ORDER — METOPROLOL TARTRATE 50 MG PO TABS: 50.0000 mg | ORAL_TABLET | Freq: Two times a day (BID) | ORAL | 0 refills | Status: DC

## 2018-04-10 MED ORDER — LIDOCAINE HCL (PF) 1 % IJ SOLN
INTRAMUSCULAR | Status: DC | PRN
  Administered 2018-04-10: 15 mL via INTRADERMAL
  Administered 2018-04-10: 2 mL via INTRADERMAL

## 2018-04-10 MED ORDER — IOHEXOL 350 MG/ML SOLN
INTRAVENOUS | Status: DC | PRN
  Administered 2018-04-10: 75 mL via INTRA_ARTERIAL

## 2018-04-10 MED ORDER — SODIUM CHLORIDE 0.9% FLUSH: 3.0000 mL | Freq: Two times a day (BID) | INTRAVENOUS | Status: DC

## 2018-04-10 MED ORDER — APIXABAN 5 MG PO TABS: 5.0000 mg | ORAL_TABLET | Freq: Two times a day (BID) | ORAL | 1 refills | Status: DC

## 2018-04-10 MED ORDER — SODIUM CHLORIDE 0.9 % WEIGHT BASED INFUSION
3.0000 mL/kg/h | INTRAVENOUS | Status: DC
  Administered 2018-04-10: 3 mL/kg/h via INTRAVENOUS

## 2018-04-10 MED ORDER — MORPHINE SULFATE (PF) 2 MG/ML IV SOLN: 2.0000 mg | INTRAVENOUS | Status: DC | PRN

## 2018-04-10 MED ORDER — ASPIRIN 81 MG PO CHEW
81.0000 mg | CHEWABLE_TABLET | ORAL | Status: AC
  Administered 2018-04-10: 81 mg via ORAL
  Filled 2018-04-10: qty 1

## 2018-04-10 MED ORDER — MIDAZOLAM HCL 2 MG/2ML IJ SOLN
INTRAMUSCULAR | Status: DC | PRN
  Administered 2018-04-10 (×2): 1 mg via INTRAVENOUS

## 2018-04-10 MED ORDER — MUPIROCIN 2 % EX OINT: 1.0000 "application " | TOPICAL_OINTMENT | Freq: Two times a day (BID) | CUTANEOUS | 0 refills | Status: DC

## 2018-04-10 MED ORDER — ASPIRIN 81 MG PO CHEW: 81.0000 mg | CHEWABLE_TABLET | ORAL | Status: DC

## 2018-04-10 MED ORDER — HEPARIN (PORCINE) IN NACL 1000-0.9 UT/500ML-% IV SOLN
INTRAVENOUS | Status: DC | PRN
  Administered 2018-04-10 (×2): 500 mL

## 2018-04-10 MED ORDER — LOSARTAN POTASSIUM 50 MG PO TABS: 50.0000 mg | ORAL_TABLET | Freq: Every day | ORAL | 0 refills | Status: DC

## 2018-04-10 MED ORDER — FENTANYL CITRATE (PF) 100 MCG/2ML IJ SOLN
INTRAMUSCULAR | Status: DC | PRN
  Administered 2018-04-10 (×2): 25 ug via INTRAVENOUS

## 2018-04-10 MED ORDER — SODIUM CHLORIDE 0.9 % WEIGHT BASED INFUSION: 3.0000 mL/kg/h | INTRAVENOUS | Status: DC

## 2018-04-10 MED ORDER — ASPIRIN 81 MG PO TBEC: 81.0000 mg | DELAYED_RELEASE_TABLET | Freq: Every day | ORAL | 0 refills | Status: AC

## 2018-04-10 MED ORDER — ONDANSETRON HCL 4 MG/2ML IJ SOLN: 4.0000 mg | Freq: Four times a day (QID) | INTRAMUSCULAR | Status: DC | PRN

## 2018-04-10 MED ORDER — FENTANYL CITRATE (PF) 100 MCG/2ML IJ SOLN
INTRAMUSCULAR | Status: AC
  Filled 2018-04-10: qty 2

## 2018-04-10 SURGICAL SUPPLY — 15 items
CATH INFINITI 5FR JL4 (CATHETERS) ×2 IMPLANT
CATH INFINITI 6F ANG MULTIPACK (CATHETERS) IMPLANT
CATH INFINITI JR4 5F (CATHETERS) ×2 IMPLANT
CLOSURE MYNX CONTROL 5F (Vascular Products) ×2 IMPLANT
GLIDESHEATH SLEND A-KIT 6F 22G (SHEATH) IMPLANT
GUIDEWIRE INQWIRE 1.5J.035X260 (WIRE) IMPLANT
INQWIRE 1.5J .035X260CM (WIRE)
KIT HEART LEFT (KITS) ×2 IMPLANT
PACK CARDIAC CATHETERIZATION (CUSTOM PROCEDURE TRAY) ×2 IMPLANT
SHEATH PINNACLE 5F 10CM (SHEATH) ×2 IMPLANT
SHEATH PROBE COVER 6X72 (BAG) ×2 IMPLANT
TRANSDUCER W/STOPCOCK (MISCELLANEOUS) ×2 IMPLANT
TUBING CIL FLEX 10 FLL-RA (TUBING) ×2 IMPLANT
WIRE EMERALD 3MM-J .035X150CM (WIRE) ×2 IMPLANT
WIRE HI TORQ VERSACORE-J 145CM (WIRE) ×2 IMPLANT

## 2018-04-10 NOTE — Progress Notes (Signed)
Progress Note  Patient Name: Keith Black Date of Encounter: 04/10/2018  Primary Cardiologist: Jenkins Rouge, MD    Subjective   Nervous seen in cath lab no chest pain or recurrent stroke symptoms  Inpatient Medications    Scheduled Meds: . [MAR Hold] aspirin EC  81 mg Oral Daily  . [MAR Hold] clopidogrel  75 mg Oral Daily  . [MAR Hold] losartan  50 mg Oral Daily  . [MAR Hold] metoprolol tartrate  50 mg Oral BID  . [MAR Hold] mometasone-formoterol  2 puff Inhalation BID  . [MAR Hold] mupirocin ointment  1 application Nasal BID  . [MAR Hold] pantoprazole  40 mg Oral Daily  . [MAR Hold] simvastatin  40 mg Oral q1800  . [MAR Hold] sodium chloride flush  3 mL Intravenous Once  . sodium chloride flush  3 mL Intravenous Q12H   Continuous Infusions: . sodium chloride    . sodium chloride    . heparin Stopped (04/09/18 1804)   PRN Meds: sodium chloride, [MAR Hold] acetaminophen **OR** [MAR Hold] acetaminophen (TYLENOL) oral liquid 160 mg/5 mL **OR** [MAR Hold] acetaminophen, Heparin (Porcine) in NaCl, [MAR Hold] LORazepam, [MAR Hold] senna-docusate, sodium chloride flush   Vital Signs    Vitals:   04/09/18 2330 04/10/18 0000 04/10/18 0612 04/10/18 0725  BP: 109/74     Pulse: 87     Resp: 16 19    Temp: 98 F (36.7 C)     TempSrc: Oral     SpO2: 96% 99%  100%  Weight:   79.3 kg   Height:        Intake/Output Summary (Last 24 hours) at 04/10/2018 0729 Last data filed at 04/10/2018 2563 Gross per 24 hour  Intake 54.43 ml  Output -  Net 54.43 ml   Last 3 Weights 04/10/2018 04/09/2018 12/16/2017  Weight (lbs) 174 lb 13.2 oz 176 lb 5.9 oz 177 lb 6.4 oz  Weight (kg) 79.3 kg 80 kg 80.468 kg      Telemetry    NSR no PaF  - Personally Reviewed  ECG    NSR poor R wave progression - Personally Reviewed  Physical Exam   GEN: No acute distress.   Neck: No JVD Cardiac: RRR, no murmurs, rubs, or gallops.  Respiratory: Clear to auscultation bilaterally. GI: Soft, nontender,  non-distended  MS: No edema; No deformity. Neuro:  Nonfocal  Psych: Normal affect   Labs    Chemistry Recent Labs  Lab 04/08/18 1024 04/10/18 0516  NA 137 141  K 4.6 4.3  CL 102 105  CO2 24 25  GLUCOSE 114* 120*  BUN 14 9  CREATININE 1.02 1.07  CALCIUM 9.2 9.2  PROT 7.9  --   ALBUMIN 4.5  --   AST 28  --   ALT 26  --   ALKPHOS 50  --   BILITOT 0.8  --   GFRNONAA >60 >60  GFRAA >60 >60  ANIONGAP 11 11     Hematology Recent Labs  Lab 04/08/18 1024 04/10/18 0516  WBC 6.4 8.0  RBC 4.97 4.45  HGB 14.7 13.1  HCT 44.5 39.8  MCV 89.5 89.4  MCH 29.6 29.4  MCHC 33.0 32.9  RDW 11.9 12.0  PLT 334 289    Cardiac EnzymesNo results for input(s): TROPONINI in the last 168 hours.  Recent Labs  Lab 04/08/18 1020  TROPIPOC 0.02     BNPNo results for input(s): BNP, PROBNP in the last 168 hours.   DDimer No  results for input(s): DDIMER in the last 168 hours.   Radiology    Ct Angio Head W Or Wo Contrast  Result Date: 04/08/2018 CLINICAL DATA:  53 y/o M; right-sided numbness. TIA/stroke, initial exam. EXAM: CT ANGIOGRAPHY HEAD AND NECK TECHNIQUE: Multidetector CT imaging of the head and neck was performed using the standard protocol during bolus administration of intravenous contrast. Multiplanar CT image reconstructions and MIPs were obtained to evaluate the vascular anatomy. Carotid stenosis measurements (when applicable) are obtained utilizing NASCET criteria, using the distal internal carotid diameter as the denominator. CONTRAST:  18mL ISOVUE-370 IOPAMIDOL (ISOVUE-370) INJECTION 76% COMPARISON:  04/08/2018 CT head and MRI head. FINDINGS: CTA NECK FINDINGS Aortic arch: Standard branching. Imaged portion shows no evidence of aneurysm or dissection. No significant stenosis of the major arch vessel origins. Mild aortic calcific atherosclerosis. Mixed plaque of left subclavian origin with mild less than 50% stenosis. Right carotid system: No evidence of dissection, stenosis  (50% or greater) or occlusion. Calcified plaque of the carotid bifurcation with mild less than 50% proximal ICA stenosis. Left carotid system: No evidence of dissection, stenosis (50% or greater) or occlusion. Non stenotic calcified plaque of the carotid bifurcation. Vertebral arteries: Bilateral vertebral artery origin dense calcified plaque with mild-to-moderate stenosis, accurate assessment of luminal stenosis is suboptimal due to dense calcification. Left dominant. No dissection or aneurysm. Skeleton: No acute finding. Mild discogenic degenerative changes at the C5-6 level. Other neck: Negative. Upper chest: Negative. Review of the MIP images confirms the above findings CTA HEAD FINDINGS Anterior circulation: No significant stenosis, proximal occlusion, aneurysm, or vascular malformation. Non stenotic calcific atherosclerosis of carotid siphons. Posterior circulation: No significant stenosis, proximal occlusion, aneurysm, or vascular malformation. Venous sinuses: As permitted by contrast timing, patent. Anatomic variants: Anterior communicating artery and right posterior communicating arteries. Possible diminutive left posterior communicating artery. Delayed phase: No abnormal intracranial enhancement. Review of the MIP images confirms the above findings IMPRESSION: 1. Patent carotid and vertebral arteries. No dissection, aneurysm, or hemodynamically significant stenosis utilizing NASCET criteria. 2. Patent anterior and posterior intracranial circulation. No large vessel occlusion, aneurysm, or significant stenosis. 3. Calcific atherosclerosis of aorta, carotid bifurcations, and carotid siphons. Electronically Signed   By: Kristine Garbe M.D.   On: 04/08/2018 19:13   Dg Chest 2 View  Result Date: 04/08/2018 CLINICAL DATA:  Pt c/o face numbness and right arm tingling x 1 day; pt is admitted for a TIA. Hx of HTN. Pt is a nonsmoker. EXAM: CHEST - 2 VIEW COMPARISON:  None. FINDINGS: The heart size and  mediastinal contours are within normal limits. Both lungs are clear. The visualized skeletal structures are unremarkable. IMPRESSION: No active cardiopulmonary disease. Electronically Signed   By: Nolon Nations M.D.   On: 04/08/2018 14:56   Ct Head Wo Contrast  Result Date: 04/08/2018 CLINICAL DATA:  Right sided numbness EXAM: CT HEAD WITHOUT CONTRAST TECHNIQUE: Contiguous axial images were obtained from the base of the skull through the vertex without intravenous contrast. COMPARISON:  None. FINDINGS: Brain: The brain shows a normal appearance without evidence of malformation, atrophy, old or acute small or large vessel infarction, mass lesion, hemorrhage, hydrocephalus or extra-axial collection. Vascular: No hyperdense vessel. No evidence of atherosclerotic calcification. Skull: Normal.  No traumatic finding.  No focal bone lesion. Sinuses/Orbits: Sinuses are clear. Orbits appear normal. Mastoids are clear. Other: None significant IMPRESSION: Normal head CT Electronically Signed   By: Nelson Chimes M.D.   On: 04/08/2018 11:26   Ct Angio Neck W Or  Wo Contrast  Result Date: 04/08/2018 CLINICAL DATA:  53 y/o M; right-sided numbness. TIA/stroke, initial exam. EXAM: CT ANGIOGRAPHY HEAD AND NECK TECHNIQUE: Multidetector CT imaging of the head and neck was performed using the standard protocol during bolus administration of intravenous contrast. Multiplanar CT image reconstructions and MIPs were obtained to evaluate the vascular anatomy. Carotid stenosis measurements (when applicable) are obtained utilizing NASCET criteria, using the distal internal carotid diameter as the denominator. CONTRAST:  63mL ISOVUE-370 IOPAMIDOL (ISOVUE-370) INJECTION 76% COMPARISON:  04/08/2018 CT head and MRI head. FINDINGS: CTA NECK FINDINGS Aortic arch: Standard branching. Imaged portion shows no evidence of aneurysm or dissection. No significant stenosis of the major arch vessel origins. Mild aortic calcific atherosclerosis. Mixed  plaque of left subclavian origin with mild less than 50% stenosis. Right carotid system: No evidence of dissection, stenosis (50% or greater) or occlusion. Calcified plaque of the carotid bifurcation with mild less than 50% proximal ICA stenosis. Left carotid system: No evidence of dissection, stenosis (50% or greater) or occlusion. Non stenotic calcified plaque of the carotid bifurcation. Vertebral arteries: Bilateral vertebral artery origin dense calcified plaque with mild-to-moderate stenosis, accurate assessment of luminal stenosis is suboptimal due to dense calcification. Left dominant. No dissection or aneurysm. Skeleton: No acute finding. Mild discogenic degenerative changes at the C5-6 level. Other neck: Negative. Upper chest: Negative. Review of the MIP images confirms the above findings CTA HEAD FINDINGS Anterior circulation: No significant stenosis, proximal occlusion, aneurysm, or vascular malformation. Non stenotic calcific atherosclerosis of carotid siphons. Posterior circulation: No significant stenosis, proximal occlusion, aneurysm, or vascular malformation. Venous sinuses: As permitted by contrast timing, patent. Anatomic variants: Anterior communicating artery and right posterior communicating arteries. Possible diminutive left posterior communicating artery. Delayed phase: No abnormal intracranial enhancement. Review of the MIP images confirms the above findings IMPRESSION: 1. Patent carotid and vertebral arteries. No dissection, aneurysm, or hemodynamically significant stenosis utilizing NASCET criteria. 2. Patent anterior and posterior intracranial circulation. No large vessel occlusion, aneurysm, or significant stenosis. 3. Calcific atherosclerosis of aorta, carotid bifurcations, and carotid siphons. Electronically Signed   By: Kristine Garbe M.D.   On: 04/08/2018 19:13   Mr Jeri Cos ZO Contrast  Result Date: 04/08/2018 CLINICAL DATA:  53 y/o  M; right-sided numbness. EXAM: MRI HEAD  WITHOUT AND WITH CONTRAST TECHNIQUE: Multiplanar, multiecho pulse sequences of the brain and surrounding structures were obtained without and with intravenous contrast. CONTRAST:  10 cc Gadavist COMPARISON:  04/08/2018 CT head and CTA head. FINDINGS: Brain: 12 mm left lateral precentral gyrus focus of cortical reduced diffusion compatible with acute/early subacute infarction. No hemorrhage or mass effect. Focus of infarction demonstrates faintly increased T2 FLAIR hyperintense signal abnormality. No extra-axial collection, hydrocephalus, mass effect, or herniation. Small focus of left medial orbitofrontal encephalomalacia, probably sequelae of prior traumatic brain injury given distribution. No additional structural or signal abnormality of the brain identified. After administration of intravenous contrast there is no abnormal enhancement of the brain Vascular: Normal flow voids. Skull and upper cervical spine: Normal marrow signal. Sinuses/Orbits: Negative. Other: None. IMPRESSION: 1. 12 mm acute/early subacute infarction within the left lateral precentral gyrus. No hemorrhage or mass effect. 2. Small focus of left medial orbitofrontal encephalomalacia, probably sequelae of prior traumatic brain injury. 3. Otherwise unremarkable MRI of the brain. These results will be called to the ordering clinician or representative by the Radiologist Assistant, and communication documented in the PACS or zVision Dashboard. Electronically Signed   By: Kristine Garbe M.D.   On: 04/08/2018 19:21  Mr Cardiac Morphology W Wo Contrast  Result Date: 04/10/2018 CLINICAL DATA:  Stroke LV apical Thrombus EXAM: CARDIAC MRI TECHNIQUE: The patient was scanned on a 1.5 Tesla GE magnet. A dedicated cardiac coil was used. Functional imaging was done using Fiesta sequences. 2,3, and 4 chamber views were done to assess for RWMA's. Modified Simpson's rule using a short axis stack was used to calculate an ejection fraction on a  dedicated work Conservation officer, nature. The patient received L cc of Multihance. After 10 minutes inversion recovery sequences were used to assess for infiltration and scar tissue. CONTRAST:  Gadavist FINDINGS: Atria were of normal sized. Normal aortic root 3.0 cm. No ASD/PFO. No pericardial effusion Normal RV size and function Normal MV, AV and TV. LV is mildly dilated. The distal anterior wall, apex and distal septum are thinned and akinetic. There is swirling of blood at the apex but no formed thrombus on post gadolinium long TI images. Quantitative EF is 33% (EDV 99 cc ESV 66 cc SV 33 cc) Delayed enhancement images with gadavist showed no transmural scar in the left ventricular myocardium IMPRESSION: 1.  Mild LVE EF 33% distal anterior wall, apical and septal akinesis 2.  No mural apical thrombus 3.  No transmural scar seen in LV myocardium 4.  Normal RV size and function Jenkins Rouge Electronically Signed   By: Jenkins Rouge M.D.   On: 04/10/2018 07:19    Cardiac Studies   TTE EF 35% anteroapical akinesis ? LV thrombus MRI EF 33%  No LV thrombus  Patient Profile     53 y.o. male admitted with stroke and right sided weakness. TTE with RWMA and ? LV thrombus not seen by MRI   Assessment & Plan    Stroke:  Clearly no thrombus on delayed gadolinium images MRI. Surprisingly despite thinning of myocardium and akinesis no transmural scar. Was swirling of blood at apex. In light of no other etiology for stroke would still consider anticoagulation for 3 months Cath today to see if mid LAD Is occluded Then start entresto and continue beta blocker. Will ask EP to see regarding possible ILR    Time spent reviewed TTE/MRI and discussing with colleagues and Dr Gwenlyn Found who is doing cath this am 45 minutes. No LV gram during cath   For questions or updates, please contact [place] Please consult [email or web address] for contact info under        Signed, Jenkins Rouge, MD  04/10/2018, 7:29 AM

## 2018-04-10 NOTE — Telephone Encounter (Signed)
New message   Patient's wife is wanting to see if he can get a medication for his anxiety. Please call to discuss.

## 2018-04-10 NOTE — Progress Notes (Signed)
TCTS consulted for CABG evaluation. °

## 2018-04-10 NOTE — Care Management Note (Signed)
Case Management Note  Patient Details  Name: Keith Black MRN: [id number] Date of Birth: Mar 17, 1965  Subjective/Objective:     Pt is s/p CVA. He is from home with spouse.  No PCP: CM provided him names of groups taking pts--pt wanted to arrange himself No DME.  No issues obtaining home meds.  No issues with transportation.               Action/Plan: Pt discharging home with self care. Pt starting on Eliquis. CM provided him 30 day free card and $10 co pay card.  Wife to provide supervision at home and transportation to home.  Expected Discharge Date:  04/10/18               Expected Discharge Plan:  Home/Self Care  In-House Referral:     Discharge planning Services  CM Consult(eliquis )  Post Acute Care Choice:    Choice offered to:     DME Arranged:    DME Agency:     HH Arranged:    HH Agency:     Status of Service:  Completed, signed off  If discussed at [employer] of Stay Meetings, dates discussed:    Additional Comments:  Pollie Friar, RN 04/10/2018, 4:03 PM

## 2018-04-10 NOTE — Progress Notes (Signed)
STROKE TEAM PROGRESS NOTE   INTERVAL HISTORY His wife Keith Black is at the bedside.   He remains neurologically stable without any recurrent symptoms. He is on IV heparin. 2-D echo showed left ventricle left PICA clot but this was not confirmed on TEE or cardiac MRI. Cardiac cath did show occluded LAD and patient will need cardiac bypass graft surgery. Cardiology and cardiothoracic surgery consult pending  Vitals:   04/10/18 0840 04/10/18 0844 04/10/18 0953 04/10/18 1128  BP:   (!) 167/91   Pulse: (!) 0 (!) 0 84 90  Resp:   17   Temp:    98.5 F (36.9 C)  TempSrc:    Oral  SpO2:   98% 99%  Weight:      Height:        CBC:  Recent Labs  Lab 04/08/18 1024 04/10/18 0516  WBC 6.4 8.0  NEUTROABS 3.7  --   HGB 14.7 13.1  HCT 44.5 39.8  MCV 89.5 89.4  PLT 334 720    Basic Metabolic Panel:  Recent Labs  Lab 04/08/18 1024 04/10/18 0516  NA 137 141  K 4.6 4.3  CL 102 105  CO2 24 25  GLUCOSE 114* 120*  BUN 14 9  CREATININE 1.02 1.07  CALCIUM 9.2 9.2   Lipid Panel:     Component Value Date/Time   CHOL 215 (H) 04/09/2018 0523   TRIG 201 (H) 04/09/2018 0523   HDL 59 04/09/2018 0523   CHOLHDL 3.6 04/09/2018 0523   VLDL 40 04/09/2018 0523   LDLCALC 116 (H) 04/09/2018 0523   HgbA1c:  Lab Results  Component Value Date   HGBA1C 5.7 (H) 04/09/2018   Urine Drug Screen: No results found for: LABOPIA, COCAINSCRNUR, LABBENZ, AMPHETMU, THCU, LABBARB  Alcohol Level No results found for: ETH  IMAGING Ct Angio Head W Or Wo Contrast  Result Date: 04/08/2018 CLINICAL DATA:  53 y/o M; right-sided numbness. TIA/stroke, initial exam. EXAM: CT ANGIOGRAPHY HEAD AND NECK TECHNIQUE: Multidetector CT imaging of the head and neck was performed using the standard protocol during bolus administration of intravenous contrast. Multiplanar CT image reconstructions and MIPs were obtained to evaluate the vascular anatomy. Carotid stenosis measurements (when applicable) are obtained utilizing NASCET  criteria, using the distal internal carotid diameter as the denominator. CONTRAST:  32mL ISOVUE-370 IOPAMIDOL (ISOVUE-370) INJECTION 76% COMPARISON:  04/08/2018 CT head and MRI head. FINDINGS: CTA NECK FINDINGS Aortic arch: Standard branching. Imaged portion shows no evidence of aneurysm or dissection. No significant stenosis of the major arch vessel origins. Mild aortic calcific atherosclerosis. Mixed plaque of left subclavian origin with mild less than 50% stenosis. Right carotid system: No evidence of dissection, stenosis (50% or greater) or occlusion. Calcified plaque of the carotid bifurcation with mild less than 50% proximal ICA stenosis. Left carotid system: No evidence of dissection, stenosis (50% or greater) or occlusion. Non stenotic calcified plaque of the carotid bifurcation. Vertebral arteries: Bilateral vertebral artery origin dense calcified plaque with mild-to-moderate stenosis, accurate assessment of luminal stenosis is suboptimal due to dense calcification. Left dominant. No dissection or aneurysm. Skeleton: No acute finding. Mild discogenic degenerative changes at the C5-6 level. Other neck: Negative. Upper chest: Negative. Review of the MIP images confirms the above findings CTA HEAD FINDINGS Anterior circulation: No significant stenosis, proximal occlusion, aneurysm, or vascular malformation. Non stenotic calcific atherosclerosis of carotid siphons. Posterior circulation: No significant stenosis, proximal occlusion, aneurysm, or vascular malformation. Venous sinuses: As permitted by contrast timing, patent. Anatomic variants: Anterior communicating artery  and right posterior communicating arteries. Possible diminutive left posterior communicating artery. Delayed phase: No abnormal intracranial enhancement. Review of the MIP images confirms the above findings IMPRESSION: 1. Patent carotid and vertebral arteries. No dissection, aneurysm, or hemodynamically significant stenosis utilizing NASCET  criteria. 2. Patent anterior and posterior intracranial circulation. No large vessel occlusion, aneurysm, or significant stenosis. 3. Calcific atherosclerosis of aorta, carotid bifurcations, and carotid siphons. Electronically Signed   By: Kristine Garbe M.D.   On: 04/08/2018 19:13   Ct Angio Neck W Or Wo Contrast  Result Date: 04/08/2018 CLINICAL DATA:  53 y/o M; right-sided numbness. TIA/stroke, initial exam. EXAM: CT ANGIOGRAPHY HEAD AND NECK TECHNIQUE: Multidetector CT imaging of the head and neck was performed using the standard protocol during bolus administration of intravenous contrast. Multiplanar CT image reconstructions and MIPs were obtained to evaluate the vascular anatomy. Carotid stenosis measurements (when applicable) are obtained utilizing NASCET criteria, using the distal internal carotid diameter as the denominator. CONTRAST:  62mL ISOVUE-370 IOPAMIDOL (ISOVUE-370) INJECTION 76% COMPARISON:  04/08/2018 CT head and MRI head. FINDINGS: CTA NECK FINDINGS Aortic arch: Standard branching. Imaged portion shows no evidence of aneurysm or dissection. No significant stenosis of the major arch vessel origins. Mild aortic calcific atherosclerosis. Mixed plaque of left subclavian origin with mild less than 50% stenosis. Right carotid system: No evidence of dissection, stenosis (50% or greater) or occlusion. Calcified plaque of the carotid bifurcation with mild less than 50% proximal ICA stenosis. Left carotid system: No evidence of dissection, stenosis (50% or greater) or occlusion. Non stenotic calcified plaque of the carotid bifurcation. Vertebral arteries: Bilateral vertebral artery origin dense calcified plaque with mild-to-moderate stenosis, accurate assessment of luminal stenosis is suboptimal due to dense calcification. Left dominant. No dissection or aneurysm. Skeleton: No acute finding. Mild discogenic degenerative changes at the C5-6 level. Other neck: Negative. Upper chest: Negative.  Review of the MIP images confirms the above findings CTA HEAD FINDINGS Anterior circulation: No significant stenosis, proximal occlusion, aneurysm, or vascular malformation. Non stenotic calcific atherosclerosis of carotid siphons. Posterior circulation: No significant stenosis, proximal occlusion, aneurysm, or vascular malformation. Venous sinuses: As permitted by contrast timing, patent. Anatomic variants: Anterior communicating artery and right posterior communicating arteries. Possible diminutive left posterior communicating artery. Delayed phase: No abnormal intracranial enhancement. Review of the MIP images confirms the above findings IMPRESSION: 1. Patent carotid and vertebral arteries. No dissection, aneurysm, or hemodynamically significant stenosis utilizing NASCET criteria. 2. Patent anterior and posterior intracranial circulation. No large vessel occlusion, aneurysm, or significant stenosis. 3. Calcific atherosclerosis of aorta, carotid bifurcations, and carotid siphons. Electronically Signed   By: Kristine Garbe M.D.   On: 04/08/2018 19:13   Mr Jeri Cos BJ Contrast  Result Date: 04/08/2018 CLINICAL DATA:  53 y/o  M; right-sided numbness. EXAM: MRI HEAD WITHOUT AND WITH CONTRAST TECHNIQUE: Multiplanar, multiecho pulse sequences of the brain and surrounding structures were obtained without and with intravenous contrast. CONTRAST:  10 cc Gadavist COMPARISON:  04/08/2018 CT head and CTA head. FINDINGS: Brain: 12 mm left lateral precentral gyrus focus of cortical reduced diffusion compatible with acute/early subacute infarction. No hemorrhage or mass effect. Focus of infarction demonstrates faintly increased T2 FLAIR hyperintense signal abnormality. No extra-axial collection, hydrocephalus, mass effect, or herniation. Small focus of left medial orbitofrontal encephalomalacia, probably sequelae of prior traumatic brain injury given distribution. No additional structural or signal abnormality of the  brain identified. After administration of intravenous contrast there is no abnormal enhancement of the brain Vascular: Normal flow voids.  Skull and upper cervical spine: Normal marrow signal. Sinuses/Orbits: Negative. Other: None. IMPRESSION: 1. 12 mm acute/early subacute infarction within the left lateral precentral gyrus. No hemorrhage or mass effect. 2. Small focus of left medial orbitofrontal encephalomalacia, probably sequelae of prior traumatic brain injury. 3. Otherwise unremarkable MRI of the brain. These results will be called to the ordering clinician or representative by the Radiologist Assistant, and communication documented in the PACS or zVision Dashboard. Electronically Signed   By: Kristine Garbe M.D.   On: 04/08/2018 19:21   Mr Cardiac Morphology W Wo Contrast  Result Date: 04/10/2018 CLINICAL DATA:  Stroke LV apical Thrombus EXAM: CARDIAC MRI TECHNIQUE: The patient was scanned on a 1.5 Tesla GE magnet. A dedicated cardiac coil was used. Functional imaging was done using Fiesta sequences. 2,3, and 4 chamber views were done to assess for RWMA's. Modified Simpson's rule using a short axis stack was used to calculate an ejection fraction on a dedicated work Conservation officer, nature. The patient received L cc of Multihance. After 10 minutes inversion recovery sequences were used to assess for infiltration and scar tissue. CONTRAST:  Gadavist FINDINGS: Atria were of normal sized. Normal aortic root 3.0 cm. No ASD/PFO. No pericardial effusion Normal RV size and function Normal MV, AV and TV. LV is mildly dilated. The distal anterior wall, apex and distal septum are thinned and akinetic. There is swirling of blood at the apex but no formed thrombus on post gadolinium long TI images. Quantitative EF is 33% (EDV 99 cc ESV 66 cc SV 33 cc) Delayed enhancement images with gadavist showed no transmural scar in the left ventricular myocardium IMPRESSION: 1.  Mild LVE EF 33% distal anterior wall,  apical and septal akinesis 2.  No mural apical thrombus 3.  No transmural scar seen in LV myocardium 4.  Normal RV size and function Keith Black Electronically Signed   By: Keith Black M.D.   On: 04/10/2018 07:19    PHYSICAL EXAM Pleasant middle-age Caucasian male currently not in distress. . Afebrile. Head is nontraumatic. Neck is supple without bruit.    Cardiac exam no murmur or gallop. Lungs are clear to auscultation. Distal pulses are well felt. Neurological Exam ;  Awake  Alert oriented x 3. Normal speech and language.eye movements full without nystagmus.fundi were not visualized. Vision acuity and fields appear normal. Hearing is normal. Palatal movements are normal. Face symmetric. Tongue midline. Normal strength, tone, reflexes and coordination. Normal sensation. Gait deferred.   ASSESSMENT/PLAN Keith Black is a 53 y.o. male with history of HTN and ocular migraine 2 mos ago x 1 presenting with R arm weakness, numbness and tingling.   Stroke:   Acute L cortical lateral precentral gyrus infarct in setting of TIA even 2 months ago and palpitations, concerned for embolic source  CT head normal  CTA head & neck atherosclerosis o/w normal  MRI  L lateral precentral gyrus infarct. Small focus old L medical orbitofrontal encephalomalacia   2D Echo  Small thrombus on the PICA wall of the left ventricle. Diminished ejection fraction 35-40%.  LDL 116  HgbA1c 5.7  Lovenox 40 mg sq daily for VTE prophylaxis  No antithrombotic prior to admission, now on aspirin 325 mg daily  Likely need anticoagulation for 6 months given left ventricular thrombus- warfarin versus eliquis to be decided by cardiology  Therapy recommendations:  no therapy needs  Disposition:  pending   30 mins continuous activity every day recommended for secondary stroke prevention  Hx  palpitations  Medical follow up - not AF  Hypertension  Elevated but Stable . Permissive hypertension (OK if < 220/120)  but gradually normalize in 5-7 days . Ok to resume home BP meds . Long-term BP goal normotensive  Hyperlipidemia  Home meds:  zocor 20, resumed in hospital  LDL 116, goal < 70  Increase zocor to 40 mg daily  Continue statin at discharge  Other Stroke Risk Factors  Family hx stroke (father deceased from stroke)  ETOH - 2-3 drinks daily after work  Hx HA - no dx Migraines prior to event below  Hx TIA  Hx HA - blurred vision R eye. L eye ok. R eye everything blurred. Saw his eye doctor who dx ocular migraine. This all happened 2 months ago  Mild Obstructive sleep apnea, prescribed CPAP but not using. OP sleep eval/advice recommended  Hospital day # 1    He presented with transient right face and arm paresthesias and weakness due to small embolic left MCA branch infarct of cardioembolic etiology from left ventricular apical wall thrombus  Seen on echo but after starting IV heparin it was not seen on subsequent PE or cardiac MRI. I think he remains at high short-term risk for further cardiac embolism and will benefit with anticoagulation. Recommend short-term anticoagulation with  eliquis . He also has significant coronary artery disease and needs cardiac bypass surgery. Discussed timing of surgery with Dr.Bartle and recommend wait at least 4 weeks. After cardiac surgery patient may be switched to antiplatelet therapy. Long discussion with the patient and wife and Dr.[place], Cyndia Bent and Nadean Corwin and answered questions. Greater than 50% time during this 35 minute visit was spent on counseling and coordination of care but is embolic stroke and discussion with multiple specialists about further workup and treatment.  . Patient may be discharged home and can follow-up as an outpatient in stroke clinic  Antony Contras, [place] Pager: [phone] 04/10/2018 3:13 PM  To contact Stroke Continuity provider, please refer to [email or web address] After hours, contact  General Neurology

## 2018-04-10 NOTE — Progress Notes (Signed)
Patient returned to the unit alert and Oriented X 4 Denies pain. Right side groin incision. Dressing in place, clean dry and intact. +2 Pedal pulses. All questions and concerns addressed Bed in the lowest position with bed alarm on Nurse will continue to monitor.

## 2018-04-10 NOTE — Discharge Summary (Signed)
Physician Discharge Summary  [name] NLG:[id number] DOB: January 13, 1966 DOA: 04/08/2018  PCP: Patient, [name]  Admit date: 04/08/2018 Discharge date: 04/10/2018  Admitted From: Inpatient Disposition Recommendations for Outpatient Follow-up:  1. Follow up with PCP in 1-2 weeks 2. Please obtain BMP/CBC in one week 3. Please follow up on the following pending results:  Home Health:No Equipment/Devices:none  Discharge Condition:Stable CODE STATUS:Full code Diet recommendation: Cardiac diet  Brief/Interim Summary: Keith Black  is a 53 y.o. male, with past medical history significant for hypertension and hyperlipidemia presenting today with an episode of right facial and right arm numbness and weakness that lasted around 1 minute.  Patient was concerned because of family history of CVA.  Symptoms returned back to normal.  No history of head trauma chest pains or palpitations.  No nausea vomiting or diarrhea.  No similar previous episodes.   In the emergency room work-up was negative with a negative CT of the head and labs. Blood pressure is on the high side and neuro was to see in consult  Hospital course: Acute CVA:  CT of the brain is normal, MRI showed 12 mm acute versus early subacute stroke.  A1c and cholesterol panel obtained with cholesterol showing total cholesterol 215 LDL 116 HDL 59 and triglycerides of 201.  Patient underwent echocardiogram as part of a stroke work-up which was concerning for possible thrombus.  Patient subsequently underwent cardiac MRI which revealed there was no thrombus noted.  He had a heart catheterization because of reduced EF noted which is concerning for multivessel disease and patient was subsequently seen by cardiothoracic surgery for CABG.  Because of the acute stroke neurology and cardiothoracic have elected to treat medically for approximately 1 month.  I discussed anticoagulation with neurology who recommended aspirin and Eliquis 5 twice daily.  Dr Leonie Man  recommended against Plavix given the nature of the vessel disease.  Patient was placed on aspirin, Eliquis, statin, losartan and metoprolol.  Patient will follow-up with stroke clinic.  CAD/Chronic systolic CHF.  Patient noted have an EF of 30 to 35% which was new.  B medications as noted above.  He was not volume overloaded and not in acute exacerbation.  Cath revealed ischemic disease with planned CABG in approximately 1 month.  Patient will follow-up with Dr. Roosvelt Harps as an outpatient.  Hypertension Patient blood pressure medications changed to losartan 50 mg p.o. daily metoprolol twice daily.  Hyperlipidemia On Zocor; this was increased to 40 mg p.o. daily which she will continue at home.  Prescription sent to his pharmacy  Reactive airway disease Continue with Symbicort    Discharge Diagnoses:  Principal Problem:   CVA (cerebral vascular accident) ([name]) Active Problems:   TIA (transient ischemic attack)   LV (left ventricular) mural thrombus without MI   Hyperlipidemia   Essential hypertension   Systolic dysfunction with heart failure [facility])    Discharge Instructions  Discharge Instructions    Ambulatory referral to Neurology   Complete by:  As directed    An appointment is requested in approximately: 1 week with Dr Leonie Man   Diet - low sodium heart healthy   Complete by:  As directed    Discharge instructions   Complete by:  As directed    Return to ER for any acute change in medical condition   Increase activity slowly   Complete by:  As directed      Allergies as of 04/10/2018   No Known Allergies     Medication List  TAKE these medications   ALIGN PO Take 1 capsule by mouth daily.   apixaban 5 MG Tabs tablet Commonly known as:  ELIQUIS Take 1 tablet (5 mg total) by mouth 2 (two) times daily for 30 days.   aspirin 81 MG EC tablet Take 1 tablet (81 mg total) by mouth daily for 30 days. Start taking on:  April 11, 2018   azelastine 0.1 % nasal  spray Commonly known as:  ASTELIN Place 2 sprays into both nostrils 2 (two) times daily.   budesonide-formoterol 80-4.5 MCG/ACT inhaler Commonly known as:  SYMBICORT Inhale 2 puffs into the lungs 2 (two) times daily.   cetirizine 10 MG tablet Commonly known as:  ZYRTEC Take 10 mg by mouth daily.   [employer] VIT D 5000 HIGH-POTENCY PO Take 1 capsule by mouth daily.   fluticasone 50 MCG/ACT nasal spray Commonly known as:  FLONASE Place 1 spray into both nostrils daily.   losartan 50 MG tablet Commonly known as:  COZAAR Take 1 tablet (50 mg total) by mouth daily for 30 days. Start taking on:  April 11, 2018 What changed:    medication strength  how much to take   metoprolol tartrate 50 MG tablet Commonly known as:  LOPRESSOR Take 1 tablet (50 mg total) by mouth 2 (two) times daily for 30 days.   mupirocin ointment 2 % Commonly known as:  BACTROBAN Place 1 application into the nose 2 (two) times daily.   pantoprazole 40 MG tablet Commonly known as:  PROTONIX Take 1 tablet (40 mg total) by mouth daily.   PROAIR HFA 108 (90 Base) MCG/ACT inhaler Generic drug:  albuterol Inhale 2 puffs into the lungs every 6 (six) hours as needed for wheezing or shortness of breath.   simvastatin 40 MG tablet Commonly known as:  ZOCOR Take 1 tablet (40 mg total) by mouth daily at 6 PM for 30 days. What changed:    medication strength  how much to take  when to take this   [place] Name: [facility] Follow up.   Why:  Use this to assist in finding PCP Contact information: [phone]         No Known Allergies  Consultations:  Cardiology   Cardiothoracic surgery   Procedures/Studies: Ct Angio Head W Or Wo Contrast  Result Date: 04/08/2018 CLINICAL DATA:  53 y/o M; right-sided numbness. TIA/stroke, initial exam. EXAM: CT ANGIOGRAPHY HEAD AND NECK TECHNIQUE: Multidetector CT imaging of the head and neck was  performed using the standard protocol during bolus administration of intravenous contrast. Multiplanar CT image reconstructions and MIPs were obtained to evaluate the vascular anatomy. Carotid stenosis measurements (when applicable) are obtained utilizing NASCET criteria, using the distal internal carotid diameter as the denominator. CONTRAST:  37mL ISOVUE-370 IOPAMIDOL (ISOVUE-370) INJECTION 76% COMPARISON:  04/08/2018 CT head and MRI head. FINDINGS: CTA NECK FINDINGS Aortic arch: Standard branching. Imaged portion shows no evidence of aneurysm or dissection. No significant stenosis of the major arch vessel origins. Mild aortic calcific atherosclerosis. Mixed plaque of left subclavian origin with mild less than 50% stenosis. Right carotid system: No evidence of dissection, stenosis (50% or greater) or occlusion. Calcified plaque of the carotid bifurcation with mild less than 50% proximal ICA stenosis. Left carotid system: No evidence of dissection, stenosis (50% or greater) or occlusion. Non stenotic calcified plaque of the carotid bifurcation. Vertebral arteries: Bilateral vertebral artery origin dense calcified plaque  with mild-to-moderate stenosis, accurate assessment of luminal stenosis is suboptimal due to dense calcification. Left dominant. No dissection or aneurysm. Skeleton: No acute finding. Mild discogenic degenerative changes at the C5-6 level. Other neck: Negative. Upper chest: Negative. Review of the MIP images confirms the above findings CTA HEAD FINDINGS Anterior circulation: No significant stenosis, proximal occlusion, aneurysm, or vascular malformation. Non stenotic calcific atherosclerosis of carotid siphons. Posterior circulation: No significant stenosis, proximal occlusion, aneurysm, or vascular malformation. Venous sinuses: As permitted by contrast timing, patent. Anatomic variants: Anterior communicating artery and right posterior communicating arteries. Possible diminutive left posterior  communicating artery. Delayed phase: No abnormal intracranial enhancement. Review of the MIP images confirms the above findings IMPRESSION: 1. Patent carotid and vertebral arteries. No dissection, aneurysm, or hemodynamically significant stenosis utilizing NASCET criteria. 2. Patent anterior and posterior intracranial circulation. No large vessel occlusion, aneurysm, or significant stenosis. 3. Calcific atherosclerosis of aorta, carotid bifurcations, and carotid siphons. Electronically Signed   By: Kristine Garbe M.D.   On: 04/08/2018 19:13   Dg Chest 2 View  Result Date: 04/08/2018 CLINICAL DATA:  Pt c/o face numbness and right arm tingling x 1 day; pt is admitted for a TIA. Hx of HTN. Pt is a nonsmoker. EXAM: CHEST - 2 VIEW COMPARISON:  None. FINDINGS: The heart size and mediastinal contours are within normal limits. Both lungs are clear. The visualized skeletal structures are unremarkable. IMPRESSION: No active cardiopulmonary disease. Electronically Signed   By: Nolon Nations M.D.   On: 04/08/2018 14:56   Ct Head Wo Contrast  Result Date: 04/08/2018 CLINICAL DATA:  Right sided numbness EXAM: CT HEAD WITHOUT CONTRAST TECHNIQUE: Contiguous axial images were obtained from the base of the skull through the vertex without intravenous contrast. COMPARISON:  None. FINDINGS: Brain: The brain shows a normal appearance without evidence of malformation, atrophy, old or acute small or large vessel infarction, mass lesion, hemorrhage, hydrocephalus or extra-axial collection. Vascular: No hyperdense vessel. No evidence of atherosclerotic calcification. Skull: Normal.  No traumatic finding.  No focal bone lesion. Sinuses/Orbits: Sinuses are clear. Orbits appear normal. Mastoids are clear. Other: None significant IMPRESSION: Normal head CT Electronically Signed   By: Nelson Chimes M.D.   On: 04/08/2018 11:26   Ct Angio Neck W Or Wo Contrast  Result Date: 04/08/2018 CLINICAL DATA:  53 y/o M; right-sided  numbness. TIA/stroke, initial exam. EXAM: CT ANGIOGRAPHY HEAD AND NECK TECHNIQUE: Multidetector CT imaging of the head and neck was performed using the standard protocol during bolus administration of intravenous contrast. Multiplanar CT image reconstructions and MIPs were obtained to evaluate the vascular anatomy. Carotid stenosis measurements (when applicable) are obtained utilizing NASCET criteria, using the distal internal carotid diameter as the denominator. CONTRAST:  98mL ISOVUE-370 IOPAMIDOL (ISOVUE-370) INJECTION 76% COMPARISON:  04/08/2018 CT head and MRI head. FINDINGS: CTA NECK FINDINGS Aortic arch: Standard branching. Imaged portion shows no evidence of aneurysm or dissection. No significant stenosis of the major arch vessel origins. Mild aortic calcific atherosclerosis. Mixed plaque of left subclavian origin with mild less than 50% stenosis. Right carotid system: No evidence of dissection, stenosis (50% or greater) or occlusion. Calcified plaque of the carotid bifurcation with mild less than 50% proximal ICA stenosis. Left carotid system: No evidence of dissection, stenosis (50% or greater) or occlusion. Non stenotic calcified plaque of the carotid bifurcation. Vertebral arteries: Bilateral vertebral artery origin dense calcified plaque with mild-to-moderate stenosis, accurate assessment of luminal stenosis is suboptimal due to dense calcification. Left dominant. No dissection or aneurysm.  Skeleton: No acute finding. Mild discogenic degenerative changes at the C5-6 level. Other neck: Negative. Upper chest: Negative. Review of the MIP images confirms the above findings CTA HEAD FINDINGS Anterior circulation: No significant stenosis, proximal occlusion, aneurysm, or vascular malformation. Non stenotic calcific atherosclerosis of carotid siphons. Posterior circulation: No significant stenosis, proximal occlusion, aneurysm, or vascular malformation. Venous sinuses: As permitted by contrast timing, patent.  Anatomic variants: Anterior communicating artery and right posterior communicating arteries. Possible diminutive left posterior communicating artery. Delayed phase: No abnormal intracranial enhancement. Review of the MIP images confirms the above findings IMPRESSION: 1. Patent carotid and vertebral arteries. No dissection, aneurysm, or hemodynamically significant stenosis utilizing NASCET criteria. 2. Patent anterior and posterior intracranial circulation. No large vessel occlusion, aneurysm, or significant stenosis. 3. Calcific atherosclerosis of aorta, carotid bifurcations, and carotid siphons. Electronically Signed   By: Kristine Garbe M.D.   On: 04/08/2018 19:13   Mr Jeri Cos XB Contrast  Result Date: 04/08/2018 CLINICAL DATA:  53 y/o  M; right-sided numbness. EXAM: MRI HEAD WITHOUT AND WITH CONTRAST TECHNIQUE: Multiplanar, multiecho pulse sequences of the brain and surrounding structures were obtained without and with intravenous contrast. CONTRAST:  10 cc Gadavist COMPARISON:  04/08/2018 CT head and CTA head. FINDINGS: Brain: 12 mm left lateral precentral gyrus focus of cortical reduced diffusion compatible with acute/early subacute infarction. No hemorrhage or mass effect. Focus of infarction demonstrates faintly increased T2 FLAIR hyperintense signal abnormality. No extra-axial collection, hydrocephalus, mass effect, or herniation. Small focus of left medial orbitofrontal encephalomalacia, probably sequelae of prior traumatic brain injury given distribution. No additional structural or signal abnormality of the brain identified. After administration of intravenous contrast there is no abnormal enhancement of the brain Vascular: Normal flow voids. Skull and upper cervical spine: Normal marrow signal. Sinuses/Orbits: Negative. Other: None. IMPRESSION: 1. 12 mm acute/early subacute infarction within the left lateral precentral gyrus. No hemorrhage or mass effect. 2. Small focus of left medial  orbitofrontal encephalomalacia, probably sequelae of prior traumatic brain injury. 3. Otherwise unremarkable MRI of the brain. These results will be called to the ordering clinician or representative by the Radiologist Assistant, and communication documented in the PACS or zVision Dashboard. Electronically Signed   By: Kristine Garbe M.D.   On: 04/08/2018 19:21   Mr Cardiac Morphology W Wo Contrast  Result Date: 04/10/2018 CLINICAL DATA:  Stroke LV apical Thrombus EXAM: CARDIAC MRI TECHNIQUE: The patient was scanned on a 1.5 Tesla GE magnet. A dedicated cardiac coil was used. Functional imaging was done using Fiesta sequences. 2,3, and 4 chamber views were done to assess for RWMA's. Modified Simpson's rule using a short axis stack was used to calculate an ejection fraction on a dedicated work Conservation officer, nature. The patient received L cc of Multihance. After 10 minutes inversion recovery sequences were used to assess for infiltration and scar tissue. CONTRAST:  Gadavist FINDINGS: Atria were of normal sized. Normal aortic root 3.0 cm. No ASD/PFO. No pericardial effusion Normal RV size and function Normal MV, AV and TV. LV is mildly dilated. The distal anterior wall, apex and distal septum are thinned and akinetic. There is swirling of blood at the apex but no formed thrombus on post gadolinium long TI images. Quantitative EF is 33% (EDV 99 cc ESV 66 cc SV 33 cc) Delayed enhancement images with gadavist showed no transmural scar in the left ventricular myocardium IMPRESSION: 1.  Mild LVE EF 33% distal anterior wall, apical and septal akinesis 2.  No mural apical  thrombus 3.  No transmural scar seen in LV myocardium 4.  Normal RV size and function [place] Electronically Signed   By: Jenkins Rouge M.D.   On: 04/10/2018 07:19       Subjective:   Discharge Exam: Vitals:   04/10/18 0953 04/10/18 1128  BP: (!) 167/91   Pulse: 84 90  Resp: 17   Temp:  98.5 F (36.9 C)  SpO2: 98%  99%   Vitals:   04/10/18 0840 04/10/18 0844 04/10/18 0953 04/10/18 1128  BP:   (!) 167/91   Pulse: (!) 0 (!) 0 84 90  Resp:   17   Temp:    98.5 F (36.9 C)  TempSrc:    Oral  SpO2:   98% 99%  Weight:      Height:        General: Pt is alert, awake, not in acute distress Cardiovascular: RRR, S1/S2 +, no rubs, no gallops Respiratory: CTA bilaterally, no wheezing, no rhonchi Abdominal: Soft, NT, ND, bowel sounds + Extremities: no edema, no cyanosis    The results of significant diagnostics from this hospitalization (including imaging, microbiology, ancillary and laboratory) are listed below for reference.     Microbiology: Recent Results (from the past 240 hour(s))  Surgical PCR screen     Status: None   Collection Time: 04/09/18  8:29 PM  Result Value Ref Range Status   MRSA, PCR NEGATIVE NEGATIVE Final   Staphylococcus aureus NEGATIVE NEGATIVE Final    Comment: (NOTE) The Xpert SA Assay (FDA approved for NASAL specimens in patients 52 years of age and older), is one component of a comprehensive surveillance program. It is not intended to diagnose infection nor to guide or monitor treatment. Performed at [facility], [place] [address], [place], [place] [address]      Labs: BNP (last 3 results) No results for input(s): BNP in the last 8760 hours. Basic Metabolic Panel: Recent Labs  Lab 04/08/18 1024 04/10/18 0516  NA 137 141  K 4.6 4.3  CL 102 105  CO2 24 25  GLUCOSE 114* 120*  BUN 14 9  CREATININE 1.02 1.07  CALCIUM 9.2 9.2   Liver Function Tests: Recent Labs  Lab 04/08/18 1024  AST 28  ALT 26  ALKPHOS 50  BILITOT 0.8  PROT 7.9  ALBUMIN 4.5   No results for input(s): LIPASE, AMYLASE in the last 168 hours. No results for input(s): AMMONIA in the last 168 hours. CBC: Recent Labs  Lab 04/08/18 1024 04/10/18 0516  WBC 6.4 8.0  NEUTROABS 3.7  --   HGB 14.7 13.1  HCT 44.5 39.8  MCV 89.5 89.4  PLT 334 289   Cardiac Enzymes: No results  for input(s): CKTOTAL, CKMB, CKMBINDEX, TROPONINI in the last 168 hours. BNP: Invalid input(s): POCBNP CBG: Recent Labs  Lab 04/08/18 1027  GLUCAP 102*   D-Dimer No results for input(s): DDIMER in the last 72 hours. Hgb A1c Recent Labs    04/09/18 0523  HGBA1C 5.7*   Lipid Profile Recent Labs    04/09/18 0523  CHOL 215*  HDL 59  LDLCALC 116*  TRIG 201*  CHOLHDL 3.6   Thyroid function studies No results for input(s): TSH, T4TOTAL, T3FREE, THYROIDAB in the last 72 hours.  Invalid input(s): FREET3 Anemia work up No results for input(s): VITAMINB12, FOLATE, FERRITIN, TIBC, IRON, RETICCTPCT in the last 72 hours. Urinalysis No results found for: COLORURINE, APPEARANCEUR, [place], [place], [place], [place], [place], [place], [place], [place], NITRITE, LEUKOCYTESUR  Sepsis Labs Invalid input(s): PROCALCITONIN,  WBC,  LACTICIDVEN Microbiology Recent Results (from the past 240 hour(s))  Surgical PCR screen     Status: None   Collection Time: 04/09/18  8:29 PM  Result Value Ref Range Status   MRSA, PCR NEGATIVE NEGATIVE Final   Staphylococcus aureus NEGATIVE NEGATIVE Final    Comment: (NOTE) The Xpert SA Assay (FDA approved for NASAL specimens in patients 63 years of age and older), is one component of a comprehensive surveillance program. It is not intended to diagnose infection nor to guide or monitor treatment. Performed at [facility], [place] [address] [place], [place] [address]      Time coordinating discharge: Over 30 minutes  SIGNED:   Nicolette Bang, MD  [facility] 04/10/2018, 2:56 PM Pager   If 7PM-7AM, please contact night-coverage [email or web address] Password [email or web address]

## 2018-04-10 NOTE — Telephone Encounter (Signed)
Will forward to Dr.Nishan. 

## 2018-04-10 NOTE — Interval H&P Note (Signed)
Cath Lab Visit (complete for each Cath Lab visit)  Clinical Evaluation Leading to the Procedure:   ACS: No.  Non-ACS:    Anginal Classification: No Symptoms  Anti-ischemic medical therapy: No Therapy  Non-Invasive Test Results: No non-invasive testing performed  Prior CABG: No previous CABG      History and Physical Interval Note:  04/10/2018 7:48 AM  Keith Black  has presented today for surgery, with the diagnosis of cardiomyopathy  The various methods of treatment have been discussed with the patient and family. After consideration of risks, benefits and other options for treatment, the patient has consented to  Procedure(s): LEFT HEART CATH AND CORONARY ANGIOGRAPHY (N/A) as a surgical intervention .  The patient's history has been reviewed, patient examined, no change in status, stable for surgery.  I have reviewed the patient's chart and labs.  Questions were answered to the patient's satisfaction.     Quay Burow

## 2018-04-10 NOTE — Progress Notes (Signed)
Patient is off the floor in a procedure.

## 2018-04-10 NOTE — Consult Note (Signed)
[address]       ,[place] [address]             [phone]        Keith Black Medical Record #[id number] Date of Birth: 04/07/1965  Referring: No ref. provider found Primary Care: Patient, [name] Primary Cardiologist:Keith Johnsie Cancel, MD  Chief Complaint:    Chief Complaint  Patient presents with  . Stroke Symptoms    History of Present Illness:    Patient is a 53 year old male who presented with stroke symptoms 2 days ago to the emergency department.  He has a past medical history significant for hypertension and hyperlipidemia.  He presented with right-sided facial and right arm numbness and weakness that lasted approximately 1 minute.  The patient became concerned because he has a family history of CVA.  The symptoms resolved quickly.  There was a negative CT scan of the head.  A neurology consultation was obtained.  Further testing was done including MRI of the brain.  Additionally CTA of the head and neck.  Also transthoracic echocardiogram.  He does have a history of ocular migraines.  The CT of the head was normal.  The CTA of the head and neck showed atherosclerosis but was otherwise normal.  MRI showed a left lateral precentral gyrus infarct.  Small old focus left medial orbital frontal encephalomalacia.  He was noted to have diminished ejection fraction on echocardiogram in the range of 35 to 40%.  Findings were most consistent with a small embolic left MCA branch infarct of cardioembolic etiology from left ventricle apical wall thrombus.  Neurology recommended short-term anticoagulation with IV heparin and switch to oral warfarin or Eliquis but cardiology consultation would be required.  They saw the patient who evaluated the patient and studies and recommended cardiac catheterization.  This was done on today's date.  Full report is listed below.  Echocardiogram report is also described below.  Are asked to see the patient in cardiothoracic surgical  consultation for consideration of CABG due to the severity of the anatomic findings.   Current Activity/ Functional Status: Patient is independent with mobility/ambulation, transfers, ADL's, IADL's.   Zubrod Score: At the time of surgery this patient's most appropriate activity status/level should be described as: [x]     0    Normal activity, no symptoms []     1    Restricted in physical strenuous activity but ambulatory, able to do out light work []     2    Ambulatory and capable of self care, unable to do work activities, up and about                 more than 50%  Of the time                            []     3    Only limited self care, in bed greater than 50% of waking hours []     4    Completely disabled, no self care, confined to bed or chair []     5    Moribund  Past Medical History:  Diagnosis Date  . CVA (cerebrovascular accident) ([facility]) 04/08/2018  . GERD (gastroesophageal reflux disease)   . High cholesterol   . Hypertension   . LV (left ventricular) mural thrombus without MI   . Ocular migraine     Past Surgical History:  Procedure Laterality  Date  . HERNIA REPAIR Bilateral     Social History   Tobacco Use  Smoking Status Never Smoker  Smokeless Tobacco Current User  . Types: Snuff    Social History   Substance and Sexual Activity  Alcohol Use Yes  . Alcohol/week: 25.0 standard drinks  . Types: 25 Glasses of wine per week     No Known Allergies  Current Facility-Administered Medications  Medication Dose Route Frequency Provider Last Rate Last Dose  . 0.9 %  sodium chloride infusion   Intravenous Continuous Lorretta Harp, MD      . 0.9 %  sodium chloride infusion  250 mL Intravenous PRN Lorretta Harp, MD      . acetaminophen (TYLENOL) tablet 650 mg  650 mg Oral Q4H PRN Lorretta Harp, MD   650 mg at 04/10/18 1039   Or  . acetaminophen (TYLENOL) solution 650 mg  650 mg Per Tube Q4H PRN Lorretta Harp, MD       Or  . acetaminophen  (TYLENOL) suppository 650 mg  650 mg Rectal Q4H PRN Lorretta Harp, MD      . aspirin EC tablet 81 mg  81 mg Oral Daily Lorretta Harp, MD      . clopidogrel (PLAVIX) tablet 75 mg  75 mg Oral Daily Lorretta Harp, MD   75 mg at 04/09/18 1214  . LORazepam (ATIVAN) tablet 0.5 mg  0.5 mg Oral Q6H PRN Lorretta Harp, MD   0.5 mg at 04/09/18 2356  . [START ON 04/11/2018] losartan (COZAAR) tablet 50 mg  50 mg Oral Daily Lorretta Harp, MD      . metoprolol tartrate (LOPRESSOR) tablet 50 mg  50 mg Oral BID Lorretta Harp, MD   50 mg at 04/10/18 1039  . mometasone-formoterol (DULERA) 100-5 MCG/ACT inhaler 2 puff  2 puff Inhalation BID Lorretta Harp, MD      . morphine 2 MG/ML injection 2 mg  2 mg Intravenous Q1H PRN Lorretta Harp, MD      . mupirocin ointment (BACTROBAN) 2 % 1 application  1 application Nasal BID Lorretta Harp, MD   1 application at 69/48/54 1040  . ondansetron (ZOFRAN) injection 4 mg  4 mg Intravenous Q6H PRN Lorretta Harp, MD      . pantoprazole (PROTONIX) EC tablet 40 mg  40 mg Oral Daily Lorretta Harp, MD   40 mg at 04/10/18 1039  . senna-docusate (Senokot-S) tablet 1 tablet  1 tablet Oral QHS PRN Lorretta Harp, MD      . simvastatin (ZOCOR) tablet 40 mg  40 mg Oral q1800 Lorretta Harp, MD   40 mg at 04/09/18 1716  . sodium chloride flush (NS) 0.9 % injection 3 mL  3 mL Intravenous Once Lorretta Harp, MD      . sodium chloride flush (NS) 0.9 % injection 3 mL  3 mL Intravenous Q12H Lorretta Harp, MD      . sodium chloride flush (NS) 0.9 % injection 3 mL  3 mL Intravenous PRN Lorretta Harp, MD        Medications Prior to Admission  Medication Sig Dispense Refill Last Dose  . albuterol (PROAIR HFA) 108 (90 Base) MCG/ACT inhaler Inhale 2 puffs into the lungs every 6 (six) hours as needed for wheezing or shortness of breath.   unk  . azelastine (ASTELIN) 0.1 % nasal spray Place 2 sprays into both nostrils 2 (  two) times daily.    unk   . cetirizine (ZYRTEC) 10 MG tablet Take 10 mg by mouth daily.   unk  . Cholecalciferol ([employer] VIT D 5000 HIGH-POTENCY PO) Take 1 capsule by mouth daily.   04/08/2018 at Unknown time  . fluticasone (FLONASE) 50 MCG/ACT nasal spray Place 1 spray into both nostrils daily.   unk  . losartan (COZAAR) 100 MG tablet Take 100 mg by mouth daily.   04/08/2018 at Unknown time  . pantoprazole (PROTONIX) 40 MG tablet Take 1 tablet (40 mg total) by mouth daily. 90 tablet 1 04/08/2018 at Unknown time  . Probiotic Product (ALIGN PO) Take 1 capsule by mouth daily.   unk  . simvastatin (ZOCOR) 20 MG tablet Take 20 mg by mouth daily.   04/08/2018 at Unknown time  . budesonide-formoterol (SYMBICORT) 80-4.5 MCG/ACT inhaler Inhale 2 puffs into the lungs 2 (two) times daily. (Patient not taking: Reported on 04/08/2018) 1 Inhaler 11 Not Taking at Unknown time  . UNABLE TO FIND Med Name: Bio Kult   Taking    Family History  Problem Relation Age of Onset  . Allergies Mother   . Asthma Mother   . Heart disease Mother 74       had stents and then CABG  . Stroke Father 16   LEFT HEART CATH AND CORONARY ANGIOGRAPHY  Conclusion     Ost 1st Sept to 1st Sept lesion is 90% stenosed.  Prox LAD-1 lesion is 80% stenosed.  Prox LAD-2 lesion is 100% stenosed.  Ost 1st Diag lesion is 100% stenosed.  Mid RCA lesion is 80% stenosed.   Rion Catala is a 53 y.o. male    [id number] LOCATION:  FACILITY: [facility]  PHYSICIAN: Quay Burow, M.D. August 03, 1965   DATE OF PROCEDURE:  04/10/2018  DATE OF DISCHARGE:     CARDIAC CATHETERIZATION     History o btained from chart review.53 y.o.maleadmitted with stroke and right sided weakness. TTE with RWMA and ? LV thrombus not seen by MRI.  Because of the nontransmural scar noted on MRI suggesting viability and the regional wall motion abnormality of unknown etiology it was decided to proceed with coronary angiography to define his anatomy.   IMPRESSION: Mr. Stolz has  an LAD CTO with a diffusely diseased vessel both proximal mid.  There was faint left to left collaterals and faint right to left collaterals with a significant mid RCA stenosis and an anterior wall motion abnormality.  Apparently his anterior wall has nontransmural scar suggesting continued viability.  I do not think his LAD is percutaneously addressable.  I suspect CABG is the best option for providing adequate perfusion to the anterior wall to promote improvement in LV function.  He is already on Plavix which will need to be discontinued and washed out.  This was started because of recent stroke.  He is completely asymptomatic and I suspect his surgical revascularization procedure should be delayed until he adequately recovers from this.  His right groin was hemostatically sealed with a MYNX device.  He left the lab in stable condition.  Quay Burow. MD, [facility] 04/10/2018 8:44 AM      Recommendations   Antiplatelet/Anticoag Recommend Aspirin 81mg  daily for moderate CAD.  Indications   Left ventricular dysfunction [I51.9 (ICD-10-CM)]  Procedural Details   Technical Details PROCEDURE DESCRIPTION:   The patient was brought to the second floor [facility] cath lab in the postabsorptive state. He was premedicated with IV Versed and fentanyl. His right groinwas  prepped and shaved in usual sterile fashion. Xylocaine 1% was used for local anesthesia. A 5 French sheath was inserted into the common femoral artery using standard Seldinger technique. 5 French right left Judkins diagnostic catheters were used for selective coronary angiography. Left ventriculography is not performed because 2D echo and MRI has already been done and there was a question of potential LV mural thrombus. Isovue dye was used for the entirety of the case. Retrograde aortic pressure was monitored during the case. A MYNX closure device was successfully deployed achieving hemostasis.  Estimated blood loss <50 mL.    During this procedure medications were administered to achieve and maintain moderate conscious sedation while the patient's heart rate, blood pressure, and oxygen saturation were continuously monitored and I was present face-to-face 100% of this time.  Medications  (Filter: Administrations occurring from 04/10/18 0715 to 04/10/18 0847)  Medication Rate/Dose/Volume Action  Date Time   Heparin (Porcine) in NaCl 1000-0.9 UT/500ML-% SOLN (mL) 500 mL Given 04/10/18 0727   Total dose as of 04/10/18 1120 500 mL Given 0728   1,000 mL        fentaNYL (SUBLIMAZE) injection (mcg) 25 mcg Given 04/10/18 0750   Total dose as of 04/10/18 1120 25 mcg Given 0806   50 mcg        midazolam (VERSED) injection (mg) 1 mg Given 04/10/18 0750   Total dose as of 04/10/18 1120 1 mg Given 0806   2 mg        lidocaine (PF) (XYLOCAINE) 1 % injection (mL) 2 mL Given 04/10/18 0756   Total dose as of 04/10/18 1120 15 mL Given 0805   17 mL        iohexol (OMNIPAQUE) 350 MG/ML injection (mL) 75 mL Given 04/10/18 0832   Total dose as of 04/10/18 1120        75 mL        aspirin EC tablet 81 mg (mg) *Not included in total [facility] Hold 04/10/18 0715   Dosing weight:  80 kg        Total dose as of 04/10/18 1120        Cannot be calculated        clopidogrel (PLAVIX) tablet 75 mg (mg) *Not included in total [facility] Hold 04/10/18 0715   Dosing weight:  80 kg        Total dose as of 04/10/18 1120        Cannot be calculated        LORazepam (ATIVAN) tablet 0.5 mg (mg) *Not included in total [facility] Hold 04/10/18 0715   Dosing weight:  80 kg        Total dose as of 04/10/18 1120        Cannot be calculated        losartan (COZAAR) tablet 50 mg (mg) *Not included in total [facility] Hold 04/10/18 0715   Total dose as of 04/10/18 1120        Cannot be calculated        metoprolol tartrate (LOPRESSOR) tablet 50 mg (mg) *Not included in total [facility] Hold 04/10/18 0715   Dosing weight:  80 kg        Total dose as of 04/10/18 1120        Cannot  be calculated        mometasone-formoterol (DULERA) 100-5 MCG/ACT inhaler 2 puff (puff) *Not included in total [facility] Hold 04/10/18 0715   Total dose as of 04/10/18 1120 *Not included in  total [facility] Held 0800   Cannot be calculated        mupirocin ointment (BACTROBAN) 2 % 1 application (application) *Not included in total [facility] Hold 04/10/18 0715   Dosing weight:  80 kg        Total dose as of 04/10/18 1120        Cannot be calculated        pantoprazole (PROTONIX) EC tablet 40 mg (mg) *Not included in total [facility] Hold 04/10/18 0715   Total dose as of 04/10/18 1120        Cannot be calculated        senna-docusate (Senokot-S) tablet 1 tablet (tablet) *Not included in total [facility] Hold 04/10/18 0715   Total dose as of 04/10/18 1120        Cannot be calculated        simvastatin (ZOCOR) tablet 40 mg (mg) *Not included in total [facility] Hold 04/10/18 0715   Total dose as of 04/10/18 1120        Cannot be calculated        sodium chloride flush (NS) 0.9 % injection 3 mL (mL) *Not included in total [facility] Hold 04/10/18 0715   Total dose as of 04/10/18 1120        Cannot be calculated        Sedation Time   Sedation Time Physician-1: 39 minutes 44 seconds  Coronary Findings   Diagnostic  Dominance: Right  Left Anterior Descending  Collaterals  Mid LAD filled by collaterals from [facility].    Prox LAD-1 lesion 80% stenosed  Prox LAD-1 lesion is 80% stenosed.  Prox LAD-2 lesion 100% stenosed  Prox LAD-2 lesion is 100% stenosed.  First Diagonal Branch  Ost 1st Diag lesion 100% stenosed  Ost 1st Diag lesion is 100% stenosed.  First Septal Edmon Crape 1st Sept to 1st Sept lesion 90% stenosed  Ost 1st Sept to 1st Sept lesion is 90% stenosed.  Right Coronary Artery  Mid RCA lesion 80% stenosed  Mid RCA lesion is 80% stenosed.  Intervention   No interventions have been documented.  Coronary Diagrams   Diagnostic  Dominance: Right    Intervention     Review of Systems:   Review of  Systems  Constitutional: Positive for malaise/fatigue. Negative for chills, diaphoresis, fever and weight loss.  HENT: Positive for congestion, sinus pain and sore throat. Negative for ear discharge, ear pain, hearing loss, nosebleeds and tinnitus.   Eyes: Positive for blurred vision. Negative for double vision, photophobia, pain, discharge and redness.  Respiratory: Positive for cough. Negative for hemoptysis, sputum production, shortness of breath, wheezing and stridor.   Cardiovascular: Positive for chest pain and palpitations. Negative for orthopnea, claudication, leg swelling and PND.  Gastrointestinal: Positive for heartburn and nausea. Negative for abdominal pain, blood in stool, constipation, diarrhea, melena and vomiting.  Genitourinary: Positive for flank pain and frequency. Negative for dysuria, hematuria and urgency.  Musculoskeletal: Positive for myalgias. Negative for back pain, falls, joint pain and neck pain.  Skin: Negative for itching and rash.  Neurological: Positive for dizziness, tingling, sensory change, focal weakness, weakness and headaches. Negative for tremors, speech change, seizures and loss of consciousness.  Endo/Heme/Allergies: Positive for environmental allergies and polydipsia. Does not bruise/bleed easily.  Psychiatric/Behavioral: Negative for depression, hallucinations, memory loss, substance abuse and suicidal ideas. The patient is nervous/anxious and has insomnia.         Physical Exam: BP (!) 167/91 (BP Location: Right Arm)  Pulse 84   Temp 98 F (36.7 C) (Oral)   Resp 17   Ht 5\' 8"  (1.727 m)   Wt 79.3 kg   SpO2 98%   BMI 26.58 kg/m    Physical Exam  Constitutional: He appears healthy. No distress.  HENT:  Nose: No nasal discharge.  Mouth/Throat: No dental caries. Oropharynx is clear. Pharynx is normal.  Eyes: Pupils are equal, round, and reactive to light. Conjunctivae are normal.  Neck: Normal range of motion and thyroid normal. No JVD  present. No neck adenopathy. No thyromegaly present.  Cardiovascular: Normal rate, regular rhythm, normal heart sounds, intact distal pulses and normal pulses. Exam reveals no gallop and no distant heart sounds.  No murmur heard. Pulmonary/Chest: Effort normal and breath sounds normal. He has no wheezes. He has no rales. He exhibits no tenderness.  Abdominal: Soft. Bowel sounds are normal. He exhibits no mass. There is no hepatomegaly. There is no abdominal tenderness.  Musculoskeletal:        General: No tenderness, deformity or edema.  Neurological: He is alert and oriented to person, place, and time. He has normal motor skills.  Skin: Skin is warm and dry. No rash noted. No cyanosis. No jaundice, pallor or plethora. Nails show no clubbing.   Diagnostic Studies & Laboratory data:     Recent Radiology Findings:   Ct Angio Head W Or Wo Contrast  Result Date: 04/08/2018 CLINICAL DATA:  53 y/o M; right-sided numbness. TIA/stroke, initial exam. EXAM: CT ANGIOGRAPHY HEAD AND NECK TECHNIQUE: Multidetector CT imaging of the head and neck was performed using the standard protocol during bolus administration of intravenous contrast. Multiplanar CT image reconstructions and MIPs were obtained to evaluate the vascular anatomy. Carotid stenosis measurements (when applicable) are obtained utilizing NASCET criteria, using the distal internal carotid diameter as the denominator. CONTRAST:  24mL ISOVUE-370 IOPAMIDOL (ISOVUE-370) INJECTION 76% COMPARISON:  04/08/2018 CT head and MRI head. FINDINGS: CTA NECK FINDINGS Aortic arch: Standard branching. Imaged portion shows no evidence of aneurysm or dissection. No significant stenosis of the major arch vessel origins. Mild aortic calcific atherosclerosis. Mixed plaque of left subclavian origin with mild less than 50% stenosis. Right carotid system: No evidence of dissection, stenosis (50% or greater) or occlusion. Calcified plaque of the carotid bifurcation with mild less  than 50% proximal ICA stenosis. Left carotid system: No evidence of dissection, stenosis (50% or greater) or occlusion. Non stenotic calcified plaque of the carotid bifurcation. Vertebral arteries: Bilateral vertebral artery origin dense calcified plaque with mild-to-moderate stenosis, accurate assessment of luminal stenosis is suboptimal due to dense calcification. Left dominant. No dissection or aneurysm. Skeleton: No acute finding. Mild discogenic degenerative changes at the C5-6 level. Other neck: Negative. Upper chest: Negative. Review of the MIP images confirms the above findings CTA HEAD FINDINGS Anterior circulation: No significant stenosis, proximal occlusion, aneurysm, or vascular malformation. Non stenotic calcific atherosclerosis of carotid siphons. Posterior circulation: No significant stenosis, proximal occlusion, aneurysm, or vascular malformation. Venous sinuses: As permitted by contrast timing, patent. Anatomic variants: Anterior communicating artery and right posterior communicating arteries. Possible diminutive left posterior communicating artery. Delayed phase: No abnormal intracranial enhancement. Review of the MIP images confirms the above findings IMPRESSION: 1. Patent carotid and vertebral arteries. No dissection, aneurysm, or hemodynamically significant stenosis utilizing NASCET criteria. 2. Patent anterior and posterior intracranial circulation. No large vessel occlusion, aneurysm, or significant stenosis. 3. Calcific atherosclerosis of aorta, carotid bifurcations, and carotid siphons. Electronically Signed   By: Edgardo Roys.D.  On: 04/08/2018 19:13   Dg Chest 2 View  Result Date: 04/08/2018 CLINICAL DATA:  Pt c/o face numbness and right arm tingling x 1 day; pt is admitted for a TIA. Hx of HTN. Pt is a nonsmoker. EXAM: CHEST - 2 VIEW COMPARISON:  None. FINDINGS: The heart size and mediastinal contours are within normal limits. Both lungs are clear. The visualized  skeletal structures are unremarkable. IMPRESSION: No active cardiopulmonary disease. Electronically Signed   By: Nolon Nations M.D.   On: 04/08/2018 14:56   Ct Head Wo Contrast  Result Date: 04/08/2018 CLINICAL DATA:  Right sided numbness EXAM: CT HEAD WITHOUT CONTRAST TECHNIQUE: Contiguous axial images were obtained from the base of the skull through the vertex without intravenous contrast. COMPARISON:  None. FINDINGS: Brain: The brain shows a normal appearance without evidence of malformation, atrophy, old or acute small or large vessel infarction, mass lesion, hemorrhage, hydrocephalus or extra-axial collection. Vascular: No hyperdense vessel. No evidence of atherosclerotic calcification. Skull: Normal.  No traumatic finding.  No focal bone lesion. Sinuses/Orbits: Sinuses are clear. Orbits appear normal. Mastoids are clear. Other: None significant IMPRESSION: Normal head CT Electronically Signed   By: Nelson Chimes M.D.   On: 04/08/2018 11:26   Ct Angio Neck W Or Wo Contrast  Result Date: 04/08/2018 CLINICAL DATA:  53 y/o M; right-sided numbness. TIA/stroke, initial exam. EXAM: CT ANGIOGRAPHY HEAD AND NECK TECHNIQUE: Multidetector CT imaging of the head and neck was performed using the standard protocol during bolus administration of intravenous contrast. Multiplanar CT image reconstructions and MIPs were obtained to evaluate the vascular anatomy. Carotid stenosis measurements (when applicable) are obtained utilizing NASCET criteria, using the distal internal carotid diameter as the denominator. CONTRAST:  55mL ISOVUE-370 IOPAMIDOL (ISOVUE-370) INJECTION 76% COMPARISON:  04/08/2018 CT head and MRI head. FINDINGS: CTA NECK FINDINGS Aortic arch: Standard branching. Imaged portion shows no evidence of aneurysm or dissection. No significant stenosis of the major arch vessel origins. Mild aortic calcific atherosclerosis. Mixed plaque of left subclavian origin with mild less than 50% stenosis. Right carotid  system: No evidence of dissection, stenosis (50% or greater) or occlusion. Calcified plaque of the carotid bifurcation with mild less than 50% proximal ICA stenosis. Left carotid system: No evidence of dissection, stenosis (50% or greater) or occlusion. Non stenotic calcified plaque of the carotid bifurcation. Vertebral arteries: Bilateral vertebral artery origin dense calcified plaque with mild-to-moderate stenosis, accurate assessment of luminal stenosis is suboptimal due to dense calcification. Left dominant. No dissection or aneurysm. Skeleton: No acute finding. Mild discogenic degenerative changes at the C5-6 level. Other neck: Negative. Upper chest: Negative. Review of the MIP images confirms the above findings CTA HEAD FINDINGS Anterior circulation: No significant stenosis, proximal occlusion, aneurysm, or vascular malformation. Non stenotic calcific atherosclerosis of carotid siphons. Posterior circulation: No significant stenosis, proximal occlusion, aneurysm, or vascular malformation. Venous sinuses: As permitted by contrast timing, patent. Anatomic variants: Anterior communicating artery and right posterior communicating arteries. Possible diminutive left posterior communicating artery. Delayed phase: No abnormal intracranial enhancement. Review of the MIP images confirms the above findings IMPRESSION: 1. Patent carotid and vertebral arteries. No dissection, aneurysm, or hemodynamically significant stenosis utilizing NASCET criteria. 2. Patent anterior and posterior intracranial circulation. No large vessel occlusion, aneurysm, or significant stenosis. 3. Calcific atherosclerosis of aorta, carotid bifurcations, and carotid siphons. Electronically Signed   By: Kristine Garbe M.D.   On: 04/08/2018 19:13   Mr Jeri Cos JT Contrast  Result Date: 04/08/2018 CLINICAL DATA:  53 y/o  M; right-sided numbness. EXAM: MRI HEAD WITHOUT AND WITH CONTRAST TECHNIQUE: Multiplanar, multiecho pulse sequences of  the brain and surrounding structures were obtained without and with intravenous contrast. CONTRAST:  10 cc Gadavist COMPARISON:  04/08/2018 CT head and CTA head. FINDINGS: Brain: 12 mm left lateral precentral gyrus focus of cortical reduced diffusion compatible with acute/early subacute infarction. No hemorrhage or mass effect. Focus of infarction demonstrates faintly increased T2 FLAIR hyperintense signal abnormality. No extra-axial collection, hydrocephalus, mass effect, or herniation. Small focus of left medial orbitofrontal encephalomalacia, probably sequelae of prior traumatic brain injury given distribution. No additional structural or signal abnormality of the brain identified. After administration of intravenous contrast there is no abnormal enhancement of the brain Vascular: Normal flow voids. Skull and upper cervical spine: Normal marrow signal. Sinuses/Orbits: Negative. Other: None. IMPRESSION: 1. 12 mm acute/early subacute infarction within the left lateral precentral gyrus. No hemorrhage or mass effect. 2. Small focus of left medial orbitofrontal encephalomalacia, probably sequelae of prior traumatic brain injury. 3. Otherwise unremarkable MRI of the brain. These results will be called to the ordering clinician or representative by the Radiologist Assistant, and communication documented in the PACS or zVision Dashboard. Electronically Signed   By: Kristine Garbe M.D.   On: 04/08/2018 19:21   Mr Cardiac Morphology W Wo Contrast  Result Date: 04/10/2018 CLINICAL DATA:  Stroke LV apical Thrombus EXAM: CARDIAC MRI TECHNIQUE: The patient was scanned on a 1.5 Tesla GE magnet. A dedicated cardiac coil was used. Functional imaging was done using Fiesta sequences. 2,3, and 4 chamber views were done to assess for RWMA's. Modified Simpson's rule using a short axis stack was used to calculate an ejection fraction on a dedicated work Conservation officer, nature. The patient received L cc of  Multihance. After 10 minutes inversion recovery sequences were used to assess for infiltration and scar tissue. CONTRAST:  Gadavist FINDINGS: Atria were of normal sized. Normal aortic root 3.0 cm. No ASD/PFO. No pericardial effusion Normal RV size and function Normal MV, AV and TV. LV is mildly dilated. The distal anterior wall, apex and distal septum are thinned and akinetic. There is swirling of blood at the apex but no formed thrombus on post gadolinium long TI images. Quantitative EF is 33% (EDV 99 cc ESV 66 cc SV 33 cc) Delayed enhancement images with gadavist showed no transmural scar in the left ventricular myocardium IMPRESSION: 1.  Mild LVE EF 33% distal anterior wall, apical and septal akinesis 2.  No mural apical thrombus 3.  No transmural scar seen in LV myocardium 4.  Normal RV size and function Jenkins Rouge Electronically Signed   By: Jenkins Rouge M.D.   On: 04/10/2018 07:19     I have independently reviewed the above radiologic studies and discussed with the patient   Recent Lab Findings: Lab Results  Component Value Date   WBC 8.0 04/10/2018   HGB 13.1 04/10/2018   HCT 39.8 04/10/2018   PLT 289 04/10/2018   GLUCOSE 120 (H) 04/10/2018   CHOL 215 (H) 04/09/2018   TRIG 201 (H) 04/09/2018   HDL 59 04/09/2018   LDLCALC 116 (H) 04/09/2018   ALT 26 04/08/2018   AST 28 04/08/2018   NA 141 04/10/2018   K 4.3 04/10/2018   CL 105 04/10/2018   CREATININE 1.07 04/10/2018   BUN 9 04/10/2018   CO2 25 04/10/2018   INR 0.97 04/08/2018   HGBA1C 5.7 (H) 04/09/2018   Echocardiogram: Result status: Final result  TRANSTHORACIC ECHOCARDIOGRAM  REPORT     Patient Name:   CLAUDELL WOHLER Date of Exam: 04/09/2018 Medical Rec #:  [id number]   Height:       68.0 in Accession #:    [id number]  Weight:       176.4 lb Date of Birth:  [birth year]    BSA:          1.94 m Patient Age:    27 years    BP:           123/73 mmHg Patient Gender: M           HR:           79 bpm. Exam Location:   Inpatient    Procedure: 2D Echo, Cardiac Doppler, Color Doppler and Intracardiac            Opacification Agent  Indications:    TIA 435.9 G45.9   History:        Patient has no prior history of Echocardiogram examinations.                 Risk Factors: Hypertension and Dyslipidemia.   Sonographer:    Jonelle Sidle Dance Referring Phys: [id number] ALI HIJAZI  IMPRESSIONS    1. Small, thrombus on the apical wall of the left ventricle.  2. The left ventricle has moderately reduced systolic function of 18-84%. The cavity size is normal. There is mildly increased left ventricular wall thickness. Echo evidence of impaired diastolic relaxation.  3. The right ventricle has normal systolic function. The cavity in normal in size. There is no increase in right ventricular wall thickness.  4. The mitral valve is normal in structure.  5. The aortic valve is tricuspid. There is mild thickening of the aortic valve.  6. Definity used; apical akinesis with overall moderate LV dysfunction; mild LVH; mild diastolic dysfunction; apical thrombus noted; discussed with Dr Eliseo Squires.  FINDINGS  Left Ventricle: The left ventricle has moderately reduced systolic function of 16-60%. The cavity size is normal. There is mildly increased left ventricular wall thickness. Echo evidence of impaired diastolic relaxation There is akinesis of the entire  apical left ventricular segment. Definity contrast agent was given IV to delineate the left ventricular endocardial borders. There is a small, apical left ventricular thrombus. Right Ventricle: The right ventricle has normal systolic function. The cavity in normal in size. There is no increase in right ventricular wall thickness. Left Atrium: is normal in size Right Atrium: is normal in size Interatrial Septum: No atrial level shunt detected by color flow Doppler.  Pericardium: There is no evidence of pericardial effusion. Mitral Valve: The mitral valve is normal in structure  Mitral valve regurgitation is trivial by color flow Doppler.Diastolic mitral regurgitation is absent. Tricuspid Valve: The tricuspid valve is normal in structure. Tricuspid valve regurgitation is trivial by color flow Doppler. Aortic Valve: The aortic valve is tricuspid. There is mild thickening of the aortic valve. Pulmonic Valve: The pulmonic valve is normal in structure. Pulmonic valve regurgitation is not visualized by color flow Doppler. Venous: The inferior vena cava was normal in size with greater than 50% respiratory variability.   LEFT VENTRICLE PLAX 2D (Teich) LV EF:          58.7 %   Diastology LVIDd:          4.35 cm  LV e' lateral:   4.35 cm/s LVIDs:          3.01 cm  LV E/e' lateral: 10.5 LV PW:  1.18 cm  LV e' medial:    5.87 cm/s LV IVS:         1.25 cm  LV E/e' medial:  7.8 LVOT diam:      2.20 cm LV SV:          50 ml LVOT Area:      3.80 cm  RIGHT VENTRICLE RV Basal diam:  2.75 cm RV S prime:     9.57 cm/s TAPSE (M-mode): 1.6 cm  LEFT ATRIUM             Index       RIGHT ATRIUM           Index LA diam:        3.40 cm 1.75 cm/m  RA Pressure: 3 mmHg LA Vol (A2C):   46.5 ml 24.00 ml/m RA Area:     12.40 cm LA Vol (A4C):   39.9 ml 20.59 ml/m RA Volume:   24.90 ml  12.85 ml/m LA Biplane Vol: 44.5 ml 22.97 ml/m  AORTIC VALVE LVOT Vmax:   65.00 cm/s LVOT Vmean:  47.800 cm/s LVOT VTI:    0.129 m   AORTA Ao Root diam: 3.50 cm Ao Asc diam:  3.20 cm  MITRAL VALVE MV Area (PHT): 2.95 cm MV PHT:        74.53 msec MV Decel Time: 257 msec MV E velocity: 45.60 cm/s MV A velocity: 58.70 cm/s MV E/A ratio:  0.78    Kirk Ruths MD Electronically signed by Kirk Ruths MD Signature Date/Time: 04/09/2018/2:12:42 PM       Final       Assessment / Plan:  Severe CAD , he may have more sx than he realized as has chest pain and palpitations . LV thrombus on echo . Recent CVA with full resolution of symptoms Received one dose of  plavix HTN Hyperlipidemia  Plan : CABG   I  spent 80 minutes counseling the patient face to face.   Tannor Giovanni, PA-C 04/10/2018 11:06 AM  Pager [phone]

## 2018-04-10 NOTE — Telephone Encounter (Signed)
Pt was seen today by Dr. Johnsie Cancel in the hospital for cath today. Dr. Johnsie Cancel is listed as Primary Cardiologist. I will rpute this message to Dr. Kyla Balzarine nurse to d/w Dr. Johnsie Cancel about medication for anxiety.

## 2018-04-10 NOTE — H&P (View-Only) (Signed)
Progress Note  Patient Name: Keith Black Date of Encounter: 04/10/2018  Primary Cardiologist: Jenkins Rouge, MD    Subjective   Nervous seen in cath lab no chest pain or recurrent stroke symptoms  Inpatient Medications    Scheduled Meds: . [MAR Hold] aspirin EC  81 mg Oral Daily  . [MAR Hold] clopidogrel  75 mg Oral Daily  . [MAR Hold] losartan  50 mg Oral Daily  . [MAR Hold] metoprolol tartrate  50 mg Oral BID  . [MAR Hold] mometasone-formoterol  2 puff Inhalation BID  . [MAR Hold] mupirocin ointment  1 application Nasal BID  . [MAR Hold] pantoprazole  40 mg Oral Daily  . [MAR Hold] simvastatin  40 mg Oral q1800  . [MAR Hold] sodium chloride flush  3 mL Intravenous Once  . sodium chloride flush  3 mL Intravenous Q12H   Continuous Infusions: . sodium chloride    . sodium chloride    . heparin Stopped (04/09/18 1804)   PRN Meds: sodium chloride, [MAR Hold] acetaminophen **OR** [MAR Hold] acetaminophen (TYLENOL) oral liquid 160 mg/5 mL **OR** [MAR Hold] acetaminophen, Heparin (Porcine) in NaCl, [MAR Hold] LORazepam, [MAR Hold] senna-docusate, sodium chloride flush   Vital Signs    Vitals:   04/09/18 2330 04/10/18 0000 04/10/18 0612 04/10/18 0725  BP: 109/74     Pulse: 87     Resp: 16 19    Temp: 98 F (36.7 C)     TempSrc: Oral     SpO2: 96% 99%  100%  Weight:   79.3 kg   Height:        Intake/Output Summary (Last 24 hours) at 04/10/2018 0729 Last data filed at 04/10/2018 3295 Gross per 24 hour  Intake 54.43 ml  Output -  Net 54.43 ml   Last 3 Weights 04/10/2018 04/09/2018 12/16/2017  Weight (lbs) 174 lb 13.2 oz 176 lb 5.9 oz 177 lb 6.4 oz  Weight (kg) 79.3 kg 80 kg 80.468 kg      Telemetry    NSR no PaF  - Personally Reviewed  ECG    NSR poor R wave progression - Personally Reviewed  Physical Exam   GEN: No acute distress.   Neck: No JVD Cardiac: RRR, no murmurs, rubs, or gallops.  Respiratory: Clear to auscultation bilaterally. GI: Soft, nontender,  non-distended  MS: No edema; No deformity. Neuro:  Nonfocal  Psych: Normal affect   Labs    Chemistry Recent Labs  Lab 04/08/18 1024 04/10/18 0516  NA 137 141  K 4.6 4.3  CL 102 105  CO2 24 25  GLUCOSE 114* 120*  BUN 14 9  CREATININE 1.02 1.07  CALCIUM 9.2 9.2  PROT 7.9  --   ALBUMIN 4.5  --   AST 28  --   ALT 26  --   ALKPHOS 50  --   BILITOT 0.8  --   GFRNONAA >60 >60  GFRAA >60 >60  ANIONGAP 11 11     Hematology Recent Labs  Lab 04/08/18 1024 04/10/18 0516  WBC 6.4 8.0  RBC 4.97 4.45  HGB 14.7 13.1  HCT 44.5 39.8  MCV 89.5 89.4  MCH 29.6 29.4  MCHC 33.0 32.9  RDW 11.9 12.0  PLT 334 289    Cardiac EnzymesNo results for input(s): TROPONINI in the last 168 hours.  Recent Labs  Lab 04/08/18 1020  TROPIPOC 0.02     BNPNo results for input(s): BNP, PROBNP in the last 168 hours.   DDimer No  results for input(s): DDIMER in the last 168 hours.   Radiology    Ct Angio Head W Or Wo Contrast  Result Date: 04/08/2018 CLINICAL DATA:  53 y/o M; right-sided numbness. TIA/stroke, initial exam. EXAM: CT ANGIOGRAPHY HEAD AND NECK TECHNIQUE: Multidetector CT imaging of the head and neck was performed using the standard protocol during bolus administration of intravenous contrast. Multiplanar CT image reconstructions and MIPs were obtained to evaluate the vascular anatomy. Carotid stenosis measurements (when applicable) are obtained utilizing NASCET criteria, using the distal internal carotid diameter as the denominator. CONTRAST:  69mL ISOVUE-370 IOPAMIDOL (ISOVUE-370) INJECTION 76% COMPARISON:  04/08/2018 CT head and MRI head. FINDINGS: CTA NECK FINDINGS Aortic arch: Standard branching. Imaged portion shows no evidence of aneurysm or dissection. No significant stenosis of the major arch vessel origins. Mild aortic calcific atherosclerosis. Mixed plaque of left subclavian origin with mild less than 50% stenosis. Right carotid system: No evidence of dissection, stenosis  (50% or greater) or occlusion. Calcified plaque of the carotid bifurcation with mild less than 50% proximal ICA stenosis. Left carotid system: No evidence of dissection, stenosis (50% or greater) or occlusion. Non stenotic calcified plaque of the carotid bifurcation. Vertebral arteries: Bilateral vertebral artery origin dense calcified plaque with mild-to-moderate stenosis, accurate assessment of luminal stenosis is suboptimal due to dense calcification. Left dominant. No dissection or aneurysm. Skeleton: No acute finding. Mild discogenic degenerative changes at the C5-6 level. Other neck: Negative. Upper chest: Negative. Review of the MIP images confirms the above findings CTA HEAD FINDINGS Anterior circulation: No significant stenosis, proximal occlusion, aneurysm, or vascular malformation. Non stenotic calcific atherosclerosis of carotid siphons. Posterior circulation: No significant stenosis, proximal occlusion, aneurysm, or vascular malformation. Venous sinuses: As permitted by contrast timing, patent. Anatomic variants: Anterior communicating artery and right posterior communicating arteries. Possible diminutive left posterior communicating artery. Delayed phase: No abnormal intracranial enhancement. Review of the MIP images confirms the above findings IMPRESSION: 1. Patent carotid and vertebral arteries. No dissection, aneurysm, or hemodynamically significant stenosis utilizing NASCET criteria. 2. Patent anterior and posterior intracranial circulation. No large vessel occlusion, aneurysm, or significant stenosis. 3. Calcific atherosclerosis of aorta, carotid bifurcations, and carotid siphons. Electronically Signed   By: Kristine Garbe M.D.   On: 04/08/2018 19:13   Dg Chest 2 View  Result Date: 04/08/2018 CLINICAL DATA:  Pt c/o face numbness and right arm tingling x 1 day; pt is admitted for a TIA. Hx of HTN. Pt is a nonsmoker. EXAM: CHEST - 2 VIEW COMPARISON:  None. FINDINGS: The heart size and  mediastinal contours are within normal limits. Both lungs are clear. The visualized skeletal structures are unremarkable. IMPRESSION: No active cardiopulmonary disease. Electronically Signed   By: Nolon Nations M.D.   On: 04/08/2018 14:56   Ct Head Wo Contrast  Result Date: 04/08/2018 CLINICAL DATA:  Right sided numbness EXAM: CT HEAD WITHOUT CONTRAST TECHNIQUE: Contiguous axial images were obtained from the base of the skull through the vertex without intravenous contrast. COMPARISON:  None. FINDINGS: Brain: The brain shows a normal appearance without evidence of malformation, atrophy, old or acute small or large vessel infarction, mass lesion, hemorrhage, hydrocephalus or extra-axial collection. Vascular: No hyperdense vessel. No evidence of atherosclerotic calcification. Skull: Normal.  No traumatic finding.  No focal bone lesion. Sinuses/Orbits: Sinuses are clear. Orbits appear normal. Mastoids are clear. Other: None significant IMPRESSION: Normal head CT Electronically Signed   By: Nelson Chimes M.D.   On: 04/08/2018 11:26   Ct Angio Neck W Or  Wo Contrast  Result Date: 04/08/2018 CLINICAL DATA:  53 y/o M; right-sided numbness. TIA/stroke, initial exam. EXAM: CT ANGIOGRAPHY HEAD AND NECK TECHNIQUE: Multidetector CT imaging of the head and neck was performed using the standard protocol during bolus administration of intravenous contrast. Multiplanar CT image reconstructions and MIPs were obtained to evaluate the vascular anatomy. Carotid stenosis measurements (when applicable) are obtained utilizing NASCET criteria, using the distal internal carotid diameter as the denominator. CONTRAST:  50mL ISOVUE-370 IOPAMIDOL (ISOVUE-370) INJECTION 76% COMPARISON:  04/08/2018 CT head and MRI head. FINDINGS: CTA NECK FINDINGS Aortic arch: Standard branching. Imaged portion shows no evidence of aneurysm or dissection. No significant stenosis of the major arch vessel origins. Mild aortic calcific atherosclerosis. Mixed  plaque of left subclavian origin with mild less than 50% stenosis. Right carotid system: No evidence of dissection, stenosis (50% or greater) or occlusion. Calcified plaque of the carotid bifurcation with mild less than 50% proximal ICA stenosis. Left carotid system: No evidence of dissection, stenosis (50% or greater) or occlusion. Non stenotic calcified plaque of the carotid bifurcation. Vertebral arteries: Bilateral vertebral artery origin dense calcified plaque with mild-to-moderate stenosis, accurate assessment of luminal stenosis is suboptimal due to dense calcification. Left dominant. No dissection or aneurysm. Skeleton: No acute finding. Mild discogenic degenerative changes at the C5-6 level. Other neck: Negative. Upper chest: Negative. Review of the MIP images confirms the above findings CTA HEAD FINDINGS Anterior circulation: No significant stenosis, proximal occlusion, aneurysm, or vascular malformation. Non stenotic calcific atherosclerosis of carotid siphons. Posterior circulation: No significant stenosis, proximal occlusion, aneurysm, or vascular malformation. Venous sinuses: As permitted by contrast timing, patent. Anatomic variants: Anterior communicating artery and right posterior communicating arteries. Possible diminutive left posterior communicating artery. Delayed phase: No abnormal intracranial enhancement. Review of the MIP images confirms the above findings IMPRESSION: 1. Patent carotid and vertebral arteries. No dissection, aneurysm, or hemodynamically significant stenosis utilizing NASCET criteria. 2. Patent anterior and posterior intracranial circulation. No large vessel occlusion, aneurysm, or significant stenosis. 3. Calcific atherosclerosis of aorta, carotid bifurcations, and carotid siphons. Electronically Signed   By: Kristine Garbe M.D.   On: 04/08/2018 19:13   Mr Jeri Cos RC Contrast  Result Date: 04/08/2018 CLINICAL DATA:  53 y/o  M; right-sided numbness. EXAM: MRI HEAD  WITHOUT AND WITH CONTRAST TECHNIQUE: Multiplanar, multiecho pulse sequences of the brain and surrounding structures were obtained without and with intravenous contrast. CONTRAST:  10 cc Gadavist COMPARISON:  04/08/2018 CT head and CTA head. FINDINGS: Brain: 12 mm left lateral precentral gyrus focus of cortical reduced diffusion compatible with acute/early subacute infarction. No hemorrhage or mass effect. Focus of infarction demonstrates faintly increased T2 FLAIR hyperintense signal abnormality. No extra-axial collection, hydrocephalus, mass effect, or herniation. Small focus of left medial orbitofrontal encephalomalacia, probably sequelae of prior traumatic brain injury given distribution. No additional structural or signal abnormality of the brain identified. After administration of intravenous contrast there is no abnormal enhancement of the brain Vascular: Normal flow voids. Skull and upper cervical spine: Normal marrow signal. Sinuses/Orbits: Negative. Other: None. IMPRESSION: 1. 12 mm acute/early subacute infarction within the left lateral precentral gyrus. No hemorrhage or mass effect. 2. Small focus of left medial orbitofrontal encephalomalacia, probably sequelae of prior traumatic brain injury. 3. Otherwise unremarkable MRI of the brain. These results will be called to the ordering clinician or representative by the Radiologist Assistant, and communication documented in the PACS or zVision Dashboard. Electronically Signed   By: Kristine Garbe M.D.   On: 04/08/2018 19:21  Mr Cardiac Morphology W Wo Contrast  Result Date: 04/10/2018 CLINICAL DATA:  Stroke LV apical Thrombus EXAM: CARDIAC MRI TECHNIQUE: The patient was scanned on a 1.5 Tesla GE magnet. A dedicated cardiac coil was used. Functional imaging was done using Fiesta sequences. 2,3, and 4 chamber views were done to assess for RWMA's. Modified Simpson's rule using a short axis stack was used to calculate an ejection fraction on a  dedicated work Conservation officer, nature. The patient received L cc of Multihance. After 10 minutes inversion recovery sequences were used to assess for infiltration and scar tissue. CONTRAST:  Gadavist FINDINGS: Atria were of normal sized. Normal aortic root 3.0 cm. No ASD/PFO. No pericardial effusion Normal RV size and function Normal MV, AV and TV. LV is mildly dilated. The distal anterior wall, apex and distal septum are thinned and akinetic. There is swirling of blood at the apex but no formed thrombus on post gadolinium long TI images. Quantitative EF is 33% (EDV 99 cc ESV 66 cc SV 33 cc) Delayed enhancement images with gadavist showed no transmural scar in the left ventricular myocardium IMPRESSION: 1.  Mild LVE EF 33% distal anterior wall, apical and septal akinesis 2.  No mural apical thrombus 3.  No transmural scar seen in LV myocardium 4.  Normal RV size and function Jenkins Rouge Electronically Signed   By: Jenkins Rouge M.D.   On: 04/10/2018 07:19    Cardiac Studies   TTE EF 35% anteroapical akinesis ? LV thrombus MRI EF 33%  No LV thrombus  Patient Profile     53 y.o. male admitted with stroke and right sided weakness. TTE with RWMA and ? LV thrombus not seen by MRI   Assessment & Plan    Stroke:  Clearly no thrombus on delayed gadolinium images MRI. Surprisingly despite thinning of myocardium and akinesis no transmural scar. Was swirling of blood at apex. In light of no other etiology for stroke would still consider anticoagulation for 3 months Cath today to see if mid LAD Is occluded Then start entresto and continue beta blocker. Will ask EP to see regarding possible ILR    Time spent reviewed TTE/MRI and discussing with colleagues and Dr Gwenlyn Found who is doing cath this am 45 minutes. No LV gram during cath   For questions or updates, please contact [facility] Please consult [email or web address] for contact info under        Signed, Jenkins Rouge, MD  04/10/2018, 7:29 AM

## 2018-04-11 ENCOUNTER — Telehealth: Payer: Self-pay | Admitting: Cardiovascular Disease

## 2018-04-11 MED FILL — Verapamil HCl IV Soln 2.5 MG/ML: INTRAVENOUS | Qty: 2 | Status: AC

## 2018-04-11 NOTE — Telephone Encounter (Signed)
New Message  Patients wife is calling on his behalf. She states that her husband had a heart cath but they were not giving any discharge instructions. She is not sure when he can shower, what they need to look for if he has any problems. Please call to discuss. Was advised per Rico Junker. To send to triage.

## 2018-04-11 NOTE — Telephone Encounter (Signed)
Pts wife called today to discuss post heart cath. She states she did not get instructions for wound care when he was discharged. Pt has a groin site. I advised her that he may take off the tegaderm and gauze after 24hr. He should wash with soap and water, pat dry. She understands to call the office if pt has any s/s of bleeding/hematoma. Pt verbalized understanding and had no additional questions.

## 2018-04-12 ENCOUNTER — Telehealth: Payer: Self-pay | Admitting: Cardiology

## 2018-04-12 NOTE — Telephone Encounter (Signed)
Received outpatient telephone call from patient.  Patient is status post recent cardiac catheterization with access obtained via the right femoral artery April 10, 2018 by Dr. Gwenlyn Found.  Patient was discharged home with plans to come back for outpatient CABG.  Patient called endorsing mild groing swelling and mild ecchymosis near cath site.  Patient reports that he pulled off his bandage yesterday and it appeared normal.  This morning he noted the slight changes.  He denies any severe anterior groin discomfort.  No low back or flank pain.  He otherwise feels okay.  I discussed case with attending cardiologist this weekend, Dr. Bronson Ing  At this time, we recommend the patient continue to monitor for any changes and to restrict activity over the weekend.  Given lack of significant pain, there is no need for urgent ED evaluation for ultrasound.  The patient was advised to rest and to avoid any heavy lifting.  It was recommended that he does not lift anything heavier than a half a gallon of milk over the weekend.  He will call back if he develops any significant anterior groin, low back or flank pain, dizziness, syncope/near syncope.  Patient and his wife both verbalized understanding.  Lyda Jester, PA-C 04/12/2018

## 2018-04-12 NOTE — Telephone Encounter (Signed)
Received outpatient telephone call from the patient in follow-up to discussion from earlier today.  The patient notes that the small area of bruising has changed color over the course of the day. Has not enlarged in size, and area is soft to the touch without firmness or discomfort. However, the patient now notes that superior and medial to the access site has continued to be swollen and he has developed increasing discomfort over the course of the day despite resting and doing minimal activity. He continues to deny chest pain, shortness of breath, light headedness. Description of ecchymosis inferior to access site sounds consistent with natural evolution. However, given increasing pain and swelling at the site, concern for possible pseudoaneurysm at access site. Advised patient to present to the emergency room for further evaluation.  Bryna Colander, MD

## 2018-04-14 ENCOUNTER — Encounter: Payer: Self-pay | Admitting: Cardiovascular Disease

## 2018-04-14 NOTE — Telephone Encounter (Signed)
New message   Patient had a cardiac cath on 04/10/18 and patient is having bruising around the area and a lump. Please call to discuss/

## 2018-04-14 NOTE — Telephone Encounter (Signed)
Spoke with the patient, he stated that he has noticed some ecchymosis at the insertion site. He stated that the bruising has slowly gotten worse. He said it was about 2 inches at first but now is 6 inches. I advised that bruising is normal with this procedure and that Dr. Johnsie Cancel can look at the site at his OV tomorrow. I advised him if his bruising drastically worsens to contact the office.

## 2018-04-14 NOTE — Telephone Encounter (Signed)
Pt had recent cath. Pt has not yet been seen by our office. Has appt upcoming with Dr. Johnsie Cancel. I will route this call to Triage for further evaluation.

## 2018-04-14 NOTE — Telephone Encounter (Signed)
Duplicate encounter

## 2018-04-15 ENCOUNTER — Other Ambulatory Visit: Payer: Self-pay

## 2018-04-15 ENCOUNTER — Other Ambulatory Visit: Payer: Self-pay | Admitting: Cardiovascular Disease

## 2018-04-15 ENCOUNTER — Ambulatory Visit (INDEPENDENT_AMBULATORY_CARE_PROVIDER_SITE_OTHER): Payer: BLUE CROSS/BLUE SHIELD | Admitting: Cardiovascular Disease

## 2018-04-15 VITALS — BP 118/68 | HR 74 | Ht 68.0 in | Wt 176.0 lb

## 2018-04-15 DIAGNOSIS — I10 Essential (primary) hypertension: Secondary | ICD-10-CM

## 2018-04-15 DIAGNOSIS — S8011XA Contusion of right lower leg, initial encounter: Secondary | ICD-10-CM

## 2018-04-15 DIAGNOSIS — I639 Cerebral infarction, unspecified: Secondary | ICD-10-CM | POA: Diagnosis not present

## 2018-04-15 DIAGNOSIS — I25118 Atherosclerotic heart disease of native coronary artery with other forms of angina pectoris: Secondary | ICD-10-CM | POA: Diagnosis not present

## 2018-04-15 DIAGNOSIS — E785 Hyperlipidemia, unspecified: Secondary | ICD-10-CM

## 2018-04-15 MED ORDER — ALPRAZOLAM 0.25 MG PO TABS: 0.2500 mg | ORAL_TABLET | Freq: Two times a day (BID) | ORAL | 0 refills | Status: DC | PRN

## 2018-04-15 NOTE — Patient Instructions (Addendum)
Medication Instructions:  1-Take xanax 0.25 mg by mouth twice daily for anxiety.  If you need a refill on your cardiac medications before your next appointment, please call your pharmacy.   Lab work:  If you have labs (blood work) drawn today and your tests are completely normal, you will receive your results only by: Marland Kitchen MyChart Message (if you have MyChart) OR . A paper copy in the mail If you have any lab test that is abnormal or we need to change your treatment, we will call you to review the results.  Testing/Procedures: Your physician has requested that you have a lower  extremity arterial duplex as soon as possible. This test is an ultrasound of the arteries in the legs. It looks at arterial blood flow in the legs. Allow one hour for Lower Arterial scans. There are no restrictions or special instructions  Follow-Up: At [facility], you and your health needs are our priority.  As part of our continuing mission to provide you with exceptional heart care, we have created designated Provider Care Teams.  These Care Teams include your primary Cardiologist (physician) and Advanced Practice Providers (APPs -  Physician Assistants and Nurse Practitioners) who all work together to provide you with the care you need, when you need it. Please call to schedule your next appointment with Dr. Johnsie Cancel after your surgery.  Please call [phone] to help find a Primary Care Doctor.   Alprazolam tablets What is this medicine? ALPRAZOLAM (al PRAY zoe lam) is a benzodiazepine. It is used to treat anxiety and panic attacks. This medicine may be used for other purposes; ask your health care provider or pharmacist if you have questions. COMMON BRAND NAME(S): Xanax What should I tell my health care provider before I take this medicine? They need to know if you have any of these conditions: -an alcohol or drug abuse problem -bipolar disorder, depression, psychosis or other mental health  conditions -glaucoma -kidney or liver disease -lung or breathing disease -myasthenia gravis -Parkinson's disease -porphyria -seizures or a history of seizures -suicidal thoughts -an unusual or allergic reaction to alprazolam, other benzodiazepines, foods, dyes, or preservatives -pregnant or trying to get pregnant -breast-feeding How should I use this medicine? Take this medicine by mouth with a glass of water. Follow the directions on the prescription label. Take your medicine at regular intervals. Do not take it more often than directed. Do not stop taking except on your doctor's advice. A special MedGuide will be given to you by the pharmacist with each prescription and refill. Be sure to read this information carefully each time. Talk to your pediatrician regarding the use of this medicine in children. Special care may be needed. Overdosage: If you think you have taken too much of this medicine contact a poison control center or emergency room at once. NOTE: This medicine is only for you. Do not share this medicine with others. What if I miss a dose? If you miss a dose, take it as soon as you can. If it is almost time for your next dose, take only that dose. Do not take double or extra doses. What may interact with this medicine? Do not take this medicine with any of the following medications: -certain antiviral medicines for HIV or AIDS like delavirdine, indinavir -certain medicines for fungal infections like ketoconazole and itraconazole -narcotic medicines for cough -sodium oxybate This medicine may also interact with the following medications: -alcohol -antihistamines for allergy, cough and cold -certain antibiotics like clarithromycin, erythromycin, isoniazid,  rifampin, rifapentine, rifabutin, and troleandomycin -certain medicines for blood pressure, heart disease, irregular heart beat -certain medicines for depression, like amitriptyline, fluoxetine, sertraline -certain  medicines for seizures like carbamazepine, oxcarbazepine, phenobarbital, phenytoin, primidone -cimetidine -cyclosporine -male hormones, like estrogens or progestins and birth control pills, patches, rings, or injections -general anesthetics like halothane, isoflurane, methoxyflurane, propofol -grapefruit juice -local anesthetics like lidocaine, pramoxine, tetracaine -medicines that relax muscles for surgery -narcotic medicines for pain -other antiviral medicines for HIV or AIDS -phenothiazines like chlorpromazine, mesoridazine, prochlorperazine, thioridazine This list may not describe all possible interactions. Give your health care provider a list of all the medicines, herbs, non-prescription drugs, or dietary supplements you use. Also tell them if you smoke, drink alcohol, or use illegal drugs. Some items may interact with your medicine. What should I watch for while using this medicine? Tell your doctor or health care professional if your symptoms do not start to get better or if they get worse. Do not stop taking except on your doctor's advice. You may develop a severe reaction. Your doctor will tell you how much medicine to take. You may get drowsy or dizzy. Do not drive, use machinery, or do anything that needs mental alertness until you know how this medicine affects you. To reduce the risk of dizzy and fainting spells, do not stand or sit up quickly, especially if you are an older patient. Alcohol may increase dizziness and drowsiness. Avoid alcoholic drinks. If you are taking another medicine that also causes drowsiness, you may have more side effects. Give your health care provider a list of all medicines you use. Your doctor will tell you how much medicine to take. Do not take more medicine than directed. Call emergency for help if you have problems breathing or unusual sleepiness. What side effects may I notice from receiving this medicine? Side effects that you should report to your  doctor or health care professional as soon as possible: -allergic reactions like skin rash, itching or hives, swelling of the face, lips, or tongue -breathing problems -confusion -loss of balance or coordination -signs and symptoms of low blood pressure like dizziness; feeling faint or lightheaded, falls; unusually weak or tired -suicidal thoughts or other mood changes Side effects that usually do not require medical attention (report to your doctor or health care professional if they continue or are bothersome): -dizziness -dry mouth -nausea, vomiting -tiredness This list may not describe all possible side effects. Call your doctor for medical advice about side effects. You may report side effects to FDA at 1-800-FDA-1088. Where should I keep my medicine? Keep out of the reach of children. This medicine can be abused. Keep your medicine in a safe place to protect it from theft. Do not share this medicine with anyone. Selling or giving away this medicine is dangerous and against the law. Store at room temperature between 20 and 25 degrees C (68 and 77 degrees F). This medicine may cause accidental overdose and death if taken by other adults, children, or pets. Mix any unused medicine with a substance like cat litter or coffee grounds. Then throw the medicine away in a sealed container like a sealed bag or a coffee can with a lid. Do not use the medicine after the expiration date. NOTE: This sheet is a summary. It may not cover all possible information. If you have questions about this medicine, talk to your doctor, pharmacist, or health care provider.  2019 Elsevier/Gold Standard (2014-11-18 13:47:25)

## 2018-04-15 NOTE — Progress Notes (Signed)
Cardiology Office Note   Date:  04/15/2018   ID:  Keith Black, DOB December 18, 1965, MRN [id number]  PCP:  Patient, [name]  Cardiologist:   Keith Rouge, MD   No chief complaint on file.     History of Present Illness: Keith Black is a 53 y.o. male who presents for post hospital f/u Admitted with stroke and found to have ischemic DCM with  Presumed previous LV apical thrombus. Cath done 04/10/18 showed CTO LAD with 95% mid RCA. MRI 04/09/18 showed viable anterior wall no mural apical thrombus and EF 33% Seen by Dr Keith Black with plans for CABG. Wanted to wait 3 weeks to be on anticoagulation for presumed embolic stroke and to allow CNS to recover from stroke.   Patient has just moved from mid [place] a lot for work CRF;s include HLD and HTN. Deficits resolved including right sided weakness and left facial MRI confirmed 12 mm infarction in the left lateral precentral gyrus  Spent over 45 minutes showing him images of his TTE, cath, and MRI Discussed possible need to have AICD if EF does not improved to over 35% post revascularization.   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 CVA (cerebrovascular accident) ([place]) 04/08/2018  . GERD (gastroesophageal reflux disease)   . High cholesterol   . Hypertension   . LV (left ventricular) mural thrombus without MI   . Ocular migraine     Past Surgical History:  Procedure Laterality Date  . HERNIA REPAIR Bilateral   . LEFT HEART CATH AND CORONARY ANGIOGRAPHY N/A 04/10/2018   Procedure: LEFT HEART CATH AND CORONARY ANGIOGRAPHY;  Surgeon: Keith Harp, MD;  Location: [place] [facility];  Service: Cardiovascular;  Laterality: N/A;     Current Outpatient Medications  Medication Sig Dispense Refill  . albuterol (PROAIR HFA) 108 (90 Base) MCG/ACT inhaler  Inhale 2 puffs into the lungs every 6 (six) hours as needed for wheezing or shortness of breath.    Marland Kitchen apixaban (ELIQUIS) 5 MG TABS tablet Take 1 tablet (5 mg total) by mouth 2 (two) times daily for 30 days. 60 tablet 1  . aspirin EC 81 MG EC tablet Take 1 tablet (81 mg total) by mouth daily for 30 days. 30 tablet 0  . azelastine (ASTELIN) 0.1 % nasal spray Place 2 sprays into both nostrils 2 (two) times daily.     . budesonide-formoterol (SYMBICORT) 80-4.5 MCG/ACT inhaler Inhale 2 puffs into the lungs 2 (two) times daily. 1 Inhaler 11  . cetirizine (ZYRTEC) 10 MG tablet Take 10 mg by mouth daily.    . Cholecalciferol (CVS VIT D 5000 HIGH-POTENCY PO) Take 1 capsule by mouth daily.    . fluticasone (FLONASE) 50 MCG/ACT nasal spray Place 1 spray into both nostrils daily.    Marland Kitchen losartan (COZAAR) 50 MG tablet Take 1 tablet (50 mg total) by mouth daily for 30 days. 30 tablet 0  . metoprolol tartrate (LOPRESSOR) 50 MG tablet Take 1 tablet (50 mg total) by mouth 2 (two) times daily for 30 days. 60 tablet 0  . mupirocin ointment (BACTROBAN) 2 % Place 1 application into the nose 2 (two) times daily. 22 g 0  . pantoprazole (PROTONIX) 40 MG tablet Take 1 tablet (40 mg total) by  mouth daily. 90 tablet 1  . Probiotic Product (ALIGN PO) Take 1 capsule by mouth daily.    . simvastatin (ZOCOR) 40 MG tablet Take 1 tablet (40 mg total) by mouth daily at 6 PM for 30 days. 30 tablet 0  . UNABLE TO FIND Med Name: [employer]    . ALPRAZolam (XANAX) 0.25 MG tablet Take 1 tablet (0.25 mg total) by mouth 2 (two) times daily as needed for anxiety. 60 tablet 0   No current facility-administered medications for this visit.     Allergies:   Patient has no known allergies.    Social History:  The patient  reports that he has never smoked. His smokeless tobacco use includes snuff. He reports current alcohol use of about 25.0 standard drinks of alcohol per week.   Family History:  The patient's family history includes  Allergies in his mother; Asthma in his mother; Heart disease (age of onset: 9) in his mother; Stroke (age of onset: 89) in his father.    ROS:  Please see the history of present illness.   Otherwise, review of systems are positive for none.   All other systems are reviewed and negative.    PHYSICAL EXAM: VS:  BP 118/68   Pulse 74   Ht 5\' 8"  (1.727 m)   Wt 176 lb (79.8 kg)   BMI 26.76 kg/m  , BMI Body mass index is 26.76 kg/m. Affect appropriate Healthy:  appears stated age 33: normal Neck supple with no adenopathy JVP normal no bruits no thyromegaly Lungs clear with no wheezing and good diaphragmatic motion Heart:  S1/S2 no murmur, no rub, gallop or click PMI normal Abdomen: benighn, BS positve, no tenderness, no AAA no bruit.  No HSM or HJR Distal pulses intact with no bruits No edema Neuro non-focal Skin warm and dry No muscular weakness Cath sight with no bruit but hematoma with echymosis on inside of leg     EKG:  ST rate 119 poor R wave progression    Recent Labs: 04/08/2018: ALT 26 04/10/2018: BUN 9; Creatinine, Ser 1.07; Hemoglobin 13.1; Platelets 289; Potassium 4.3; Sodium 141    Lipid Panel    Component Value Date/Time   CHOL 215 (H) 04/09/2018 0523   TRIG 201 (H) 04/09/2018 0523   HDL 59 04/09/2018 0523   CHOLHDL 3.6 04/09/2018 0523   VLDL 40 04/09/2018 0523   LDLCALC 116 (H) 04/09/2018 0523      Wt Readings from Last 3 Encounters:  04/15/18 176 lb (79.8 kg)  04/10/18 174 lb 13.2 oz (79.3 kg)  12/16/17 177 lb 6.4 oz (80.5 kg)      Other studies Reviewed: Additional studies/ records that were reviewed today include: hospital notes MRI brain and heart TTE, cath notes Dr Keith Black labs and ECG .    ASSESSMENT AND PLAN:  1.  CAD:  95% mid RCA with CTO LAD Viable anterior wall by MRI EF 33% F/u Dr Keith Black to proceed with CABG after recovering from stroke He has a hematoma over RFA will get duplex to r/o pseudo aneurysm  2. Stroke presumed embolic  from LV apical clot 3 weeks eliquis prior to CABG MRI showed no apical thrombus remaining 3. HTN:  Well controlled.  Continue current medications and low sodium Dash type diet.   4. HLD  Continue statin labs with primary  5. GERD:  Continue protonix low carb diet 6. Anxiety reactive to recent stroke and need for CABG  Xanax .25 bid written for  Will try to get him in to see one of the [place] Primary care doctors   Current medicines are reviewed at length with the patient today.  The patient does not have concerns regarding medicines.  The following changes have been made:  no change  Labs/ tests ordered today include: none  No orders of the defined types were placed in this encounter.  Time spent with patient and wife discussing diagnosis, prognosis and upcoming CABG with exam over 45 minutes   Disposition:   FU with cardiology post CABG      Signed, Keith Rouge, MD  04/15/2018 3:16 PM    [facility] [address], [place], [place]  [address] Phone: [phone]; Fax: [phone]

## 2018-04-15 NOTE — Patient Outreach (Signed)
[facility] [facility]) Care Management  04/15/2018  Suede Greenawalt 1965/04/09 [id number]  EMMI: stroke red alert Referral date: 04/15/18 Referral reason: Feeling worse overall: yes Insurance:  [employer] cross blue shield Day # 3 Attempt #1  Telephone call to patient regarding EMMI stroke red alert. Unable to reach patient. HIPAA compliant voice message left with call back phone number.   PLAN: RNCm will attempt 2nd telephone call to patient within 4 business days.   Quinn Plowman RN,BSN,CCM [facility] Telephonic  [phone]

## 2018-04-15 NOTE — Telephone Encounter (Signed)
Patient was seen in for office visit today.

## 2018-04-16 ENCOUNTER — Ambulatory Visit (HOSPITAL_COMMUNITY)
Admission: RE | Admit: 2018-04-16 | Discharge: 2018-04-16 | Disposition: A | Discharge status: Home / Self Care | Payer: BLUE CROSS/BLUE SHIELD | Source: Ambulatory Visit | Attending: Cardiovascular Disease | Admitting: Cardiovascular Disease

## 2018-04-16 ENCOUNTER — Telehealth: Payer: Self-pay

## 2018-04-16 ENCOUNTER — Encounter (HOSPITAL_COMMUNITY): Payer: BLUE CROSS/BLUE SHIELD

## 2018-04-16 DIAGNOSIS — S8011XA Contusion of right lower leg, initial encounter: Secondary | ICD-10-CM | POA: Diagnosis not present

## 2018-04-16 NOTE — Telephone Encounter (Signed)
Message received from Dr. Johnsie Cancel- referral for NP. Please advise.

## 2018-04-17 NOTE — Telephone Encounter (Signed)
Appt scheduled 04/25/2018.

## 2018-04-17 NOTE — Telephone Encounter (Signed)
Please schedule NP appt at his convenience. Thank you.  

## 2018-04-17 NOTE — Telephone Encounter (Signed)
Schedule a office visit at the patient's convenience.

## 2018-04-18 ENCOUNTER — Other Ambulatory Visit: Payer: Self-pay

## 2018-04-18 NOTE — Patient Outreach (Signed)
[place] [facility]) Care Management  04/18/2018  Gianmarco Roye Nov 05, 1965 [id number]  EMMI: stroke red alert Referral date: 04/15/18 Referral reason: Feeling worse overall: yes Insurance:  [employer] cross blue shield Day # 3 Attempt #2  Telephone call to patient regarding EMMI stroke red alert. Patient would not verify HIPAA.  Patient states he is unable to take call at this time and request call at another time.   PLAN: RNCM will attempt 3rd telephone outreach within 4 business days.   Quinn Plowman RN,BSN,CCM [facility] Telephonic  [phone]

## 2018-04-21 DIAGNOSIS — I2510 Atherosclerotic heart disease of native coronary artery without angina pectoris: Secondary | ICD-10-CM | POA: Diagnosis not present

## 2018-04-21 DIAGNOSIS — I5022 Chronic systolic (congestive) heart failure: Secondary | ICD-10-CM | POA: Diagnosis not present

## 2018-04-21 DIAGNOSIS — Z8673 Personal history of transient ischemic attack (TIA), and cerebral infarction without residual deficits: Secondary | ICD-10-CM | POA: Diagnosis not present

## 2018-04-22 ENCOUNTER — Ambulatory Visit (INDEPENDENT_AMBULATORY_CARE_PROVIDER_SITE_OTHER): Payer: BLUE CROSS/BLUE SHIELD | Admitting: Surgery

## 2018-04-22 ENCOUNTER — Other Ambulatory Visit: Payer: Self-pay

## 2018-04-22 ENCOUNTER — Encounter: Payer: Self-pay | Admitting: Surgery

## 2018-04-22 VITALS — BP 102/62 | HR 74 | Resp 20 | Ht 68.0 in

## 2018-04-22 DIAGNOSIS — I2510 Atherosclerotic heart disease of native coronary artery without angina pectoris: Secondary | ICD-10-CM | POA: Diagnosis not present

## 2018-04-22 NOTE — Patient Outreach (Signed)
[place] [facility]) Care Management  04/22/2018  Keith Twist Feijoo Jr. [birth year] [id number]  EMMI:stroke red alert Referral date:04/15/18 Referral reason:Feeling worse overall: yes Insurance: Blue cross blue shield Day #3 Attempt #3  Telephone call to patient regarding EMMI stroke red alert. Unable to reach patient. HIPAA compliant voice message left with call back phone number.    PLAN: RNCM will close due to being unable to establish contact.   Quinn Plowman RN,BSN,CCM [facility] Telephonic  [phone]

## 2018-04-22 NOTE — Progress Notes (Signed)
HPI:  The patient returns today for reevaluation in anticipation of planned coronary bypass graft surgery. He is a 53 year old gentleman who presented with a stroke with right-sided facial and right arm numbness and weakness which resolved completely. MRI showed a 12 mm acute/subacute infarct within the left lateral precentral gyrus without hemorrhage or mass effect. 2-D echo showed an EF of 35-40% with apical akinesis and suggested a small apical thrombus. Cath showed an occluded proximal LAD with filling of the distal vessel by faint left to left and right to left collaterals as well as an 80% mid RCA stenosis. The LCX was large and had no stenosis. A cardiac MR showed an EF of 33% with akinesis and thinning of the distal anterior wall, apical and septal akinesis. There was no mural thrombus present. RV function is normal.  There was no transmural scar seen in the left ventricular myocardium and therefore was felt that CABG to the LAD/RCA was indicated for myocardial preservation.  After discussion with neurology it was felt that we should wait about 1 month prior to doing coronary bypass graft surgery to allow his brain to recover from the stroke.  He was sent home on Eliquis.  He said that he has returned to work part-time.  He has not had any chest pain or pressure.  He denies any shortness of breath.  He does report being more fatigued than he had been prior to his admission.  He is also had some dizziness.  Current Outpatient Medications  Medication Sig Dispense Refill  . albuterol (PROAIR HFA) 108 (90 Base) MCG/ACT inhaler Inhale 2 puffs into the lungs every 6 (six) hours as needed for wheezing or shortness of breath.    . ALPRAZolam (XANAX) 0.25 MG tablet Take 1 tablet (0.25 mg total) by mouth 2 (two) times daily as needed for anxiety. 60 tablet 0  . apixaban (ELIQUIS) 5 MG TABS tablet Take 1 tablet (5 mg total) by mouth 2 (two) times daily for 30 days. 60 tablet 1  . aspirin EC 81 MG EC  tablet Take 1 tablet (81 mg total) by mouth daily for 30 days. 30 tablet 0  . azelastine (ASTELIN) 0.1 % nasal spray Place 2 sprays into both nostrils 2 (two) times daily.     . budesonide-formoterol (SYMBICORT) 80-4.5 MCG/ACT inhaler Inhale 2 puffs into the lungs 2 (two) times daily. 1 Inhaler 11  . cetirizine (ZYRTEC) 10 MG tablet Take 10 mg by mouth daily.    . Cholecalciferol (CVS VIT D 5000 HIGH-POTENCY PO) Take 1 capsule by mouth daily.    . fluticasone (FLONASE) 50 MCG/ACT nasal spray Place 1 spray into both nostrils daily.    Marland Kitchen losartan (COZAAR) 50 MG tablet Take 1 tablet (50 mg total) by mouth daily for 30 days. 30 tablet 0  . metoprolol tartrate (LOPRESSOR) 50 MG tablet Take 1 tablet (50 mg total) by mouth 2 (two) times daily for 30 days. 60 tablet 0  . mupirocin ointment (BACTROBAN) 2 % Place 1 application into the nose 2 (two) times daily. 22 g 0  . pantoprazole (PROTONIX) 40 MG tablet Take 1 tablet (40 mg total) by mouth daily. 90 tablet 1  . Probiotic Product (ALIGN PO) Take 1 capsule by mouth daily.    . simvastatin (ZOCOR) 40 MG tablet Take 1 tablet (40 mg total) by mouth daily at 6 PM for 30 days. 30 tablet 0  . UNABLE TO FIND Med Name: [employer]  No current facility-administered medications for this visit.      Physical Exam: BP 102/62   Pulse 74   Resp 20   Ht 5\' 8"  (1.727 m)   SpO2 98% Comment: RA  BMI 26.76 kg/m  He looks well.  Impression:  Overall he appears to be doing fairly well following his recent admission with a new stroke which resolved completely.  It was felt that this was most likely due to residual apical thrombus after prior anterior myocardial infarction and he did have a prior TIA with transient right vision loss in September 2019 which may have been due to the same thrombus.  Cardiac MRI did not show any residual thrombus at the apex.  He has been on Eliquis for the past 2 weeks and we will plan to do coronary bypass graft surgery 4 weeks after  his stroke.  I reviewed the cardiac catheterization images with him and his wife today and answered all of their questions.  I will plan to do bilateral internal mammary graft to the LAD and RCA. I discussed the operative procedure with the patient and family including alternatives, benefits and risks; including but not limited to bleeding, blood transfusion, infection, stroke, myocardial infarction, graft failure, heart block requiring a permanent pacemaker, organ dysfunction, and death.  Maryellen Pile. understands and agrees to proceed.   Plan:  He will take his last dose of Eliquis on Friday, 01/31/2019 and we will plan to do coronary bypass graft surgery on the following Thursday, May 08, 2018.  He will continue his aspirin and other medications as directed.  I spent 15 minutes performing this established patient evaluation and > 50% of this time was spent face to face counseling and coordinating the care of this patient's severe two-vessel coronary disease.    Gaye Pollack, MD [facility] [phone]

## 2018-04-23 ENCOUNTER — Encounter: Payer: Self-pay | Admitting: *Deleted

## 2018-04-23 ENCOUNTER — Other Ambulatory Visit: Payer: Self-pay | Admitting: *Deleted

## 2018-04-23 DIAGNOSIS — I2510 Atherosclerotic heart disease of native coronary artery without angina pectoris: Secondary | ICD-10-CM

## 2018-04-24 DIAGNOSIS — I2510 Atherosclerotic heart disease of native coronary artery without angina pectoris: Secondary | ICD-10-CM | POA: Diagnosis not present

## 2018-04-24 DIAGNOSIS — Z6826 Body mass index (BMI) 26.0-26.9, adult: Secondary | ICD-10-CM | POA: Diagnosis not present

## 2018-04-25 ENCOUNTER — Encounter: Payer: Self-pay | Admitting: Internal Medicine

## 2018-04-25 ENCOUNTER — Ambulatory Visit (INDEPENDENT_AMBULATORY_CARE_PROVIDER_SITE_OTHER): Payer: BLUE CROSS/BLUE SHIELD | Admitting: Internal Medicine

## 2018-04-25 VITALS — BP 126/74 | HR 71 | Temp 98.4°F | Resp 16 | Ht 68.0 in | Wt 175.1 lb

## 2018-04-25 DIAGNOSIS — I10 Essential (primary) hypertension: Secondary | ICD-10-CM | POA: Diagnosis not present

## 2018-04-25 DIAGNOSIS — C44519 Basal cell carcinoma of skin of other part of trunk: Secondary | ICD-10-CM | POA: Insufficient documentation

## 2018-04-25 DIAGNOSIS — E559 Vitamin D deficiency, unspecified: Secondary | ICD-10-CM

## 2018-04-25 DIAGNOSIS — C44529 Squamous cell carcinoma of skin of other part of trunk: Secondary | ICD-10-CM | POA: Insufficient documentation

## 2018-04-25 DIAGNOSIS — I5020 Unspecified systolic (congestive) heart failure: Secondary | ICD-10-CM

## 2018-04-25 DIAGNOSIS — I25119 Atherosclerotic heart disease of native coronary artery with unspecified angina pectoris: Secondary | ICD-10-CM | POA: Diagnosis not present

## 2018-04-25 DIAGNOSIS — I6300 Cerebral infarction due to thrombosis of unspecified precerebral artery: Secondary | ICD-10-CM

## 2018-04-25 DIAGNOSIS — K219 Gastro-esophageal reflux disease without esophagitis: Secondary | ICD-10-CM

## 2018-04-25 NOTE — Progress Notes (Signed)
Subjective:    Patient ID: Keith Black., male    DOB: November 24, 1965, 53 y.o.   MRN: [id number]  DOS:  04/25/2018 Type of visit - description: New patient, here with his wife.  Admitted to hospital 04/08/2018, discharged 2 days later. He was diagnosed with an acute stroke. Echocardiogram show possibly heart thrombus, cardiac MRI was negative. Echo showed a low EF thus had a had a cardiac catheterization was performed: multivessel disease. The decision was made that he needed a CABG. In consultation with neurology they decide to proceed few weeks later, scheduled for next month. He was recommended aspirin, Eliquis, statin, losartan and metoprolol.    Review of Systems Since he left the hospital he is feeling well. Denies chest pain, difficulty breathing or lower extremity edema No nausea or vomiting No blood in the urine or in the stools. Had a couple of very mild headaches.  Past Medical History:  Diagnosis Date  . Allergy   . CVA (cerebrovascular accident) ([place]) 04/08/2018  . GERD (gastroesophageal reflux disease)   . High cholesterol   . History of chicken pox   . Hypertension   . LV (left ventricular) mural thrombus without MI   . Ocular migraine     Past Surgical History:  Procedure Laterality Date  . HERNIA REPAIR Bilateral   . LEFT HEART CATH AND CORONARY ANGIOGRAPHY N/A 04/10/2018   Procedure: LEFT HEART CATH AND CORONARY ANGIOGRAPHY;  Surgeon: Lorretta Harp, MD;  Location: [facility];  Service: Cardiovascular;  Laterality: N/A;    Social History   Socioeconomic History  . Marital status: Married    Spouse name: Not on file  . Number of children: 0  . Years of education: Not on file  . Highest education level: Not on file  Occupational History  . 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[employer]  . Financial resource strain: Not on file  . Food insecurity:    Worry: Not on file    Inability: Not on file  .  Transportation needs:    Medical: Not on file    Non-medical: Not on file  Tobacco Use  . Smoking status: Never Smoker  . Smokeless tobacco: Current User    Types: Snuff  Substance and Sexual Activity  . Alcohol use: Yes    Alcohol/week: 25.0 standard drinks    Types: 25 Glasses of wine per week  . Drug use: Not on file  . Sexual activity: Not on file  Lifestyle  . Physical activity:    Days per week: Not on file    Minutes per session: Not on file  . Stress: Not on file  Relationships  . Social connections:    Talks on phone: Not on file    Gets together: Not on file    Attends religious service: Not on file    Active member of club or organization: Not on file    Attends meetings of clubs or organizations: Not on file    Relationship status: Not on file  . Intimate partner violence:    Fear of current or ex partner: Not on file    Emotionally abused: Not on file    Physically abused: Not on file    Forced sexual activity: Not on file  Other Topics Concern  . Not on file  Social History Narrative   Lives w/ wife in [place], moved to  [place] 2019     Family History  Problem Relation Age of Onset  . Allergies Mother   . Asthma Mother   . Heart disease Mother 47       had stents and then CABG  . Stroke Father 7  . Colon cancer Neg Hx   . Prostate cancer Neg Hx      Allergies as of 04/25/2018   No Known Allergies     Medication List       Accurate as of April 25, 2018 11:59 PM. Always use your most recent med list.        ALIGN PO Take 1 capsule by mouth daily.   ALPRAZolam 0.25 MG tablet Commonly known as:  XANAX Take 1 tablet (0.25 mg total) by mouth 2 (two) times daily as needed for anxiety.   apixaban 5 MG Tabs tablet Commonly known as:  ELIQUIS Take 1 tablet (5 mg total) by mouth 2 (two) times daily for 30 days.   aspirin 81 MG EC tablet Take 1 tablet (81 mg total) by mouth daily for 30 days.   azelastine 0.1 % nasal  spray Commonly known as:  ASTELIN Place 2 sprays into both nostrils 2 (two) times daily.   budesonide-formoterol 80-4.5 MCG/ACT inhaler Commonly known as:  SYMBICORT Inhale 2 puffs into the lungs 2 (two) times daily.   cetirizine 10 MG tablet Commonly known as:  ZYRTEC Take 10 mg by mouth daily.   [employer] VIT D 5000 HIGH-POTENCY PO Take 1 capsule by mouth daily.   esomeprazole 20 MG capsule Commonly known as:  NEXIUM Take 20 mg by mouth daily at 12 noon.   fluticasone 50 MCG/ACT nasal spray Commonly known as:  FLONASE Place 1 spray into both nostrils daily.   losartan 50 MG tablet Commonly known as:  COZAAR Take 1 tablet (50 mg total) by mouth daily for 30 days.   metoprolol tartrate 50 MG tablet Commonly known as:  LOPRESSOR Take 1 tablet (50 mg total) by mouth 2 (two) times daily for 30 days.   mupirocin ointment 2 % Commonly known as:  BACTROBAN Place 1 application into the nose 2 (two) times daily.   pantoprazole 40 MG tablet Commonly known as:  PROTONIX Take 1 tablet (40 mg total) by mouth daily.   PROAIR HFA 108 (90 Base) MCG/ACT inhaler Generic drug:  albuterol Inhale 2 puffs into the lungs every 6 (six) hours as needed for wheezing or shortness of breath.   simvastatin 40 MG tablet Commonly known as:  ZOCOR Take 1 tablet (40 mg total) by mouth daily at 6 PM for 30 days.   UNABLE TO FIND Med Name: Bio Kult           Objective:   Physical Exam BP 126/74 (BP Location: Right Arm, Patient Position: Sitting, Cuff Size: Normal)   Pulse 71   Temp 98.4 F (36.9 C) (Oral)   Resp 16   Ht 5' 8"  (1.727 m)   Wt 175 lb 2 oz (79.4 kg)   SpO2 98%   BMI 26.63 kg/m   General:   Well developed, NAD, BMI noted.  HEENT:  Normocephalic . Face symmetric, atraumatic Neck: No thyromegaly Lungs:  CTA B Normal respiratory effort, no intercostal retractions, no accessory muscle use. Heart: RRR,  no murmur.  no pretibial edema bilaterally  Abdomen:  Not distended,  soft, non-tender. No rebound or rigidity.   Skin: Not pale. Not jaundice Neurologic:  alert & oriented X3.  Speech normal, gait appropriate for age and unassisted Psych--  Cognition  and judgment appear intact.  Cooperative with normal attention span and concentration.  Behavior appropriate. No anxious or depressed appearing.     Assessment    Assessment (new patient, referred by Dr. Johnsie Cancel) HTN  GERD Hyperlipidemia Asthma? Allergies?  CV: -CVA acute 04-2018 -CAD, DX 04-2018 scheduled for CABG 05-2018 -Heart failure, DX 04-2018 in the context of a stroke, EF 30%. Vitamin D deficiency BCC-SCC ?Ocular migraine x 1 11/2017 Moved to [place]  2019.   PLAN HTN: Currently on losartan, metoprolol.  BP seems to be well controlled. CHF, CAD: Recently diagnosed, he already met with a surgeon, he is for elective CABG 05-2018.  Check a CMP, CBC, TSH CVA: Acute CVA earlier this month, seems to be doing well, currently on aspirin, Eliquis without apparent problems. Hyperlipidemia: Last FLP recently at the hospital was okay but simvastatin was increased to 40 mg daily. Asthma?  Has inhalers, uses them as needed GERD: On Protonix daily, occasionally  takes Nexium at night. Mild hyperglycemia: Last A1c 5.7, recommend appropriate diet.  Request to see dietitian, information provided. Tobacco use: Chewing, counseled. Vitamin D deficiency, on supplements, checking levels. Preventive care: Tdap 2016, colonoscopy 2018 per pt .  Had a flu shot RTC 3 months   Today, I spent more than  35  min with the patient and his wife: >50% of the time counseling regards all he is recent medical issues, doing extensive chart review.  Multiple questions answered to the best of my ability.

## 2018-04-25 NOTE — Progress Notes (Signed)
Pre visit review using our clinic review tool, if applicable. No additional management support is needed unless otherwise documented below in the visit note. 

## 2018-04-25 NOTE — Patient Instructions (Signed)
GO TO THE LAB : Get the blood work     GO TO THE FRONT DESK Schedule your next appointment for a checkup in 3 months  Local dermatologist: Dr. Nevada Crane [facility]    Check the  blood pressure  weekly Be sure your blood pressure is between 110/65 and  135/85. If it is consistently higher or lower, let me know

## 2018-04-26 LAB — CBC WITH DIFFERENTIAL/PLATELET
Absolute Monocytes: 611 cells/uL (ref 200–950)
BASOS ABS: 114 {cells}/uL (ref 0–200)
Basophils Relative: 1.6 %
Eosinophils Absolute: 199 cells/uL (ref 15–500)
Eosinophils Relative: 2.8 %
HCT: 40 % (ref 38.5–50.0)
Hemoglobin: 14.1 g/dL (ref 13.2–17.1)
Lymphs Abs: 2435 cells/uL (ref 850–3900)
MCH: 31.1 pg (ref 27.0–33.0)
MCHC: 35.3 g/dL (ref 32.0–36.0)
MCV: 88.1 fL (ref 80.0–100.0)
MPV: 9.7 fL (ref 7.5–12.5)
Monocytes Relative: 8.6 %
Neutro Abs: 3742 cells/uL (ref 1500–7800)
Neutrophils Relative %: 52.7 %
PLATELETS: 408 10*3/uL — AB (ref 140–400)
RBC: 4.54 10*6/uL (ref 4.20–5.80)
RDW: 11.8 % (ref 11.0–15.0)
TOTAL LYMPHOCYTE: 34.3 %
WBC: 7.1 10*3/uL (ref 3.8–10.8)

## 2018-04-26 LAB — COMPREHENSIVE METABOLIC PANEL
AG Ratio: 1.8 (calc) (ref 1.0–2.5)
ALT: 32 U/L (ref 9–46)
AST: 23 U/L (ref 10–35)
Albumin: 4.4 g/dL (ref 3.6–5.1)
Alkaline phosphatase (APISO): 67 U/L (ref 35–144)
BUN: 13 mg/dL (ref 7–25)
CO2: 28 mmol/L (ref 20–32)
Calcium: 9.8 mg/dL (ref 8.6–10.3)
Chloride: 101 mmol/L (ref 98–110)
Creat: 1.08 mg/dL (ref 0.70–1.33)
Globulin: 2.5 g/dL (calc) (ref 1.9–3.7)
Glucose, Bld: 69 mg/dL (ref 65–99)
Potassium: 4.5 mmol/L (ref 3.5–5.3)
Sodium: 140 mmol/L (ref 135–146)
Total Bilirubin: 0.7 mg/dL (ref 0.2–1.2)
Total Protein: 6.9 g/dL (ref 6.1–8.1)

## 2018-04-26 LAB — TSH: TSH: 1.05 mIU/L (ref 0.40–4.50)

## 2018-04-26 LAB — VITAMIN D 25 HYDROXY (VIT D DEFICIENCY, FRACTURES): Vit D, 25-Hydroxy: 55 ng/mL (ref 30–100)

## 2018-04-27 DIAGNOSIS — Z09 Encounter for follow-up examination after completed treatment for conditions other than malignant neoplasm: Secondary | ICD-10-CM | POA: Insufficient documentation

## 2018-04-27 NOTE — Assessment & Plan Note (Signed)
HTN: Currently on losartan, metoprolol.  BP seems to be well controlled. CHF, CAD: Recently diagnosed, he already met with a surgeon, he is for elective CABG 05-2018.  Check a CMP, CBC, TSH CVA: Acute CVA earlier this month, seems to be doing well, currently on aspirin, Eliquis without apparent problems. Hyperlipidemia: Last FLP recently at the hospital was okay but simvastatin was increased to 40 mg daily. Asthma?  Has inhalers, uses them as needed GERD: On Protonix daily, occasionally  takes Nexium at night. Mild hyperglycemia: Last A1c 5.7, recommend appropriate diet.  Request to see dietitian, information provided. Tobacco use: Chewing, counseled. Vitamin D deficiency, on supplements, checking levels. Preventive care: Tdap 2016, colonoscopy 2018 per pt .  Had a flu shot RTC 3 months

## 2018-04-28 ENCOUNTER — Telehealth: Payer: Self-pay | Admitting: Cardiovascular Disease

## 2018-04-28 NOTE — Telephone Encounter (Signed)
Patient aware of results. Per Dr. Johnsie Cancel, Artery has healed well after cath bruising may take 3 weeks to go away especially on blood thinner. Patient verbalized understanding.

## 2018-04-28 NOTE — Telephone Encounter (Signed)
  Patient is returning a call from last week possibly from [facility]

## 2018-04-29 ENCOUNTER — Other Ambulatory Visit: Payer: Self-pay | Admitting: *Deleted

## 2018-04-30 ENCOUNTER — Emergency Department (HOSPITAL_COMMUNITY): Payer: BLUE CROSS/BLUE SHIELD

## 2018-04-30 ENCOUNTER — Encounter (HOSPITAL_COMMUNITY): Payer: Self-pay | Admitting: Emergency Medicine

## 2018-04-30 ENCOUNTER — Emergency Department (HOSPITAL_COMMUNITY)
Admission: EM | Admit: 2018-04-30 | Discharge: 2018-04-30 | Disposition: A | Discharge status: Home / Self Care | Payer: BLUE CROSS/BLUE SHIELD | Attending: Emergency Medicine | Admitting: Emergency Medicine

## 2018-04-30 DIAGNOSIS — C44519 Basal cell carcinoma of skin of other part of trunk: Secondary | ICD-10-CM | POA: Diagnosis not present

## 2018-04-30 DIAGNOSIS — R2990 Unspecified symptoms and signs involving the nervous system: Secondary | ICD-10-CM | POA: Diagnosis not present

## 2018-04-30 DIAGNOSIS — F17200 Nicotine dependence, unspecified, uncomplicated: Secondary | ICD-10-CM | POA: Insufficient documentation

## 2018-04-30 DIAGNOSIS — I10 Essential (primary) hypertension: Secondary | ICD-10-CM | POA: Diagnosis not present

## 2018-04-30 DIAGNOSIS — I6503 Occlusion and stenosis of bilateral vertebral arteries: Secondary | ICD-10-CM | POA: Diagnosis not present

## 2018-04-30 DIAGNOSIS — I639 Cerebral infarction, unspecified: Secondary | ICD-10-CM | POA: Diagnosis not present

## 2018-04-30 DIAGNOSIS — R079 Chest pain, unspecified: Secondary | ICD-10-CM | POA: Diagnosis not present

## 2018-04-30 DIAGNOSIS — R29898 Other symptoms and signs involving the musculoskeletal system: Secondary | ICD-10-CM

## 2018-04-30 DIAGNOSIS — R531 Weakness: Secondary | ICD-10-CM | POA: Diagnosis not present

## 2018-04-30 DIAGNOSIS — R202 Paresthesia of skin: Secondary | ICD-10-CM | POA: Insufficient documentation

## 2018-04-30 DIAGNOSIS — Z7901 Long term (current) use of anticoagulants: Secondary | ICD-10-CM | POA: Diagnosis not present

## 2018-04-30 DIAGNOSIS — R42 Dizziness and giddiness: Secondary | ICD-10-CM | POA: Insufficient documentation

## 2018-04-30 DIAGNOSIS — I6523 Occlusion and stenosis of bilateral carotid arteries: Secondary | ICD-10-CM | POA: Diagnosis not present

## 2018-04-30 DIAGNOSIS — R200 Anesthesia of skin: Secondary | ICD-10-CM | POA: Diagnosis not present

## 2018-04-30 DIAGNOSIS — Z79899 Other long term (current) drug therapy: Secondary | ICD-10-CM | POA: Diagnosis not present

## 2018-04-30 LAB — URINALYSIS, ROUTINE W REFLEX MICROSCOPIC
Bilirubin Urine: NEGATIVE
Glucose, UA: NEGATIVE mg/dL
Hgb urine dipstick: NEGATIVE
Ketones, ur: NEGATIVE mg/dL
Leukocytes,Ua: NEGATIVE
Nitrite: NEGATIVE
Protein, ur: NEGATIVE mg/dL
pH: 6.5 (ref 5.0–8.0)

## 2018-04-30 LAB — COMPREHENSIVE METABOLIC PANEL
ALT: 59 U/L — ABNORMAL HIGH (ref 0–44)
AST: 37 U/L (ref 15–41)
Albumin: 4.7 g/dL (ref 3.5–5.0)
Alkaline Phosphatase: 75 U/L (ref 38–126)
Anion gap: 13 (ref 5–15)
BUN: 16 mg/dL (ref 6–20)
CO2: 23 mmol/L (ref 22–32)
Calcium: 9.9 mg/dL (ref 8.9–10.3)
Chloride: 98 mmol/L (ref 98–111)
Creatinine, Ser: 1.12 mg/dL (ref 0.61–1.24)
GFR calc Af Amer: >60 mL/min (ref >60)
GFR calc non Af Amer: >60 mL/min (ref >60)
Glucose, Bld: 124 mg/dL — ABNORMAL HIGH (ref 70–99)
Potassium: 4 mmol/L (ref 3.5–5.1)
Sodium: 134 mmol/L — ABNORMAL LOW (ref 135–145)
Total Bilirubin: 1.2 mg/dL (ref 0.3–1.2)
Total Protein: 8.1 g/dL (ref 6.5–8.1)

## 2018-04-30 LAB — DIFFERENTIAL
Abs Immature Granulocytes: 0.03 10*3/uL (ref 0.00–0.07)
BASOS PCT: 1 %
Basophils Absolute: 0.1 10*3/uL (ref 0.0–0.1)
Eosinophils Absolute: 0.3 10*3/uL (ref 0.0–0.5)
Eosinophils Relative: 4 %
Immature Granulocytes: 0 %
Lymphocytes Relative: 30 %
Lymphs Abs: 2.1 10*3/uL (ref 0.7–4.0)
Monocytes Absolute: 0.6 10*3/uL (ref 0.1–1.0)
Monocytes Relative: 8 %
Neutro Abs: 4 10*3/uL (ref 1.7–7.7)
Neutrophils Relative %: 57 %

## 2018-04-30 LAB — CBC
HCT: 44.1 % (ref 39.0–52.0)
HEMOGLOBIN: 14.7 g/dL (ref 13.0–17.0)
MCH: 29.5 pg (ref 26.0–34.0)
MCHC: 33.3 g/dL (ref 30.0–36.0)
MCV: 88.4 fL (ref 80.0–100.0)
Platelets: 338 10*3/uL (ref 150–400)
RBC: 4.99 MIL/uL (ref 4.22–5.81)
RDW: 11.5 % (ref 11.5–15.5)
WBC: 7.1 10*3/uL (ref 4.0–10.5)
nRBC: 0 % (ref 0.0–0.2)

## 2018-04-30 LAB — APTT: APTT: 32 s (ref 24–36)

## 2018-04-30 LAB — CBG MONITORING, ED: Glucose-Capillary: 106 mg/dL — ABNORMAL HIGH (ref 70–99)

## 2018-04-30 LAB — I-STAT CREATININE, ED: Creatinine, Ser: 1.2 mg/dL (ref 0.61–1.24)

## 2018-04-30 LAB — PROTIME-INR
INR: 1.1 (ref 0.8–1.2)
Prothrombin Time: 14.4 seconds (ref 11.4–15.2)

## 2018-04-30 LAB — I-STAT TROPONIN, ED: Troponin i, poc: 0.01 ng/mL (ref 0.00–0.08)

## 2018-04-30 MED ORDER — IOPAMIDOL (ISOVUE-370) INJECTION 76%
75.0000 mL | Freq: Once | INTRAVENOUS | Status: AC | PRN
  Administered 2018-04-30: 75 mL via INTRAVENOUS

## 2018-04-30 MED ORDER — SODIUM CHLORIDE 0.9% FLUSH: 3.0000 mL | Freq: Once | INTRAVENOUS | Status: DC

## 2018-04-30 MED ORDER — MECLIZINE HCL 25 MG PO TABS
25.0000 mg | ORAL_TABLET | Freq: Once | ORAL | Status: AC
  Administered 2018-04-30: 25 mg via ORAL
  Filled 2018-04-30: qty 1

## 2018-04-30 MED ORDER — SODIUM CHLORIDE 0.9 % IV BOLUS
500.0000 mL | Freq: Once | INTRAVENOUS | Status: AC
  Administered 2018-04-30: 500 mL via INTRAVENOUS

## 2018-04-30 NOTE — ED Notes (Signed)
[facility] called to cancel code stroke per Dr Rory Percy

## 2018-04-30 NOTE — Consult Note (Addendum)
Neurology Consultation  Reason for Consult: code stroke Referring Physician: Dr. Sedonia Small  CC: right arm numbness, unstadiness  History is obtained from: patient  HPI: Keith Black. is a 53 y.o. male past medical history of a recent left frontal embolic infarct presumably from a LV apical mural thrombus, for which she is also on anticoagulation with Eliquis-last dose last night, CHF-EF 30%, presented to the emergency room for evaluation of right arm heaviness and some unsteadiness in gait. A code stroke was activated because of the acuity of the symptoms and a recent history of stroke. He says that he woke up normal this morning around 7 AM and around 9:15 he noticed the right arm become heavy.  He says it is not as bad as it was when he presented the last time and was found to have a left frontal area of restricted diffusion on his brain MRI but it is similar and concerning enough to bring him back to the emergency room. He scheduled for triple bypass in a few weeks for his coronary artery disease Denies any chest pain shortness of breath.  Denies palpitations.  Denies nausea vomiting abdominal pain.  Denies bleeding or bruising.  Denies preceding illnesses sicknesses fevers chills flulike symptoms.  LKW: 0915 04/30/2018 tpa given?: no, Eliquis last dose last night, NIHSS 0 Premorbid modified Rankin scale (mRS): 0  ROS: ROS was performed and is negative except as noted in the HPI.   Past Medical History:  Diagnosis Date  . Allergy   . CVA (cerebrovascular accident) ([place]) 04/08/2018  . GERD (gastroesophageal reflux disease)   . High cholesterol   . History of chicken pox   . Hypertension   . LV (left ventricular) mural thrombus without MI   . Ocular migraine     Family History  Problem Relation Age of Onset  . Allergies Mother   . Asthma Mother   . Heart disease Mother 43       had stents and then CABG  . Stroke Father 13  . Colon cancer Neg Hx   . Prostate cancer Neg Hx     Social History:   reports that he has never smoked. His smokeless tobacco use includes snuff. He reports current alcohol use of about 25.0 standard drinks of alcohol per week. No history on file for drug.  Medications  Current Facility-Administered Medications:  .  sodium chloride flush (NS) 0.9 % injection 3 mL, 3 mL, Intravenous, Once, Bero, Barth Kirks, MD  Current Outpatient Medications:  .  albuterol (PROAIR HFA) 108 (90 Base) MCG/ACT inhaler, Inhale 2 puffs into the lungs every 6 (six) hours as needed for wheezing or shortness of breath., Disp: , Rfl:  .  ALPRAZolam (XANAX) 0.25 MG tablet, Take 1 tablet (0.25 mg total) by mouth 2 (two) times daily as needed for anxiety. (Patient not taking: Reported on 04/25/2018), Disp: 60 tablet, Rfl: 0 .  apixaban (ELIQUIS) 5 MG TABS tablet, Take 1 tablet (5 mg total) by mouth 2 (two) times daily for 30 days., Disp: 60 tablet, Rfl: 1 .  aspirin EC 81 MG EC tablet, Take 1 tablet (81 mg total) by mouth daily for 30 days., Disp: 30 tablet, Rfl: 0 .  azelastine (ASTELIN) 0.1 % nasal spray, Place 2 sprays into both nostrils 2 (two) times daily. , Disp: , Rfl:  .  budesonide-formoterol (SYMBICORT) 80-4.5 MCG/ACT inhaler, Inhale 2 puffs into the lungs 2 (two) times daily. (Patient not taking: Reported on 04/25/2018), Disp: 1 Inhaler,  Rfl: 11 .  cetirizine (ZYRTEC) 10 MG tablet, Take 10 mg by mouth daily., Disp: , Rfl:  .  Cholecalciferol (CVS VIT D 5000 HIGH-POTENCY PO), Take 1 capsule by mouth daily., Disp: , Rfl:  .  esomeprazole (NEXIUM) 20 MG capsule, Take 20 mg by mouth daily at 12 noon., Disp: , Rfl:  .  fluticasone (FLONASE) 50 MCG/ACT nasal spray, Place 1 spray into both nostrils daily., Disp: , Rfl:  .  losartan (COZAAR) 50 MG tablet, Take 1 tablet (50 mg total) by mouth daily for 30 days., Disp: 30 tablet, Rfl: 0 .  metoprolol tartrate (LOPRESSOR) 50 MG tablet, Take 1 tablet (50 mg total) by mouth 2 (two) times daily for 30 days., Disp: 60 tablet,  Rfl: 0 .  mupirocin ointment (BACTROBAN) 2 %, Place 1 application into the nose 2 (two) times daily. (Patient not taking: Reported on 04/25/2018), Disp: 22 g, Rfl: 0 .  pantoprazole (PROTONIX) 40 MG tablet, Take 1 tablet (40 mg total) by mouth daily., Disp: 90 tablet, Rfl: 1 .  Probiotic Product (ALIGN PO), Take 1 capsule by mouth daily., Disp: , Rfl:  .  simvastatin (ZOCOR) 40 MG tablet, Take 1 tablet (40 mg total) by mouth daily at 6 PM for 30 days., Disp: 30 tablet, Rfl: 0 .  UNABLE TO FIND, Med Name: [employer], Disp: , Rfl:   Exam: Current vital signs: BP (!) 170/87 (BP Location: Left Arm)   Pulse 97   Temp 98.3 F (36.8 C) (Oral)   Resp 18   SpO2 99%  Vital signs in last 24 hours: Temp:  [98.3 F (36.8 C)] 98.3 F (36.8 C) (02/26 0949) Pulse Rate:  [97] 97 (02/26 0949) Resp:  [18] 18 (02/26 0949) BP: (170)/(87) 170/87 (02/26 0949) SpO2:  [99 %] 99 % (02/26 0949)  GENERAL: Awake, alert in NAD HEENT: - Normocephalic and atraumatic, dry mm, no LN++, no Thyromegally LUNGS - Clear to auscultation bilaterally with no wheezes CV - S1S2 RRR, no m/r/g, equal pulses bilaterally. ABDOMEN - Soft, nontender, nondistended with normoactive BS Ext: warm, well perfused, intact peripheral pulses, no edema  NEURO:  Mental Status: AA&Ox3  Language: speech is non-dysarthric.  Naming, repetition, fluency, and comprehension intact. Cranial Nerves: PERRL. EOMI, visual fields full, no facial asymmetry, facial sensation intact, hearing intact, tongue/uvula/soft palate midline, normal sternocleidomastoid and trapezius muscle strength. No evidence of tongue atrophy or fibrillations Motor: 5/5 with no drift in any of the 4 extremities Tone: is normal and bulk is normal Sensation- Intact to light touch bilaterally.  Feels a subjective heaviness in the right arm but can feel light touch and temperature normally. Coordination: FTN intact bilaterally, no ataxia in BLE. Gait-normal gait.  Is able to perform  tandem gait normally.  Is able to walk on heels and toes without difficulty.  NIHSS-1 for subjective heaviness of the right arm  Labs I have reviewed labs in epic and the results pertinent to this consultation are:  CBC    Component Value Date/Time   WBC 7.1 04/25/2018 1506   RBC 4.54 04/25/2018 1506   HGB 14.1 04/25/2018 1506   HCT 40.0 04/25/2018 1506   PLT 408 (H) 04/25/2018 1506   MCV 88.1 04/25/2018 1506   MCH 31.1 04/25/2018 1506   MCHC 35.3 04/25/2018 1506   RDW 11.8 04/25/2018 1506   LYMPHSABS 2,435 04/25/2018 1506   MONOABS 0.7 04/08/2018 1024   EOSABS 199 04/25/2018 1506   BASOSABS 114 04/25/2018 1506    CMP  Component Value Date/Time   NA 140 04/25/2018 1506   K 4.5 04/25/2018 1506   CL 101 04/25/2018 1506   CO2 28 04/25/2018 1506   GLUCOSE 69 04/25/2018 1506   BUN 13 04/25/2018 1506   CREATININE 1.20 04/30/2018 0956   CREATININE 1.08 04/25/2018 1506   CALCIUM 9.8 04/25/2018 1506   PROT 6.9 04/25/2018 1506   ALBUMIN 4.5 04/08/2018 1024   AST 23 04/25/2018 1506   ALT 32 04/25/2018 1506   ALKPHOS 50 04/08/2018 1024   BILITOT 0.7 04/25/2018 1506   GFRNONAA >60 04/10/2018 0516   GFRAA >60 04/10/2018 0516    Lipid Panel     Component Value Date/Time   CHOL 215 (H) 04/09/2018 0523   TRIG 201 (H) 04/09/2018 0523   HDL 59 04/09/2018 0523   CHOLHDL 3.6 04/09/2018 0523   VLDL 40 04/09/2018 0523   LDLCALC 116 (H) 04/09/2018 0523   LDL 160 Hemoglobin A1c-5.7 2D echocardiogram 07/23/2018 formal report pasted below IMPRESSIONS\  1. Small, thrombus on the apical wall of the left ventricle.  2. The left ventricle has moderately reduced systolic function of 02-54%. The cavity size is normal. There is mildly increased left ventricular wall thickness. Echo evidence of impaired diastolic relaxation.  3. The right ventricle has normal systolic function. The cavity in normal in size. There is no increase in right ventricular wall thickness.  4. The mitral valve  is normal in structure.  5. The aortic valve is tricuspid. There is mild thickening of the aortic valve.  6. Definity used; apical akinesis with overall moderate LV dysfunction; mild LVH; mild diastolic dysfunction; apical thrombus noted; discussed with Dr Eliseo Squires.  Imaging I have reviewed the images obtained: CT-scan of the brain done today shows no acute changes.  No bleed.  Aspects 10. CTA head and neck- no emergent LVO  Assessment: 53 year old man with past medical history of a recent cardioembolic appearing stroke in the left frontal area, CHF ejection fraction 30%, left ventricle apical mural thrombus currently on Eliquis last dose of Eliquis last night, presenting for evaluation of right arm heaviness and gait instability. NIH stroke scale-essentially 0 but I gave him a score of 1 for subjective heaviness on the right arm. Not a candidate for TPA due to Eliquis and low stroke scale. Not a candidate for EVT due to no evidence of LVO on exam or CTA.  Given the history of the LV apical thrombus, although he is on anticoagulation, needs evaluation for new strokes.  If the MRI is negative for new stroke, this is likely recrudescence of the old stroke symptoms.  Impression: Eval for new stroke versus recrudescence of old symptoms  Recommendations: MRI brain without contrast stat If negative for stroke, can be discharged home and proceed with the prior planned cardiac procedures. If there are new strokes, will need to come in and repeat echocardiogram.  No need to repeat other stroke work-up. Discussed in detail with the ED provider, Keith Goad, PA-C Will follow imaging.  Please call with questions.  -- Amie Portland, MD [facility] Pager: [phone] If 7pm to 7am, please call on call as listed on AMION.   Addendum MRI of the brain completed.  Independently reviewed.  Negative for acute infarct.  Infarct seen on the last MRI is resolving. No new infarct or  bleed. Recommendations as above Discussed with ED provider  -- Amie Portland, MD [facility] Pager: [phone] If 7pm to 7am, please call on call as listed on AMION.

## 2018-04-30 NOTE — ED Notes (Signed)
Patient transported to X-ray 

## 2018-04-30 NOTE — ED Notes (Signed)
Patient transported to MRI 

## 2018-04-30 NOTE — Discharge Instructions (Signed)
Work-up today is very reassuring, imaging shows no evidence of new stroke or expansion of your previous stroke.  I am reassured that your symptoms have resolved and your work-up otherwise looks good regarding her heart and a signs of infection.  Some of the dizziness and lightheadedness you have been experiencing intermittently may be due to the medications you have been placed on.  Follow-up with your primary care doctor regarding this and please touch base with your cardiologist regarding today's events.  Return to the emergency department for any recurrence of weakness or numbness that does not resolve, any new neurologic symptoms, chest pain, shortness of breath if you pass out or have any other new or concerning symptoms.

## 2018-04-30 NOTE — ED Triage Notes (Signed)
Pt arrives with reports of right arm heaviness that began at 915 today. When ambulating, pt reports feeling dizzy.

## 2018-04-30 NOTE — ED Notes (Signed)
Pt ambulated in hall. No dizziness/lightheadedness/sob. Steady gait.

## 2018-04-30 NOTE — Code Documentation (Signed)
53 yo male with past hx of TIA three weeks ago with known cardiac thrombus found on echo. Scheduled for a triple cardiac bypass next week. Pt is coming from home where he lives with his wife. Pt woke up at 0700 this morning at his baseline. At 0915, pt had a sudden change of right arm heaviness and some subjective unsteadiness on his feet. Wife drove him to the ED. Triage activated a Code Stroke. Stroke Team met patient in CT. Initial NIHSS 0 due to no objective neuro findings. CT Head negative for hemorrhage. CTA completed and showed no signs of LVO. Pt reports subjective right arm heaviness and dizziness while sitting. Pt was walked in CT and no unsteady gait noted by provider. Brought back to room and placed on cardiac monitor. Handoff given to Burman Nieves, Therapist, sports.

## 2018-04-30 NOTE — ED Provider Notes (Signed)
[facility] EMERGENCY DEPARTMENT Provider Note   CSN: [id number] Arrival date & time: 04/30/18  4098  An emergency department physician performed an initial assessment on this suspected stroke patient at 0935.  History   Chief Complaint Chief Complaint  Patient presents with  . Code Stroke    HPI Keith Black. is a 53 y.o. male.     Keith Black. is a 53 y.o. male with a history of recent stroke, hypertension, hyperlipidemia, coronary artery disease, LV thrombus, ocular migraines and GERD, who presents to the emergency department for evaluation of right arm heaviness.  Patient reports the sensation began at 9:15 this morning.  He reports that the right arm felt numb and heavy, he was still able to move the arm and has not had difficulty with grip but subjectively reports it feels weaker than the left.  Patient reports sensation was similar to when he presented a few weeks ago and was diagnosed with a stroke although symptoms were much less severe.  He denies any symptoms in the legs, no facial asymmetry, changes in speech or difficulty swallowing.  He denies any associated headache, no vision changes.  He does report feeling some dizziness and lightheadedness, but reports this is been an ongoing feeling since he was started on several different medications after his recently diagnosed stroke. Pt denies any chest pain or shortness of breath.  Patient reports he randomly had a fever over the weekend that resolved without treatment but otherwise has had no infectious symptoms, no cough, body aches, nasal congestion, no abdominal pain, nausea or vomiting, no urinary symptoms.  On 04/08/2018 patient was found to have asmall left precentral gyrus infarct.  He was also found to have an LV thrombus and atypical wall motion on echo, and heart cath revealed multivessel disease.  Patient is scheduled to have a CABG with [facility] cardiology soon.     Past Medical History:  Diagnosis  Date  . Allergy   . CVA (cerebrovascular accident) ([place]) 04/08/2018  . GERD (gastroesophageal reflux disease)   . High cholesterol   . History of chicken pox   . Hypertension   . LV (left ventricular) mural thrombus without MI   . Ocular migraine     Patient Active Problem List   Diagnosis Date Noted  . PCP NOTES >>>>>>>>>> 04/27/2018  . Squamous cell carcinoma of back 04/25/2018  . Basal cell carcinoma (BCC) of anterior chest 04/25/2018  . CVA (cerebral vascular accident) ([place]) 04/09/2018  . LV (left ventricular) mural thrombus without MI 04/09/2018  . Hyperlipidemia 04/09/2018  . Essential hypertension 04/09/2018  . Systolic dysfunction with heart failure ([place]) 04/09/2018  . Cough variant asthma 11/13/2017  . Vitamin D deficiency 07/02/2012  . Herpes simplex virus (HSV) infection 02/11/2012  . Esophageal reflux 03/15/2011  . Anxiety state 09/08/2009    Past Surgical History:  Procedure Laterality Date  . HERNIA REPAIR Bilateral   . LEFT HEART CATH AND CORONARY ANGIOGRAPHY N/A 04/10/2018   Procedure: LEFT HEART CATH AND CORONARY ANGIOGRAPHY;  Surgeon: Lorretta Harp, MD;  Location: [facility];  Service: Cardiovascular;  Laterality: N/A;        Home Medications    Prior to Admission medications   Medication Sig Start Date End Date Taking? Authorizing Provider  albuterol (PROAIR HFA) 108 (90 Base) MCG/ACT inhaler Inhale 2 puffs into the lungs every 6 (six) hours as needed for wheezing or shortness of breath.   Yes [provider]  ALPRAZolam (XANAX) 0.25 MG tablet Take 1 tablet (0.25 mg total) by mouth 2 (two) times daily as needed for anxiety. 04/15/18  Yes Josue Hector, MD  apixaban (ELIQUIS) 5 MG TABS tablet Take 1 tablet (5 mg total) by mouth 2 (two) times daily for 30 days. 04/10/18 05/10/18 Yes Spongberg, Audie Pinto, MD  aspirin EC 81 MG EC tablet Take 1 tablet (81 mg total) by mouth daily for 30 days. 04/11/18 05/11/18 Yes Spongberg, Audie Pinto, MD   azelastine (ASTELIN) 0.1 % nasal spray Place 2 sprays into both nostrils 2 (two) times daily as needed for allergies.    Yes [provider]  cetirizine (ZYRTEC) 10 MG tablet Take 10 mg by mouth daily.   Yes [provider]  Cholecalciferol ([employer] VIT D 5000 HIGH-POTENCY PO) Take 1 capsule by mouth daily.   Yes [provider]  esomeprazole (NEXIUM) 20 MG capsule Take 20 mg by mouth daily as needed (acid reflux).    Yes [provider]  fluticasone (FLONASE) 50 MCG/ACT nasal spray Place 1 spray into both nostrils daily as needed for allergies.    Yes [provider]  losartan (COZAAR) 50 MG tablet Take 1 tablet (50 mg total) by mouth daily for 30 days. 04/11/18 05/11/18 Yes Spongberg, Audie Pinto, MD  metoprolol tartrate (LOPRESSOR) 50 MG tablet Take 1 tablet (50 mg total) by mouth 2 (two) times daily for 30 days. 04/10/18 05/10/18 Yes Spongberg, Audie Pinto, MD  pantoprazole (PROTONIX) 40 MG tablet Take 1 tablet (40 mg total) by mouth daily. 12/16/17  Yes Tanda Rockers, MD  simvastatin (ZOCOR) 40 MG tablet Take 1 tablet (40 mg total) by mouth daily at 6 PM for 30 days. 04/10/18 05/10/18 Yes Spongberg, Audie Pinto, MD  budesonide-formoterol (SYMBICORT) 80-4.5 MCG/ACT inhaler Inhale 2 puffs into the lungs 2 (two) times daily. Patient not taking: Reported on 04/25/2018 12/16/17   Tanda Rockers, MD  mupirocin ointment (BACTROBAN) 2 % Place 1 application into the nose 2 (two) times daily. Patient not taking: Reported on 04/25/2018 04/10/18   Marcell Anger, MD    Family History Family History  Problem Relation Age of Onset  . Allergies Mother   . Asthma Mother   . Heart disease Mother 15       had stents and then CABG  . Stroke Father 42  . Colon cancer Neg Hx   . Prostate cancer Neg Hx     Social History Social History   Tobacco Use  . Smoking status: Never Smoker  . Smokeless tobacco: Current User    Types: Snuff  Substance Use Topics    . Alcohol use: Yes    Alcohol/week: 25.0 standard drinks    Types: 25 Glasses of wine per week  . Drug use: Not on file     Allergies   Patient has no known allergies.   Review of Systems Review of Systems  Constitutional: Negative for chills and fever.  HENT: Negative.   Eyes: Negative for visual disturbance.  Respiratory: Negative for cough and shortness of breath.   Cardiovascular: Negative for chest pain.  Gastrointestinal: Negative for abdominal pain, nausea and vomiting.  Genitourinary: Negative for dysuria and frequency.  Musculoskeletal: Negative for arthralgias and myalgias.  Skin: Negative for color change and rash.  Neurological: Positive for dizziness, weakness and numbness. Negative for seizures, syncope, facial asymmetry, speech difficulty, light-headedness and headaches.     Physical Exam Updated Vital Signs BP (!) 146/85  Pulse 72   Temp 98.3 F (36.8 C) (Oral)   Resp 18   Ht 5\' 8"  (1.727 m)   Wt 79.4 kg   SpO2 100%   BMI 26.63 kg/m `  Physical Exam Vitals signs and nursing note reviewed.  Constitutional:      General: He is not in acute distress.    Appearance: Normal appearance. He is well-developed and normal weight. He is not ill-appearing, toxic-appearing or diaphoretic.  HENT:     Head: Normocephalic and atraumatic.     Mouth/Throat:     Mouth: Mucous membranes are moist.     Pharynx: Oropharynx is clear.  Eyes:     General:        Right eye: No discharge.        Left eye: No discharge.     Extraocular Movements: Extraocular movements intact.     Pupils: Pupils are equal, round, and reactive to light.  Neck:     Musculoskeletal: Neck supple.  Cardiovascular:     Rate and Rhythm: Normal rate and regular rhythm.     Pulses: Normal pulses.     Heart sounds: Normal heart sounds. No murmur. No friction rub. No gallop.   Pulmonary:     Effort: Pulmonary effort is normal. No respiratory distress.     Breath sounds: Normal breath sounds.  No wheezing or rales.     Comments: Respirations equal and unlabored, patient able to speak in full sentences, lungs clear to auscultation bilaterally Abdominal:     General: Abdomen is flat. Bowel sounds are normal. There is no distension.     Palpations: Abdomen is soft. There is no mass.     Tenderness: There is no abdominal tenderness. There is no guarding.     Comments: Abdomen soft, nondistended, nontender to palpation in all quadrants without guarding or peritoneal signs  Musculoskeletal:        General: No deformity.  Skin:    General: Skin is warm and dry.     Capillary Refill: Capillary refill takes less than 2 seconds.  Neurological:     Mental Status: He is alert.     Coordination: Coordination normal.     Comments: Speech is clear, able to follow commands CN III-XII intact Normal strength in upper and lower extremities bilaterally including dorsiflexion and plantar flexion, strong and equal grip strength Sensation normal to light and sharp touch, subjective numbness in RUE reported, but no difference in sensation on exam. Moves extremities without ataxia, coordination intact Normal finger to nose and rapid alternating movements No pronator drift Normal gait, normal toe and heel walking  Psychiatric:        Mood and Affect: Mood normal.        Behavior: Behavior normal.      ED Treatments / Results  Labs (all labs ordered are listed, but only abnormal results are displayed) Labs Reviewed  COMPREHENSIVE METABOLIC PANEL - Abnormal; Notable for the following components:      Result Value   Sodium 134 (*)    Glucose, Bld 124 (*)    ALT 59 (*)    All other components within normal limits  URINALYSIS, ROUTINE W REFLEX MICROSCOPIC - Abnormal; Notable for the following components:   Specific Gravity, Urine <1.005 (*)    All other components within normal limits  CBG MONITORING, ED - Abnormal; Notable for the following components:   Glucose-Capillary 106 (*)    All  other components within normal limits  PROTIME-INR  APTT  CBC  DIFFERENTIAL  I-STAT CREATININE, ED  I-STAT TROPONIN, ED    EKG EKG Interpretation  Date/Time:  Wednesday April 30 2018 10:01:13 EST Ventricular Rate:  83 PR Interval:    QRS Duration: 81 QT Interval:  358 QTC Calculation: 421 R Axis:   42 Text Interpretation:  Sinus rhythm Confirmed by Gerlene Fee [phone]) on 04/30/2018 11:15:55 AM   Radiology Ct Angio Head W Or Wo Contrast  Result Date: 04/30/2018 CLINICAL DATA:  Right arm numbness and dizziness. EXAM: CT ANGIOGRAPHY HEAD AND NECK TECHNIQUE: Multidetector CT imaging of the head and neck was performed using the standard protocol during bolus administration of intravenous contrast. Multiplanar CT image reconstructions and MIPs were obtained to evaluate the vascular anatomy. Carotid stenosis measurements (when applicable) are obtained utilizing NASCET criteria, using the distal internal carotid diameter as the denominator. CONTRAST:  30mL ISOVUE-370 IOPAMIDOL (ISOVUE-370) INJECTION 76% COMPARISON:  04/08/2018 FINDINGS: CTA NECK FINDINGS Aortic arch: Standard 3 vessel aortic arch with mild atherosclerosis. Calcified and soft plaque in the proximal left subclavian artery resulting in less than 50% stenosis. Right carotid system: Patent with nonstenotic soft plaque in the mid to distal common carotid artery. More extensive, predominantly calcified plaque at the carotid bifurcation results in less than 50% proximal ICA narrowing, unchanged. Left carotid system: Patent with mild to moderate, predominantly calcified plaque at the carotid bifurcation without evidence of stenosis or dissection. Vertebral arteries: The vertebral arteries are patent with the left being strongly dominant. Plaque at the vertebral artery origins results in mild-to-moderate stenosis on the left and potentially moderate to severe stenosis on the right. Skeleton: Mild disc degeneration at C5-6. Other neck: No  evidence of acute abnormality or mass. Upper chest: Clear lung apices. Review of the MIP images confirms the above findings CTA HEAD FINDINGS Anterior circulation: The internal carotid arteries are patent from skull base to carotid termini with nonstenotic atherosclerotic plaque bilaterally. ACAs and MCAs are patent without evidence of proximal branch occlusion. There is a mild right M1 stenosis, and there is irregularity and mild narrowing of the right A1 segment which may be partly artifactual given the increased prominence compared to the recent prior CTA. Diffuse branch vessel irregularity is felt to predominantly be technical in nature, possibly with underlying atherosclerosis as well. No aneurysm is identified. Posterior circulation: The intracranial vertebral arteries are patent to the basilar with the left being strongly dominant. Patent left PICA, bilateral AICA, and bilateral SCA origins are visualized. The basilar artery is patent and mildly small in caliber diffusely on a developmental basis without significant focal stenosis. There are moderately large right and diminutive or absent left posterior communicating arteries. Both PCAs are patent without evidence of significant proximal stenosis on the left. The right P1 segment is hypoplastic with suggestion of a moderate stenosis proximally, however no stenosis was evident on the prior CTA and there is an increased irregular appearance of small to medium-sized intracranial arteries diffusely compared to the prior CTA which is felt to be technical in nature and therefore would favor the right P1 narrowing today to be artifactual. No aneurysm is identified. Venous sinuses: As permitted by contrast timing, patent. Anatomic variants: None. Review of the MIP images confirms the above findings IMPRESSION: 1. No emergent large vessel occlusion. 2. Intracranial atherosclerosis as above. No definite flow limiting proximal intracranial stenosis. 3. Cervical carotid  artery atherosclerosis without significant stenosis. 4. Mild-to-moderate left and moderate to severe right vertebral artery origin stenoses. 5.  Aortic Atherosclerosis (ICD10-I70.0).  These results were communicated to Dr. Rory Percy at 10:05 am on 04/30/2018 by text page via the [facility] messaging system. Electronically Signed   By: Logan Bores M.D.   On: 04/30/2018 10:29   Dg Chest 2 View  Result Date: 04/30/2018 CLINICAL DATA:  Near syncope today. Stroke x3 weeks ago, pt denies weaknesses or deficits on either side of his body. Pt denies SOB and chest pain today. Pt is scheduled for a CABG in March 2020. Hx of HTN, CVA. EXAM: CHEST - 2 VIEW COMPARISON:  04/08/2018 FINDINGS: Lungs are clear. Heart size and mediastinal contours are within normal limits. Aortic Atherosclerosis (ICD10-170.0). No effusion. Visualized bones unremarkable. IMPRESSION: No acute cardiopulmonary disease. Electronically Signed   By: Lucrezia Europe M.D.   On: 04/30/2018 12:25   Ct Angio Neck W Or Wo Contrast  Result Date: 04/30/2018 CLINICAL DATA:  Right arm numbness and dizziness. EXAM: CT ANGIOGRAPHY HEAD AND NECK TECHNIQUE: Multidetector CT imaging of the head and neck was performed using the standard protocol during bolus administration of intravenous contrast. Multiplanar CT image reconstructions and MIPs were obtained to evaluate the vascular anatomy. Carotid stenosis measurements (when applicable) are obtained utilizing NASCET criteria, using the distal internal carotid diameter as the denominator. CONTRAST:  58mL ISOVUE-370 IOPAMIDOL (ISOVUE-370) INJECTION 76% COMPARISON:  04/08/2018 FINDINGS: CTA NECK FINDINGS Aortic arch: Standard 3 vessel aortic arch with mild atherosclerosis. Calcified and soft plaque in the proximal left subclavian artery resulting in less than 50% stenosis. Right carotid system: Patent with nonstenotic soft plaque in the mid to distal common carotid artery. More extensive, predominantly calcified plaque at the  carotid bifurcation results in less than 50% proximal ICA narrowing, unchanged. Left carotid system: Patent with mild to moderate, predominantly calcified plaque at the carotid bifurcation without evidence of stenosis or dissection. Vertebral arteries: The vertebral arteries are patent with the left being strongly dominant. Plaque at the vertebral artery origins results in mild-to-moderate stenosis on the left and potentially moderate to severe stenosis on the right. Skeleton: Mild disc degeneration at C5-6. Other neck: No evidence of acute abnormality or mass. Upper chest: Clear lung apices. Review of the MIP images confirms the above findings CTA HEAD FINDINGS Anterior circulation: The internal carotid arteries are patent from skull base to carotid termini with nonstenotic atherosclerotic plaque bilaterally. ACAs and MCAs are patent without evidence of proximal branch occlusion. There is a mild right M1 stenosis, and there is irregularity and mild narrowing of the right A1 segment which may be partly artifactual given the increased prominence compared to the recent prior CTA. Diffuse branch vessel irregularity is felt to predominantly be technical in nature, possibly with underlying atherosclerosis as well. No aneurysm is identified. Posterior circulation: The intracranial vertebral arteries are patent to the basilar with the left being strongly dominant. Patent left PICA, bilateral AICA, and bilateral SCA origins are visualized. The basilar artery is patent and mildly small in caliber diffusely on a developmental basis without significant focal stenosis. There are moderately large right and diminutive or absent left posterior communicating arteries. Both PCAs are patent without evidence of significant proximal stenosis on the left. The right P1 segment is hypoplastic with suggestion of a moderate stenosis proximally, however no stenosis was evident on the prior CTA and there is an increased irregular appearance  of small to medium-sized intracranial arteries diffusely compared to the prior CTA which is felt to be technical in nature and therefore would favor the right P1 narrowing today to be artifactual. No aneurysm  is identified. Venous sinuses: As permitted by contrast timing, patent. Anatomic variants: None. Review of the MIP images confirms the above findings IMPRESSION: 1. No emergent large vessel occlusion. 2. Intracranial atherosclerosis as above. No definite flow limiting proximal intracranial stenosis. 3. Cervical carotid artery atherosclerosis without significant stenosis. 4. Mild-to-moderate left and moderate to severe right vertebral artery origin stenoses. 5.  Aortic Atherosclerosis (ICD10-I70.0). These results were communicated to Dr. Rory Percy at 10:05 am on 04/30/2018 by text page via the [facility] messaging system. Electronically Signed   By: Logan Bores M.D.   On: 04/30/2018 10:29   Mr Brain Wo Contrast  Result Date: 04/30/2018 CLINICAL DATA:  Right arm numbness.  Stroke EXAM: MRI HEAD WITHOUT CONTRAST TECHNIQUE: Multiplanar, multiecho pulse sequences of the brain and surrounding structures were obtained without intravenous contrast. COMPARISON:  CT head 04/30/2018.  MRI head 04/08/2018 FINDINGS: Brain: Negative for acute infarct. Small acute infarct on the prior MRI in the left posterior frontal lobe has resolved on diffusion. Small hyperintensity in this area on FLAIR Encephalomalacia left anterior gyrus rectus is unchanged and consistent with chronic ischemia. Ventricle size normal.  Negative for hemorrhage or mass. Vascular: Normal arterial flow voids Skull and upper cervical spine: Negative Sinuses/Orbits: Mild mucosal edema paranasal sinuses.  Normal orbit Other: None IMPRESSION: Negative for acute infarct. Chronic infarct left anterior gyrus rectus. Resolving infarct left posterior frontal lobe. Electronically Signed   By: Franchot Gallo M.D.   On: 04/30/2018 11:05   Ct Head Code Stroke Wo  Contrast  Result Date: 04/30/2018 CLINICAL DATA:  Code stroke.  Right arm numbness and dizziness. EXAM: CT HEAD WITHOUT CONTRAST TECHNIQUE: Contiguous axial images were obtained from the base of the skull through the vertex without intravenous contrast. COMPARISON:  Head CT and MRI 04/08/2018 FINDINGS: Brain: The small acute infarct laterally in the posterior left frontal lobe on the prior MRI is not well seen on CT. No acute infarct, intracranial hemorrhage, mass, midline shift, or extra-axial fluid collection is identified. A small region of encephalomalacia is again noted anteriorly in the left frontal lobe and may be related to remote trauma. The ventricles are normal in size. Vascular: Calcified atherosclerosis at the skull base. No suspicious vascular hyperdensity. Skull: No fracture or focal osseous lesion. Sinuses/Orbits: Minimal mucosal thickening in the paranasal sinuses. Clear mastoid air cells. Unremarkable orbits. Other: None. ASPECTS [facility] Stroke Program Early CT Score) - Ganglionic level infarction (caudate, lentiform nuclei, internal capsule, insula, M1-M3 cortex): 7 - Supraganglionic infarction (M4-M6 cortex): 3 Total score (0-10 with 10 being normal): 10 IMPRESSION: 1. No evidence of acute intracranial abnormality. 2. ASPECTS is 10. These results were communicated to Dr. Rory Percy at 9:55 am on 04/30/2018 by text page via the [facility] messaging system. Electronically Signed   By: Logan Bores M.D.   On: 04/30/2018 09:56    Procedures Procedures (including critical care time)  Medications Ordered in ED Medications  iopamidol (ISOVUE-370) 76 % injection 75 mL (75 mLs Intravenous Contrast Given 04/30/18 0955)  sodium chloride 0.9 % bolus 500 mL (0 mLs Intravenous Stopped 04/30/18 1304)  meclizine (ANTIVERT) tablet 25 mg (25 mg Oral Given 04/30/18 1149)     Initial Impression / Assessment and Plan / ED Course  I have reviewed the triage vital signs and the nursing notes.  Pertinent labs &  imaging results that were available during my care of the patient were reviewed by me and considered in my medical decision making (see chart for details).  Patient presented to  the emergency department complaining of right arm numbness and heaviness, I will recently diagnosed stroke 3 weeks ago, code stroke activated from triage, LKN: 9:15 AM today.  Patient denies any other neurologic deficits, no vision change, facial asymmetry, change in speech, or numbness or weakness in any other extremities.  He denies chest pain or shortness of breath.  He was cleared for CT, and neurology at bedside to evaluate patient.  CT of the head shows no evidence of bleed, or other acute abnormality.  Neurology has completed evaluation of the patient, NIH scale of 0.  Patient reporting subjective heaviness in the right arm but he has no sensory or motor deficits on exam and no other neurologic deficits found.  Dr. Rory Percy recommends CTA of the head and neck and patient will also need MRI to ensure he does not have any new acute infarcts, this could be a re-expression of symptoms from recent stroke.  Also check labs have been and assess for any infectious source to cause a re-expression of the symptoms.  EKG shows sinus rhythm without concerning changes.  Negative troponin.  Labs overall reassuring, no leukocytosis, normal hemoglobin, no acute electrolyte derangements requiring intervention, normal renal function. Normal coags.  Urinalysis without signs of infection and chest x-ray shows no active cardiopulmonary disease.  Right arm heaviness and numbness has resolved.   CTA of the head and neck show no evidence of large proximal lesion, there is cerebral vessel atherosclerosis but no new lesions.  MRI completed treatment shows resolving left precentral gyrus infarct but no new infarcts or acute changes, this is very reassuring, patient symptoms today are likely a re-expression of his known stroke.  Patient has ambulated in  the department with steady gait, all neurologic symptoms have resolved he is no longer complaining of dizziness.  Discussed MRI results with Dr. Rory Percy and at this time patient is stable for discharge home with close outpatient follow-up with neurology and cardiology, he will continue on his blood thinners and home medications.  Final Clinical Impressions(s) / ED Diagnoses   Final diagnoses:  Right arm weakness  Paresthesia    ED Discharge Orders    None       Jacqlyn Larsen, [place] 05/02/18 1153    Maudie Flakes, MD 05/02/18 [phone]

## 2018-04-30 NOTE — ED Notes (Signed)
Pt verbalized understanding of discharge paperwork and follow-up care.  °

## 2018-05-06 ENCOUNTER — Encounter (HOSPITAL_COMMUNITY): Payer: BLUE CROSS/BLUE SHIELD

## 2018-05-06 ENCOUNTER — Other Ambulatory Visit (HOSPITAL_COMMUNITY): Payer: BLUE CROSS/BLUE SHIELD

## 2018-05-07 DIAGNOSIS — L821 Other seborrheic keratosis: Secondary | ICD-10-CM | POA: Diagnosis not present

## 2018-05-07 DIAGNOSIS — L0889 Other specified local infections of the skin and subcutaneous tissue: Secondary | ICD-10-CM | POA: Diagnosis not present

## 2018-05-07 DIAGNOSIS — L400 Psoriasis vulgaris: Secondary | ICD-10-CM | POA: Diagnosis not present

## 2018-05-07 DIAGNOSIS — Z85828 Personal history of other malignant neoplasm of skin: Secondary | ICD-10-CM | POA: Diagnosis not present

## 2018-05-08 ENCOUNTER — Inpatient Hospital Stay: Admit: 2018-05-08 | Payer: BLUE CROSS/BLUE SHIELD | Admitting: Surgery

## 2018-05-08 SURGERY — CORONARY ARTERY BYPASS GRAFTING (CABG)
Anesthesia: General | Site: Chest

## 2018-05-12 DIAGNOSIS — D62 Acute posthemorrhagic anemia: Secondary | ICD-10-CM | POA: Diagnosis not present

## 2018-05-12 DIAGNOSIS — R5381 Other malaise: Secondary | ICD-10-CM | POA: Diagnosis not present

## 2018-05-12 DIAGNOSIS — I5022 Chronic systolic (congestive) heart failure: Secondary | ICD-10-CM | POA: Diagnosis not present

## 2018-05-12 DIAGNOSIS — J939 Pneumothorax, unspecified: Secondary | ICD-10-CM | POA: Diagnosis not present

## 2018-05-12 DIAGNOSIS — G8918 Other acute postprocedural pain: Secondary | ICD-10-CM | POA: Diagnosis not present

## 2018-05-12 DIAGNOSIS — Z4659 Encounter for fitting and adjustment of other gastrointestinal appliance and device: Secondary | ICD-10-CM | POA: Diagnosis not present

## 2018-05-12 DIAGNOSIS — J90 Pleural effusion, not elsewhere classified: Secondary | ICD-10-CM | POA: Diagnosis not present

## 2018-05-12 DIAGNOSIS — I160 Hypertensive urgency: Secondary | ICD-10-CM | POA: Diagnosis not present

## 2018-05-12 DIAGNOSIS — Z452 Encounter for adjustment and management of vascular access device: Secondary | ICD-10-CM | POA: Diagnosis not present

## 2018-05-12 DIAGNOSIS — J9811 Atelectasis: Secondary | ICD-10-CM | POA: Diagnosis not present

## 2018-05-12 DIAGNOSIS — R739 Hyperglycemia, unspecified: Secondary | ICD-10-CM | POA: Diagnosis not present

## 2018-05-12 DIAGNOSIS — F17290 Nicotine dependence, other tobacco product, uncomplicated: Secondary | ICD-10-CM | POA: Diagnosis not present

## 2018-05-12 DIAGNOSIS — Z9889 Other specified postprocedural states: Secondary | ICD-10-CM | POA: Diagnosis not present

## 2018-05-12 DIAGNOSIS — G4730 Sleep apnea, unspecified: Secondary | ICD-10-CM | POA: Diagnosis not present

## 2018-05-12 DIAGNOSIS — Z8673 Personal history of transient ischemic attack (TIA), and cerebral infarction without residual deficits: Secondary | ICD-10-CM | POA: Diagnosis not present

## 2018-05-12 DIAGNOSIS — D649 Anemia, unspecified: Secondary | ICD-10-CM | POA: Diagnosis not present

## 2018-05-12 DIAGNOSIS — R791 Abnormal coagulation profile: Secondary | ICD-10-CM | POA: Diagnosis not present

## 2018-05-12 DIAGNOSIS — I959 Hypotension, unspecified: Secondary | ICD-10-CM | POA: Diagnosis not present

## 2018-05-12 DIAGNOSIS — Z951 Presence of aortocoronary bypass graft: Secondary | ICD-10-CM | POA: Diagnosis not present

## 2018-05-12 DIAGNOSIS — Z4682 Encounter for fitting and adjustment of non-vascular catheter: Secondary | ICD-10-CM | POA: Diagnosis not present

## 2018-05-12 DIAGNOSIS — K219 Gastro-esophageal reflux disease without esophagitis: Secondary | ICD-10-CM | POA: Diagnosis not present

## 2018-05-12 DIAGNOSIS — E785 Hyperlipidemia, unspecified: Secondary | ICD-10-CM | POA: Diagnosis not present

## 2018-05-12 DIAGNOSIS — I110 Hypertensive heart disease with heart failure: Secondary | ICD-10-CM | POA: Diagnosis not present

## 2018-05-12 DIAGNOSIS — I2583 Coronary atherosclerosis due to lipid rich plaque: Secondary | ICD-10-CM | POA: Diagnosis not present

## 2018-05-12 DIAGNOSIS — Z9119 Patient's noncompliance with other medical treatment and regimen: Secondary | ICD-10-CM | POA: Diagnosis not present

## 2018-05-12 DIAGNOSIS — R918 Other nonspecific abnormal finding of lung field: Secondary | ICD-10-CM | POA: Diagnosis not present

## 2018-05-12 DIAGNOSIS — I2582 Chronic total occlusion of coronary artery: Secondary | ICD-10-CM | POA: Diagnosis not present

## 2018-05-12 DIAGNOSIS — I2510 Atherosclerotic heart disease of native coronary artery without angina pectoris: Secondary | ICD-10-CM | POA: Diagnosis not present

## 2018-05-13 DIAGNOSIS — D62 Acute posthemorrhagic anemia: Secondary | ICD-10-CM | POA: Diagnosis not present

## 2018-05-13 DIAGNOSIS — Z4682 Encounter for fitting and adjustment of non-vascular catheter: Secondary | ICD-10-CM | POA: Diagnosis not present

## 2018-05-13 DIAGNOSIS — Z452 Encounter for adjustment and management of vascular access device: Secondary | ICD-10-CM | POA: Diagnosis not present

## 2018-05-13 DIAGNOSIS — G8918 Other acute postprocedural pain: Secondary | ICD-10-CM | POA: Diagnosis not present

## 2018-05-13 DIAGNOSIS — I5022 Chronic systolic (congestive) heart failure: Secondary | ICD-10-CM | POA: Diagnosis not present

## 2018-05-13 DIAGNOSIS — I2510 Atherosclerotic heart disease of native coronary artery without angina pectoris: Secondary | ICD-10-CM | POA: Diagnosis not present

## 2018-05-13 DIAGNOSIS — R739 Hyperglycemia, unspecified: Secondary | ICD-10-CM | POA: Diagnosis not present

## 2018-05-13 DIAGNOSIS — I110 Hypertensive heart disease with heart failure: Secondary | ICD-10-CM | POA: Diagnosis not present

## 2018-05-13 DIAGNOSIS — Z9889 Other specified postprocedural states: Secondary | ICD-10-CM | POA: Diagnosis not present

## 2018-05-13 DIAGNOSIS — I2583 Coronary atherosclerosis due to lipid rich plaque: Secondary | ICD-10-CM | POA: Diagnosis not present

## 2018-05-13 DIAGNOSIS — Z8673 Personal history of transient ischemic attack (TIA), and cerebral infarction without residual deficits: Secondary | ICD-10-CM | POA: Diagnosis not present

## 2018-05-13 DIAGNOSIS — R918 Other nonspecific abnormal finding of lung field: Secondary | ICD-10-CM | POA: Diagnosis not present

## 2018-05-14 DIAGNOSIS — Z9889 Other specified postprocedural states: Secondary | ICD-10-CM | POA: Diagnosis not present

## 2018-05-14 DIAGNOSIS — J90 Pleural effusion, not elsewhere classified: Secondary | ICD-10-CM | POA: Diagnosis not present

## 2018-05-14 DIAGNOSIS — I2510 Atherosclerotic heart disease of native coronary artery without angina pectoris: Secondary | ICD-10-CM | POA: Diagnosis not present

## 2018-05-14 DIAGNOSIS — Z4659 Encounter for fitting and adjustment of other gastrointestinal appliance and device: Secondary | ICD-10-CM | POA: Diagnosis not present

## 2018-05-14 DIAGNOSIS — Z4682 Encounter for fitting and adjustment of non-vascular catheter: Secondary | ICD-10-CM | POA: Diagnosis not present

## 2018-05-14 DIAGNOSIS — R918 Other nonspecific abnormal finding of lung field: Secondary | ICD-10-CM | POA: Diagnosis not present

## 2018-05-14 DIAGNOSIS — Z951 Presence of aortocoronary bypass graft: Secondary | ICD-10-CM | POA: Diagnosis not present

## 2018-05-14 DIAGNOSIS — J9811 Atelectasis: Secondary | ICD-10-CM | POA: Diagnosis not present

## 2018-05-15 DIAGNOSIS — Z452 Encounter for adjustment and management of vascular access device: Secondary | ICD-10-CM | POA: Diagnosis not present

## 2018-05-15 DIAGNOSIS — Z9889 Other specified postprocedural states: Secondary | ICD-10-CM | POA: Diagnosis not present

## 2018-05-15 DIAGNOSIS — Z951 Presence of aortocoronary bypass graft: Secondary | ICD-10-CM | POA: Diagnosis not present

## 2018-05-15 DIAGNOSIS — R918 Other nonspecific abnormal finding of lung field: Secondary | ICD-10-CM | POA: Diagnosis not present

## 2018-05-16 DIAGNOSIS — J939 Pneumothorax, unspecified: Secondary | ICD-10-CM | POA: Diagnosis not present

## 2018-05-16 DIAGNOSIS — Z951 Presence of aortocoronary bypass graft: Secondary | ICD-10-CM | POA: Diagnosis not present

## 2018-05-16 DIAGNOSIS — Z4682 Encounter for fitting and adjustment of non-vascular catheter: Secondary | ICD-10-CM | POA: Diagnosis not present

## 2018-05-17 DIAGNOSIS — R918 Other nonspecific abnormal finding of lung field: Secondary | ICD-10-CM | POA: Diagnosis not present

## 2018-05-17 DIAGNOSIS — J9811 Atelectasis: Secondary | ICD-10-CM | POA: Diagnosis not present

## 2018-05-17 DIAGNOSIS — J939 Pneumothorax, unspecified: Secondary | ICD-10-CM | POA: Diagnosis not present

## 2018-05-17 DIAGNOSIS — Z9889 Other specified postprocedural states: Secondary | ICD-10-CM | POA: Diagnosis not present

## 2018-05-18 MED ORDER — SIMETHICONE 80 MG PO CHEW
80.00 | CHEWABLE_TABLET | ORAL | Status: DC
Start: ? — End: 2018-05-18

## 2018-05-18 MED ORDER — TRAMADOL HCL 50 MG PO TABS
50.00 | ORAL_TABLET | ORAL | Status: DC
Start: ? — End: 2018-05-18

## 2018-05-18 MED ORDER — SENNOSIDES-DOCUSATE SODIUM 8.6-50 MG PO TABS
2.00 | ORAL_TABLET | ORAL | Status: DC
Start: 2018-05-18 — End: 2018-05-18

## 2018-05-18 MED ORDER — ACETAMINOPHEN 325 MG PO TABS
650.00 | ORAL_TABLET | ORAL | Status: DC
Start: 2018-05-18 — End: 2018-05-18

## 2018-05-18 MED ORDER — GLUCAGON HCL RDNA (DIAGNOSTIC) 1 MG IJ SOLR
1.00 | INTRAMUSCULAR | Status: DC
Start: ? — End: 2018-05-18

## 2018-05-18 MED ORDER — PANTOPRAZOLE SODIUM 40 MG PO TBEC
40.00 | DELAYED_RELEASE_TABLET | ORAL | Status: DC
Start: 2018-05-19 — End: 2018-05-18

## 2018-05-18 MED ORDER — GABAPENTIN 100 MG PO CAPS
100.00 | ORAL_CAPSULE | ORAL | Status: DC
Start: 2018-05-18 — End: 2018-05-18

## 2018-05-18 MED ORDER — METOPROLOL TARTRATE 50 MG PO TABS
50.00 | ORAL_TABLET | ORAL | Status: DC
Start: 2018-05-18 — End: 2018-05-18

## 2018-05-18 MED ORDER — SIMVASTATIN 20 MG PO TABS
40.00 | ORAL_TABLET | ORAL | Status: DC
Start: 2018-05-18 — End: 2018-05-18

## 2018-05-18 MED ORDER — ASPIRIN 81 MG PO CHEW
81.00 | CHEWABLE_TABLET | ORAL | Status: DC
Start: 2018-05-19 — End: 2018-05-18

## 2018-05-18 MED ORDER — GUAIFENESIN ER 600 MG PO TB12
600.00 | ORAL_TABLET | ORAL | Status: DC
Start: 2018-05-18 — End: 2018-05-18

## 2018-05-18 MED ORDER — ONDANSETRON HCL 4 MG/2ML IJ SOLN
4.00 | INTRAMUSCULAR | Status: DC
Start: ? — End: 2018-05-18

## 2018-05-18 MED ORDER — DEXTROSE 50 % IV SOLN
12.50 | INTRAVENOUS | Status: DC
Start: ? — End: 2018-05-18

## 2018-05-18 MED ORDER — FUROSEMIDE 40 MG PO TABS
40.00 | ORAL_TABLET | ORAL | Status: DC
Start: 2018-05-18 — End: 2018-05-18

## 2018-05-18 MED ORDER — POLYETHYLENE GLYCOL 3350 17 G PO PACK
17.00 | PACK | ORAL | Status: DC
Start: 2018-05-19 — End: 2018-05-18

## 2018-05-18 MED ORDER — MELATONIN 3 MG PO TABS
6.00 | ORAL_TABLET | ORAL | Status: DC
Start: ? — End: 2018-05-18

## 2018-05-22 ENCOUNTER — Telehealth (HOSPITAL_COMMUNITY): Payer: Self-pay | Admitting: *Deleted

## 2018-05-22 ENCOUNTER — Ambulatory Visit (HOSPITAL_COMMUNITY): Payer: Self-pay | Admitting: *Deleted

## 2018-05-22 NOTE — Telephone Encounter (Signed)
Left message for pt to contact cardiac rehab in regards to a referral that we have received from his surgeon - Dr. Cheree Ditto at [facility].  Contact information provided. Cherre Huger, BSN Cardiac and Training and development officer

## 2018-05-22 NOTE — Progress Notes (Signed)
Received referral via fax  for pt to participate in Cardiac rehab by Dr. Magdalene Patricia).  Pt is s/p CABG x 3 on 3/10.  Noted in his medical history, pt referring MD was Dr. Riley Churches).  Reviewed medical history in Care Everywhere.  Pt hospitalization uneventful with no complications noted. Did not see follow up appt.  Office called and message left of Inquiry.  Will send MD order for Dr. Cheree Ditto and request most recent 12 lead ekg. Cherre Huger, BSN Cardiac and Training and development officer

## 2018-06-02 ENCOUNTER — Telehealth: Payer: Self-pay | Admitting: Cardiovascular Disease

## 2018-06-02 DIAGNOSIS — D62 Acute posthemorrhagic anemia: Secondary | ICD-10-CM | POA: Diagnosis not present

## 2018-06-02 DIAGNOSIS — Z951 Presence of aortocoronary bypass graft: Secondary | ICD-10-CM | POA: Diagnosis not present

## 2018-06-02 DIAGNOSIS — I10 Essential (primary) hypertension: Secondary | ICD-10-CM | POA: Diagnosis not present

## 2018-06-02 DIAGNOSIS — Z4889 Encounter for other specified surgical aftercare: Secondary | ICD-10-CM | POA: Diagnosis not present

## 2018-06-02 NOTE — Telephone Encounter (Signed)
Wife of patient is calling. Pt had heart surgery, and went to a follow-up with his surgeon today. The heart surgeon said that he needs to get in to be seen by his cardiologist ASAP. I informed the wife of current COVID Precautions, and told her that Dr. Johnsie Cancel does not have any openings until Friday 04/17, and the wife feels like that is too long to wait.  Pt and wife were told that Dr. Johnsie Cancel will be maintaining medication from here on out. Wife is concerned that the pt dizziness will not subside and only get worse over time

## 2018-06-02 NOTE — Telephone Encounter (Signed)
Called patient's wife back about her message. Patient saw Dr. Cheree Ditto (Surgeon at [facility]) today for f/u after bypass. Patient's wife stated they told him to follow up with his cardiologist soon. Per patient's wife EKG and chest xray looked good. Patient was taken off Lasix due to dizziness and patient's BP being low (110/72, 105/73, 105/82). Patient had bypass on 3/10 and was taken off Losartan at discharge. Patient is only taking metoprolol 50 BID now. Wife is really concerned about patient feeling dizzy and having a follow up visit. Informed patient's wife that we are doing virtual visits, and she was agreeable to this. Will send message to Dr. Johnsie Cancel for advisement on BP and appointment arrangement.

## 2018-06-03 ENCOUNTER — Telehealth: Payer: Self-pay | Admitting: Internal Medicine

## 2018-06-03 NOTE — Telephone Encounter (Signed)
The patient had a CABG 05/12/2018, I noticed  he had a follow-up yesterday with cardiology and has a visit with Dr. Johnsie Cancel soon. Please cancel tomorrow's appointment w/ me unless the patient has a specific issues he needs to discuss with me.  If that is the case, a video conference would be better. Please reschedule a visit in 4 to 6 weeks either face-to-face or video conference.Keith Black

## 2018-06-03 NOTE — Telephone Encounter (Signed)
Copied from [place] [phone]. Topic: General - Other >> Jun 03, 2018  4:07 PM Rutherford Nail, NT wrote: Reason for CRM: Patient returning a call to [facility]. Please advise.

## 2018-06-03 NOTE — Telephone Encounter (Signed)
Can set up virtual visit for Thursday morning.  See if you can get 5-6 more virtual visits for Thursday morning scheduled as I don't have mitral clip. Can look at Covid 19 cancel list for acuity 1 with my name

## 2018-06-03 NOTE — Telephone Encounter (Signed)
LMOM asking for call back.  

## 2018-06-03 NOTE — Telephone Encounter (Signed)
Made patient an appointment on Friday for virtual visit, since Thursday is not available. Patient's wife instructed to have patient sign up for mychart to send instructions for Webex office visit.      Virtual Visit Pre-Appointment Phone Call  Steps For Call:  1. Confirm consent - "In the setting of the current Covid19 crisis, you are scheduled for a (phone or video) visit with your provider on (date) at (time).  Just as we do with many in-office visits, in order for you to participate in this visit, we must obtain consent.  If you'd like, I can send this to your mychart (if signed up) or email for you to review.  Otherwise, I can obtain your verbal consent now.  All virtual visits are billed to your insurance company just like a normal visit would be.  By agreeing to a virtual visit, we'd like you to understand that the technology does not allow for your provider to perform an examination, and thus may limit your provider's ability to fully assess your condition.  Finally, though the technology is pretty good, we cannot assure that it will always work on either your or our end, and in the setting of a video visit, we may have to convert it to a phone-only visit.  In either situation, we cannot ensure that we have a secure connection.  Are you willing to proceed?"  2. Give patient instructions for WebEx download to smartphone as below if video visit  3. Advise patient to be prepared with any vital sign or heart rhythm information, their current medicines, and a piece of paper and pen handy for any instructions they may receive the day of their visit  4. Inform patient they will receive a phone call 15 minutes prior to their appointment time (may be from unknown caller ID) so they should be prepared to answer  5. Confirm that appointment type is correct in Epic appointment notes (video vs telephone)    TELEPHONE CALL NOTE  Keith Black. has been deemed a candidate for a follow-up  tele-health visit to limit community exposure during the Covid-19 pandemic. I spoke with the patient via phone to ensure availability of phone/video source, confirm preferred email & phone number, and discuss instructions and expectations.  I reminded Keith Black. to be prepared with any vital sign and/or heart rhythm information that could potentially be obtained via home monitoring, at the time of his visit. I reminded Keith Black. to expect a phone call at the time of his visit if his visit.  Did the patient verbally acknowledge consent to treatment? yes  Michaelyn Barter, RN 06/03/2018 1:26 PM   DOWNLOADING THE [place], go to [employer] and type in WebEx in the search bar. [place] [employer], the blue/green circle. The app is free but as with any other app downloads, their phone may require them to verify saved payment information or Apple password. The patient does NOT have to create an account.  - If Android, ask patient to go to [employer] and type in WebEx in the search bar. [place] [employer], the blue/green circle. The app is free but as with any other app downloads, their phone may require them to verify saved payment information or Android password. The patient does NOT have to create an account.   CONSENT FOR TELE-HEALTH VISIT - PLEASE REVIEW  I hereby voluntarily request, consent and authorize  [facility] and its employed or contracted physicians, Engineer, materials, nurse practitioners or other licensed health care professionals (the Practitioner), to provide me with telemedicine health care services (the "Services") as deemed necessary by the treating Practitioner. I acknowledge and consent to receive the Services by the Practitioner via telemedicine. I understand that the telemedicine visit will involve communicating with the Practitioner through live audiovisual communication technology and the  disclosure of certain medical information by electronic transmission. I acknowledge that I have been given the opportunity to request an in-person assessment or other available alternative prior to the telemedicine visit and am voluntarily participating in the telemedicine visit.  I understand that I have the right to withhold or withdraw my consent to the use of telemedicine in the course of my care at any time, without affecting my right to future care or treatment, and that the Practitioner or I may terminate the telemedicine visit at any time. I understand that I have the right to inspect all information obtained and/or recorded in the course of the telemedicine visit and may receive copies of available information for a reasonable fee.  I understand that some of the potential risks of receiving the Services via telemedicine include:  Marland Kitchen Delay or interruption in medical evaluation due to technological equipment failure or disruption; . Information transmitted may not be sufficient (e.g. poor resolution of images) to allow for appropriate medical decision making by the Practitioner; and/or  . In rare instances, security protocols could fail, causing a breach of personal health information.  Furthermore, I acknowledge that it is my responsibility to provide information about my medical history, conditions and care that is complete and accurate to the best of my ability. I acknowledge that Practitioner's advice, recommendations, and/or decision may be based on factors not within their control, such as incomplete or inaccurate data provided by me or distortions of diagnostic images or specimens that may result from electronic transmissions. I understand that the practice of medicine is not an exact science and that Practitioner makes no warranties or guarantees regarding treatment outcomes. I acknowledge that I will receive a copy of this consent concurrently upon execution via email to the email address I  last provided but may also request a printed copy by calling the office of [facility].    I understand that my insurance will be billed for this visit.   I have read or had this consent read to me. . I understand the contents of this consent, which adequately explains the benefits and risks of the Services being provided via telemedicine.  . I have been provided ample opportunity to ask questions regarding this consent and the Services and have had my questions answered to my satisfaction. . I give my informed consent for the services to be provided through the use of telemedicine in my medical care  By participating in this telemedicine visit I agree to the above.

## 2018-06-04 ENCOUNTER — Telehealth (HOSPITAL_COMMUNITY): Payer: Self-pay

## 2018-06-04 ENCOUNTER — Inpatient Hospital Stay: Payer: BLUE CROSS/BLUE SHIELD | Admitting: Internal Medicine

## 2018-06-04 ENCOUNTER — Telehealth: Payer: Self-pay | Admitting: Cardiovascular Disease

## 2018-06-04 NOTE — Telephone Encounter (Signed)
LMOM informing Pt to return call.  

## 2018-06-04 NOTE — Telephone Encounter (Signed)
Recv'd call from pt, pt express interested in the Cardiac Rehab Program. Explained scheduling process and went over insurance, patient verbalized understanding.Adv pt our department is closed due to COVID-19 and once we resume, we will contact him.

## 2018-06-04 NOTE — Telephone Encounter (Signed)
Spoke w/ Pt- visit rescheduled for 4 weeks.

## 2018-06-04 NOTE — Telephone Encounter (Signed)
New message      Pt is set up on mychart and he declined to do a webex trial run.  He said he had done "zoom" before and webex was about the same.

## 2018-06-04 NOTE — Telephone Encounter (Signed)
Follow up    Mychart is now activated.  Pt did not want to do a webex trial run. He is familiar with the app.

## 2018-06-04 NOTE — Telephone Encounter (Signed)
New message     Called pt to set up mychart.  Pt states that they got the mychart link to set up username and password.  Told pt importance of mychart for virtual visit.  Wife said she would do it today.

## 2018-06-04 NOTE — Telephone Encounter (Signed)
Pt insurance is active and benefits verified through [place]. Co-pay $0.00, DED $4,000.00/$4,000.00 met, out of pocket $8,000.00/$8,000.00 met, co-insurance 20%. No pre-authorization required. Passport, 06/04/2018 @ 4:17PM, REF# [id number]  Will contact patient to see if he is interested in the Cardiac Rehab Program. If interested, patient will need to complete follow up appt. Once completed, patient will be contacted for scheduling upon review by the RN Navigator.

## 2018-06-05 ENCOUNTER — Encounter: Payer: Self-pay | Admitting: Cardiovascular Disease

## 2018-06-05 NOTE — Progress Notes (Signed)
Virtual Visit via Video Note    Evaluation Performed:  Follow-up visit  This visit type was conducted due to national recommendations for restrictions regarding the COVID-19 Pandemic (e.g. social distancing).  This format is felt to be most appropriate for this patient at this time.  All issues noted in this document were discussed and addressed.  No physical exam was performed (except for noted visual exam findings with Video Visits).  Please refer to the patient's chart (MyChart message for video visits and phone note for telephone visits) for the patient's consent to telehealth for [facility].  Date:  06/06/2018   ID:  Keith Pile., DOB 06-11-1965, MRN [id number]  Patient Location:  Home  Provider location:   Office  PCP:  Colon Branch, MD  Cardiologist:  Jenkins Rouge, MD   Electrophysiologist:  None   Chief Complaint:  Post AVR  History of Present Illness:    Keith Pile. is a 53 y.o. male who presents via audio/video conferencing for a telehealth visit today.   Initially seen 04/09/18 in hospital for consult CRF;s HTN, HLD Had had previous acute left cortical infarct with TIA 2 months prior ? Apical thrombus on echo Cardiac MRI done 04/09/18 EF 33% no apical thrombus viable anterior apical wall  Cath 04/10/18 with severe 2VD involving RCA and LAD with some left to right collaterals Plan was to anticoagulate for 2-3 months and let him recover from stroke then proceed with CABG with Dr Cyndia Bent However he decided to have surgery at [facility] with Dr Cheree Ditto   Surgery involved LIMA to LAD and RIMA to RCA Done 05/13/18 Was d/c on eliquis 5 bid to end 06/17/18   They did take the left upper thigh vein but did not use it   The patient does not have symptoms concerning for COVID-19 infection (fever, chills, cough, or new shortness of breath).    Prior CV studies:   The following studies were reviewed today:  CABG [facility] 05/13/18 Cardiac MRI 04/09/18 Cardiac Cath  04/10/18 Echo  04/09/18  Past  Medical History:  Diagnosis Date  . Allergy   . CVA (cerebrovascular accident) ([place]) 04/08/2018  . GERD (gastroesophageal reflux disease)   . High cholesterol   . History of chicken pox   . Hypertension   . LV (left ventricular) mural thrombus without MI   . Ocular migraine    Past Surgical History:  Procedure Laterality Date  . HERNIA REPAIR Bilateral   . LEFT HEART CATH AND CORONARY ANGIOGRAPHY N/A 04/10/2018   Procedure: LEFT HEART CATH AND CORONARY ANGIOGRAPHY;  Surgeon: Lorretta Harp, MD;  Location: [facility];  Service: Cardiovascular;  Laterality: N/A;     Current Meds  Medication Sig  . aspirin EC 81 MG tablet Take 81 mg by mouth daily.     Allergies:   Patient has no known allergies.   Social History   Tobacco Use  . Smoking status: Never Smoker  . Smokeless tobacco: Current User    Types: Snuff  Substance Use Topics  . Alcohol use: Yes    Alcohol/week: 25.0 standard drinks    Types: 25 Glasses of wine per week  . Drug use: Never     Family Hx: The patient's family history includes Allergies in his mother; Asthma in his mother; Heart disease (age of onset: 62) in his mother; Stroke (age of onset: 60) in his father. There is no history of Colon cancer or Prostate cancer.  ROS:   Please see the history of present illness.     All other systems reviewed and are negative.   Labs/Other Tests and Data Reviewed:    Recent Labs: 04/25/2018: TSH 1.05 04/30/2018: ALT 59; BUN 16; Creatinine, Ser 1.12; Creatinine, Ser 1.20; Hemoglobin 14.7; Platelets 338; Potassium 4.0; Sodium 134   Recent Lipid Panel Lab Results  Component Value Date/Time   CHOL 215 (H) 04/09/2018 05:23 AM   TRIG 201 (H) 04/09/2018 05:23 AM   HDL 59 04/09/2018 05:23 AM   CHOLHDL 3.6 04/09/2018 05:23 AM   LDLCALC 116 (H) 04/09/2018 05:23 AM    Wt Readings from Last 3 Encounters:  06/06/18 71.7 kg  04/30/18 79.4 kg  04/25/18 79.4 kg     Objective:    Vital Signs:  BP 106/78    Pulse 84   Ht 5\' 8"  (1.727 m)   Wt 71.7 kg   BMI 24.02 kg/m    Well nourished, well developed male in no acute distress. No respiratory distress Sternum looks to be healing well Legs ok with no edema There were no vein harvest sights  ASSESSMENT & PLAN:    1.  CAD/CABG:  05/13/18 presented with TIA/Stroke not angina Received LIMA to LAD and RIMA to RCA Circumflex not grafted continue ASA and statin  2. Cardiomyopathy EF 33% by MRI 05/08/18 will reorder for June to see how much recovery he has had EF was 35-40% by echo 04/09/18 and no scar on MRI so don't think he needs life vest. Continue beta blocker add ARB   3. Stroke presumed from LV apical thrombus carotids ok Has been on eliquis to be stopped 06/17/18 4. HLD on statin labs with [place] in 6 weeks Hct/Hb Comprehensive metabolic and lipids    [id number] Education: The signs and symptoms of COVID-19 were discussed with the patient and how to seek care for testing (follow up with PCP or arrange E-visit).  The importance of social distancing was discussed today.  Patient Risk:   After full review of this patient's clinical status, I feel that they are at least moderate risk at this time.  Time:   Today, I have spent 30 minutes with the patient with telehealth technology discussing his stroke, CAD, medication, and future need For evaluation of his EF.     Medication Adjustments/Labs and Tests Ordered: Current medicines are reviewed at length with the patient today.  Concerns regarding medicines are outlined above.  Tests Ordered: No orders of the defined types were placed in this encounter.  Medication Changes: No orders of the defined types were placed in this encounter.   Disposition:  Follow up 6 weeks video MRI in June for EF labs in 6 weeks  Signed, Jenkins Rouge, MD  06/06/2018 8:55 AM    Keith Black

## 2018-06-06 ENCOUNTER — Other Ambulatory Visit: Payer: Self-pay

## 2018-06-06 ENCOUNTER — Telehealth (INDEPENDENT_AMBULATORY_CARE_PROVIDER_SITE_OTHER): Payer: BLUE CROSS/BLUE SHIELD | Admitting: Cardiovascular Disease

## 2018-06-06 ENCOUNTER — Encounter: Payer: Self-pay | Admitting: Cardiovascular Disease

## 2018-06-06 VITALS — BP 106/78 | HR 84 | Ht 68.0 in | Wt 158.0 lb

## 2018-06-06 DIAGNOSIS — I25810 Atherosclerosis of coronary artery bypass graft(s) without angina pectoris: Secondary | ICD-10-CM | POA: Diagnosis not present

## 2018-06-06 DIAGNOSIS — E785 Hyperlipidemia, unspecified: Secondary | ICD-10-CM | POA: Diagnosis not present

## 2018-06-06 DIAGNOSIS — I429 Cardiomyopathy, unspecified: Secondary | ICD-10-CM | POA: Diagnosis not present

## 2018-06-06 NOTE — Addendum Note (Signed)
Addended by: Aris Georgia, Forbes Loll L on: 06/06/2018 09:26 AM   Modules accepted: Orders

## 2018-06-06 NOTE — Patient Instructions (Addendum)
Medication Instructions:   If you need a refill on your cardiac medications before your next appointment, please call your pharmacy.   Lab work: Your physician recommends that you return for lab work in: 6 weeks for fasting CMET, Hemoglobin, Hematocrit, and Lipid Panel.  If you have labs (blood work) drawn today and your tests are completely normal, you will receive your results only by: Marland Kitchen MyChart Message (if you have MyChart) OR . A paper copy in the mail If you have any lab test that is abnormal or we need to change your treatment, we will call you to review the results. Medication Instructions:   If you need a refill on your cardiac medications before your next appointment, please call your pharmacy.   Lab work: Your physician recommends that you return for lab work in: 6 weeks for fasting CMET, Hemoglobin, Hematocrit, and Lipid Panel.  If you have labs (blood work) drawn today and your tests are completely normal, you will receive your results only by: Marland Kitchen MyChart Message (if you have MyChart) OR . A paper copy in the mail If you have any lab test that is abnormal or we need to change your treatment, we will call you to review the results.  Testing/Procedures: Your physician has requested that you have a cardiac MRI in June. Cardiac MRI uses a computer to create images of your heart as its beating, producing both still and moving pictures of your heart and major blood vessels. For further information please visit [email or web address] Please follow the instruction sheet given to you today for more information.  Follow-Up: At [facility], you and your health needs are our priority.  As part of our continuing mission to provide you with exceptional heart care, we have created designated Provider Care Teams.  These Care Teams include your primary Cardiologist (physician) and Advanced Practice Providers (APPs -  Physician Assistants and Nurse Practitioners) who all work together to provide  you with the care you need, when you need it. You will need a follow up appointment in 6 weeks for virtual visit. You may see Jenkins Rouge, MD or one of the following Advanced Practice Providers on your designated Care Team:   Truitt Merle, NP Cecilie Kicks, NP . Kathyrn Drown, NP  Testing/Procedures: Your physician has requested that you have a cardiac MRI in June. Cardiac MRI uses a computer to create images of your heart as its beating, producing both still and moving pictures of your heart and major blood vessels. For further information please visit [email or web address] Please follow the instruction sheet given to you today for more information.  Follow-Up: At [facility], you and your health needs are our priority.  As part of our continuing mission to provide you with exceptional heart care, we have created designated Provider Care Teams.  These Care Teams include your primary Cardiologist (physician) and Advanced Practice Providers (APPs -  Physician Assistants and Nurse Practitioners) who all work together to provide you with the care you need, when you need it. You will need a follow up appointment in 6 weeks for virtual visit. You may see Jenkins Rouge, MD or one of the following Advanced Practice Providers on your designated Care Team:   Truitt Merle, NP Cecilie Kicks, NP . Kathyrn Drown, NP

## 2018-06-09 DIAGNOSIS — I6389 Other cerebral infarction: Secondary | ICD-10-CM | POA: Diagnosis not present

## 2018-06-20 ENCOUNTER — Ambulatory Visit: Payer: BLUE CROSS/BLUE SHIELD | Admitting: Cardiovascular Disease

## 2018-06-30 ENCOUNTER — Other Ambulatory Visit: Payer: Self-pay | Admitting: Internal Medicine

## 2018-07-03 ENCOUNTER — Telehealth (HOSPITAL_COMMUNITY): Payer: Self-pay

## 2018-07-04 ENCOUNTER — Other Ambulatory Visit: Payer: Self-pay | Admitting: Internal Medicine

## 2018-07-04 MED ORDER — ASPIRIN EC 81 MG PO TBEC: 81.0000 mg | DELAYED_RELEASE_TABLET | Freq: Every day | ORAL | 3 refills | Status: DC

## 2018-07-04 MED ORDER — PANTOPRAZOLE SODIUM 40 MG PO TBEC: 40.0000 mg | DELAYED_RELEASE_TABLET | Freq: Every day | ORAL | 3 refills | Status: DC

## 2018-07-04 NOTE — Telephone Encounter (Signed)
Refills sent

## 2018-07-04 NOTE — Telephone Encounter (Signed)
Copied from [place] [id number]. Topic: Quick Communication - Rx Refill/Question >> Jul 04, 2018  9:44 AM Burchel, Abbi R wrote: Medication: aspirin EC 81 MG tablet, pantoprazole (PROTONIX) 40 MG tablet  Pt states Pantoprazole was originally prescribed by previous provider, but that Dr Larose Kells stated he would refill for pt during last OV.  Preferred Pharmacy: [id number] - [place], [address]  [phone] (Phone) [phone] (Fax)    Pt was advised that RX refills may take up to 3 business days. We ask that you follow-up with your pharmacy.

## 2018-07-08 ENCOUNTER — Telehealth (HOSPITAL_COMMUNITY): Payer: Self-pay | Admitting: *Deleted

## 2018-07-08 ENCOUNTER — Ambulatory Visit: Payer: BLUE CROSS/BLUE SHIELD | Admitting: Internal Medicine

## 2018-07-08 NOTE — Telephone Encounter (Signed)
Pt returned call and is very interested in participating in cardiac rehab.  Pt advised  Cardiac Rehab continued closure due to adherence of national recommendation for Covid -19 in group setting.  Pt is exercising with walking every day.  Will send pt exercise handouts for warm up and cool down stretches and handweights.  Pt feels he and his wife have a good handle on heart healthy nutrition. Cherre Huger, BSN Cardiac and Training and development officer

## 2018-07-11 ENCOUNTER — Telehealth: Payer: Self-pay | Admitting: Cardiovascular Disease

## 2018-07-11 NOTE — Telephone Encounter (Signed)
New message:   Patient calling concering his lab appt on Monday wanting to know if he need to fast.

## 2018-07-11 NOTE — Telephone Encounter (Signed)
I spoke with pt and told him he should fast after midnight for lab work

## 2018-07-14 ENCOUNTER — Other Ambulatory Visit: Payer: Self-pay

## 2018-07-14 ENCOUNTER — Telehealth: Payer: Self-pay

## 2018-07-14 ENCOUNTER — Other Ambulatory Visit: Payer: BLUE CROSS/BLUE SHIELD | Admitting: *Deleted

## 2018-07-14 DIAGNOSIS — I429 Cardiomyopathy, unspecified: Secondary | ICD-10-CM

## 2018-07-14 DIAGNOSIS — E785 Hyperlipidemia, unspecified: Secondary | ICD-10-CM | POA: Diagnosis not present

## 2018-07-14 DIAGNOSIS — I25810 Atherosclerosis of coronary artery bypass graft(s) without angina pectoris: Secondary | ICD-10-CM

## 2018-07-14 LAB — COMPREHENSIVE METABOLIC PANEL
ALT: 20 IU/L (ref 0–44)
AST: 23 IU/L (ref 0–40)
Albumin/Globulin Ratio: 1.8 (ref 1.2–2.2)
Albumin: 4.8 g/dL (ref 3.8–4.9)
Alkaline Phosphatase: 58 IU/L (ref 39–117)
BUN/Creatinine Ratio: 16 (ref 9–20)
BUN: 17 mg/dL (ref 6–24)
Bilirubin Total: 0.6 mg/dL (ref 0.0–1.2)
CO2: 23 mmol/L (ref 20–29)
Calcium: 10.1 mg/dL (ref 8.7–10.2)
Chloride: 97 mmol/L (ref 96–106)
Creatinine, Ser: 1.05 mg/dL (ref 0.76–1.27)
GFR calc Af Amer: 94 mL/min/{1.73_m2} (ref >59)
GFR calc non Af Amer: 81 mL/min/{1.73_m2} (ref >59)
Globulin, Total: 2.7 g/dL (ref 1.5–4.5)
Glucose: 95 mg/dL (ref 65–99)
Potassium: 4.7 mmol/L (ref 3.5–5.2)
Sodium: 138 mmol/L (ref 134–144)
Total Protein: 7.5 g/dL (ref 6.0–8.5)

## 2018-07-14 LAB — HEMATOCRIT: Hematocrit: 44.7 % (ref 37.5–51.0)

## 2018-07-14 LAB — LIPID PANEL
Chol/HDL Ratio: 3.5 ratio (ref 0.0–5.0)
Cholesterol, Total: 174 mg/dL (ref 100–199)
HDL: 50 mg/dL (ref >39)
LDL Calculated: 94 mg/dL (ref 0–99)
Triglycerides: 149 mg/dL (ref 0–149)
VLDL Cholesterol Cal: 30 mg/dL (ref 5–40)

## 2018-07-14 LAB — HEMOGLOBIN: Hemoglobin: 14.2 g/dL (ref 13.0–17.7)

## 2018-07-14 MED ORDER — ATORVASTATIN CALCIUM 40 MG PO TABS: 40.0000 mg | ORAL_TABLET | Freq: Every day | ORAL | 3 refills | Status: DC

## 2018-07-14 NOTE — Telephone Encounter (Signed)
-----   Message from Josue Hector, MD sent at 07/14/2018  3:59 PM EDT ----- LDL above target for CAD/CABG at 94 goal less than 70 Need stronger statin Start lipitor 40 mg and d/c zocor repeat labs in 3 months

## 2018-07-14 NOTE — Telephone Encounter (Signed)
Patient aware of results. Will send medication to patient's pharmacy and update medication list. Patient will come in on 10/13/18 for lab work.

## 2018-07-17 NOTE — Progress Notes (Signed)
Virtual Visit via Video Note    Evaluation Performed:  Follow-up visit  This visit type was conducted due to national recommendations for restrictions regarding the COVID-19 Pandemic (e.g. social distancing).  This format is felt to be most appropriate for this patient at this time.  All issues noted in this document were discussed and addressed.  No physical exam was performed (except for noted visual exam findings with Video Visits).  Please refer to the patient's chart (MyChart message for video visits and phone note for telephone visits) for the patient's consent to telehealth for [facility].  Date:  07/21/2018   ID:  Keith Black., DOB 03/05/1966, MRN [id number]  Patient Location:  Home  Provider location:   Office  PCP:  Colon Branch, MD  Cardiologist:  Jenkins Rouge, MD   Electrophysiologist:  None   Chief Complaint:  Post AVR  History of Present Illness:    Keith Black. is a 53 y.o. male who presents via audio/video conferencing for a telehealth visit today.   Initially seen 04/09/18 in hospital for consult CRF;s HTN, HLD Had had previous acute left cortical infarct with TIA 2 months prior ? Apical thrombus on echo Cardiac MRI done 04/09/18 EF 33% no apical thrombus viable anterior apical wall  Cath 04/10/18 with severe 2VD involving RCA and LAD with some left to right collaterals Plan was to anticoagulate for 2-3 months and let him recover from stroke then proceed with CABG with Dr Cyndia Bent However he decided to have surgery at [facility] with Dr Cheree Ditto   Surgery involved LIMA to LAD and RIMA to RCA Done 05/13/18 Was d/c on eliquis 5 bid to end 06/17/18   They did take the left upper thigh vein but did not use it   Was on zocor for HLD but LDL 94  07/14/18 changed to Lipitor 40 mg daily To have repeat labs in August  Seems hesitant to follow guidelines to lower LDL to below 49 Work is again stressful for him    The patient does not have symptoms concerning for COVID-19 infection  (fever, chills, cough, or new shortness of breath).    Prior CV studies:   The following studies were reviewed today:  CABG [facility] 05/13/18 Cardiac MRI 04/09/18 Cardiac Cath  04/10/18 Echo  04/09/18  Past Medical History:  Diagnosis Date  . Allergy   . CVA (cerebrovascular accident) ([place]) 04/08/2018  . GERD (gastroesophageal reflux disease)   . High cholesterol   . History of chicken pox   . Hypertension   . LV (left ventricular) mural thrombus without MI   . Ocular migraine    Past Surgical History:  Procedure Laterality Date  . HERNIA REPAIR Bilateral   . LEFT HEART CATH AND CORONARY ANGIOGRAPHY N/A 04/10/2018   Procedure: LEFT HEART CATH AND CORONARY ANGIOGRAPHY;  Surgeon: Lorretta Harp, MD;  Location: [place] [facility];  Service: Cardiovascular;  Laterality: N/A;     Current Meds  Medication Sig  . acetaminophen (TYLENOL) 325 MG tablet Take 650 mg by mouth as needed for pain.  Marland Kitchen ALPRAZolam (XANAX) 0.25 MG tablet Take 1 tablet (0.25 mg total) by mouth 2 (two) times daily as needed for anxiety.  Marland Kitchen apixaban (ELIQUIS) 5 MG TABS tablet Take 1 tablet (5 mg total) by mouth 2 (two) times daily for 30 days.  Marland Kitchen aspirin EC 81 MG tablet Take 1 tablet (81 mg total) by mouth daily.  Marland Kitchen atorvastatin (LIPITOR) 40 MG tablet  Take 1 tablet (40 mg total) by mouth daily.  . cetirizine (ZYRTEC) 10 MG tablet Take 10 mg by mouth daily.  . Cholecalciferol (CVS VIT D 5000 HIGH-POTENCY PO) Take 1 capsule by mouth daily.  . metoprolol tartrate (LOPRESSOR) 50 MG tablet Take 1 tablet (50 mg total) by mouth 2 (two) times daily for 30 days.  . pantoprazole (PROTONIX) 40 MG tablet Take 1 tablet (40 mg total) by mouth daily.     Allergies:   Patient has no known allergies.   Social History   Tobacco Use  . Smoking status: Never Smoker  . Smokeless tobacco: Current User    Types: Snuff  Substance Use Topics  . Alcohol use: Yes    Alcohol/week: 25.0 standard drinks    Types: 25 Glasses of wine per week   . Drug use: Never     Family Hx: The patient's family history includes Allergies in his mother; Asthma in his mother; Heart disease (age of onset: 10) in his mother; Stroke (age of onset: 55) in his father. There is no history of Colon cancer or Prostate cancer.  ROS:   Please see the history of present illness.     All other systems reviewed and are negative.   Labs/Other Tests and Data Reviewed:    Recent Labs: 04/25/2018: TSH 1.05 04/30/2018: Platelets 338 07/14/2018: ALT 20; BUN 17; Creatinine, Ser 1.05; Hemoglobin 14.2; Potassium 4.7; Sodium 138   Recent Lipid Panel Lab Results  Component Value Date/Time   CHOL 174 07/14/2018 08:17 AM   TRIG 149 07/14/2018 08:17 AM   HDL 50 07/14/2018 08:17 AM   CHOLHDL 3.5 07/14/2018 08:17 AM   CHOLHDL 3.6 04/09/2018 05:23 AM   LDLCALC 94 07/14/2018 08:17 AM    Wt Readings from Last 3 Encounters:  07/21/18 68.5 kg  06/06/18 71.7 kg  04/30/18 79.4 kg     Objective:    Vital Signs:  BP 119/84   Pulse 80   Ht 5\' 8"  (1.727 m)   Wt 68.5 kg   BMI 22.96 kg/m    Well nourished, well developed male in no acute distress. No respiratory distress Sternum looks to be healing well Legs ok with no edema There were no vein harvest sights  ASSESSMENT & PLAN:    1.  CAD/CABG:  05/13/18 presented with TIA/Stroke not angina Received LIMA to LAD and RIMA to RCA Circumflex not grafted continue ASA and statin  2. Cardiomyopathy EF 33% by MRI 05/08/18 will reorder for June to see how much recovery he has had EF was 35-40% by echo 04/09/18 and no scar on MRI so don't think he needs life vest. Continue beta blocker add ARB   3. Stroke presumed from LV apical thrombus carotids ok Had been on eliquis  stopped 06/17/18 4. HLD f/u labs in August now on Lipitor 40 mg daily    COVID-19 Education: The signs and symptoms of COVID-19 were discussed with the patient and how to seek care for testing (follow up with PCP or arrange E-visit).  The importance of  social distancing was discussed today.  Patient Risk:   After full review of this patient's clinical status, I feel that they are at least moderate risk at this time.  Time:   Today, I have spent 30 minutes with the patient with telehealth technology discussing his stroke, CAD, medication, and future need For evaluation of his EF.     Medication Adjustments/Labs and Tests Ordered: Current medicines are reviewed at length with  the patient today.  Concerns regarding medicines are outlined above.  Tests Ordered:  Cardiac MRI for  ischemic DCM AICD risk stratification  Repeat Lipid and Liver August  Medication Changes: No orders of the defined types were placed in this encounter.   Disposition:  Follow up 3 months   Signed, Jenkins Rouge, MD  07/21/2018 8:29 AM    [place]

## 2018-07-21 ENCOUNTER — Other Ambulatory Visit: Payer: Self-pay

## 2018-07-21 ENCOUNTER — Telehealth (INDEPENDENT_AMBULATORY_CARE_PROVIDER_SITE_OTHER): Payer: BLUE CROSS/BLUE SHIELD | Admitting: Cardiovascular Disease

## 2018-07-21 ENCOUNTER — Encounter: Payer: Self-pay | Admitting: Cardiovascular Disease

## 2018-07-21 DIAGNOSIS — I2510 Atherosclerotic heart disease of native coronary artery without angina pectoris: Secondary | ICD-10-CM

## 2018-07-21 MED ORDER — METOPROLOL TARTRATE 25 MG PO TABS: 25.0000 mg | ORAL_TABLET | Freq: Two times a day (BID) | ORAL | 3 refills | Status: DC

## 2018-07-21 NOTE — Patient Instructions (Addendum)
Medication Instructions:  Your physician has recommended you make the following change in your medication:  1-Decrease Metoprolol 25 mg by mouth twice daily  If you need a refill on your cardiac medications before your next appointment, please call your pharmacy.   Lab work: Your physician recommends that you return for lab work in: August for fasting lipid and liver panel. You have already been schedule for this on 10/13/18.  If you have labs (blood work) drawn today and your tests are completely normal, you will receive your results only by: Marland Kitchen MyChart Message (if you have MyChart) OR . A paper copy in the mail If you have any lab test that is abnormal or we need to change your treatment, we will call you to review the results.  Testing/Procedures: Your physician has requested that you have a cardiac MRI in June. Cardiac MRI uses a computer to create images of your heart as its beating, producing both still and moving pictures of your heart and major blood vessels. For further information please visit [email or web address] Please follow the instruction sheet given to you today for more information.  Follow-Up: At [facility], you and your health needs are our priority.  As part of our continuing mission to provide you with exceptional heart care, we have created designated Provider Care Teams.  These Care Teams include your primary Cardiologist (physician) and Advanced Practice Providers (APPs -  Physician Assistants and Nurse Practitioners) who all work together to provide you with the care you need, when you need it. You will need a follow up appointment in 3 months.  You may see Jenkins Rouge, MD or one of the following Advanced Practice Providers on your designated Care Team:   Truitt Merle, NP Cecilie Kicks, NP . Kathyrn Drown, NP   Someone will call you to set up appointments for MRI and follow-up.

## 2018-07-29 ENCOUNTER — Ambulatory Visit (INDEPENDENT_AMBULATORY_CARE_PROVIDER_SITE_OTHER): Payer: BLUE CROSS/BLUE SHIELD | Admitting: Internal Medicine

## 2018-07-29 ENCOUNTER — Other Ambulatory Visit: Payer: Self-pay

## 2018-07-29 DIAGNOSIS — E785 Hyperlipidemia, unspecified: Secondary | ICD-10-CM

## 2018-07-29 DIAGNOSIS — I10 Essential (primary) hypertension: Secondary | ICD-10-CM

## 2018-07-29 DIAGNOSIS — R42 Dizziness and giddiness: Secondary | ICD-10-CM

## 2018-07-29 DIAGNOSIS — K219 Gastro-esophageal reflux disease without esophagitis: Secondary | ICD-10-CM

## 2018-07-29 DIAGNOSIS — R5383 Other fatigue: Secondary | ICD-10-CM | POA: Diagnosis not present

## 2018-07-29 MED ORDER — PANTOPRAZOLE SODIUM 40 MG PO TBEC: 40.0000 mg | DELAYED_RELEASE_TABLET | Freq: Every day | ORAL | 3 refills | Status: DC

## 2018-07-29 NOTE — Progress Notes (Signed)
Subjective:    Patient ID: Keith Pile., male    DOB: 1966-01-21, 53 y.o.   MRN: [id number]  DOS:  07/29/2018 Type of visit - description: Virtual Visit via Video Note  I connected with@ on 07/30/18 at  4:00 PM EDT by a video enabled telemedicine application and verified that I am speaking with the correct person using two identifiers.   THIS ENCOUNTER IS A VIRTUAL VISIT DUE TO COVID-19 - PATIENT WAS NOT SEEN IN THE OFFICE. PATIENT HAS CONSENTED TO VIRTUAL VISIT / TELEMEDICINE VISIT   Location of patient: home  Location of provider: office  I discussed the limitations of evaluation and management by telemedicine and the availability of in person appointments. The patient expressed understanding and agreed to proceed.  History of Present Illness: Routine visit, multiple issues Since the last time I saw him, he underwent a CABG 05-2018. Since then is feeling somewhat fatigued and slightly dizzy.  He already discussed that with cardiology and metoprolol dose decreased.  Stressed increased, work-related, handling well.  No insomnia  High cholesterol: Last LDL 94, medications were adjusted by cardiology, patient was reluctant to change medications and like to discuss the issue with me.  GERD: Started protonix, much improved   Review of Systems Diet and exercise are very good, has lost approximately 20 pounds No fever chills No chest pain no difficulty breathing No edema Occasionally has sporadic palpitations, short-lived, not associated with any other symptoms Ambulatory BPs range from 110, 681 with a diastolic in the 27N.  In the last couple of weeks, diastolic has increased to the 80s. In the last few days have developed a migraine, mild and similar to previous migraines, patient is not concerned, thinks is related to work stress.   Past Medical History:  Diagnosis Date  . Allergy   . CVA (cerebrovascular accident) ([place]) 04/08/2018  . GERD (gastroesophageal reflux disease)    . High cholesterol   . History of chicken pox   . Hypertension   . LV (left ventricular) mural thrombus without MI   . Ocular migraine     Past Surgical History:  Procedure Laterality Date  . HERNIA REPAIR Bilateral   . LEFT HEART CATH AND CORONARY ANGIOGRAPHY N/A 04/10/2018   Procedure: LEFT HEART CATH AND CORONARY ANGIOGRAPHY;  Surgeon: Lorretta Harp, MD;  Location: [place] [facility];  Service: Cardiovascular;  Laterality: N/A;    Social History   Socioeconomic History  . Marital status: Married    Spouse name: Not on file  . Number of children: 0  . Years of education: Not on file  . Highest education level: Not on file  Occupational History  . 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[employer]  . Financial resource strain: Not on file  . Food insecurity:    Worry: Not on file    Inability: Not on file  . Transportation needs:    Medical: Not on file    Non-medical: Not on file  Tobacco Use  . Smoking status: Never Smoker  . Smokeless tobacco: Current User    Types: Snuff  Substance and Sexual Activity  . Alcohol use: Yes    Alcohol/week: 25.0 standard drinks    Types: 25 Glasses of wine per week  . Drug use: Never  . Sexual activity: Not on file  Lifestyle  . Physical activity:    Days per week: Not on file    Minutes per session: Not on file  .  Stress: Not on file  Relationships  . Social connections:    Talks on phone: Not on file    Gets together: Not on file    Attends religious service: Not on file    Active member of club or organization: Not on file    Attends meetings of clubs or organizations: Not on file    Relationship status: Not on file  . Intimate partner violence:    Fear of current or ex partner: Not on file    Emotionally abused: Not on file    Physically abused: Not on file    Forced sexual activity: Not on file  Other Topics Concern  . Not on file  Social History Narrative   Lives w/ wife in  [place], moved to  [facility] 2019      Allergies as of 07/29/2018   No Known Allergies     Medication List       Accurate as of Jul 29, 2018  3:55 PM. If you have any questions, ask your nurse or doctor.        acetaminophen 325 MG tablet Commonly known as:  TYLENOL Take 650 mg by mouth as needed for pain.   ALPRAZolam 0.25 MG tablet Commonly known as:  XANAX Take 1 tablet (0.25 mg total) by mouth 2 (two) times daily as needed for anxiety.   apixaban 5 MG Tabs tablet Commonly known as:  Eliquis Take 1 tablet (5 mg total) by mouth 2 (two) times daily for 30 days.   aspirin EC 81 MG tablet Take 1 tablet (81 mg total) by mouth daily.   atorvastatin 40 MG tablet Commonly known as:  LIPITOR Take 1 tablet (40 mg total) by mouth daily.   cetirizine 10 MG tablet Commonly known as:  ZYRTEC Take 10 mg by mouth daily.   CVS VIT D 5000 HIGH-POTENCY PO Take 1 capsule by mouth daily.   metoprolol tartrate 25 MG tablet Commonly known as:  LOPRESSOR Take 1 tablet (25 mg total) by mouth 2 (two) times daily.   pantoprazole 40 MG tablet Commonly known as:  PROTONIX Take 1 tablet (40 mg total) by mouth daily.           Objective:   Physical Exam There were no vitals taken for this visit. This is a virtual video visit, patient is alert oriented x3, no apparent distress.    Assessment      Assessment (new patient, referred by Dr. Johnsie Cancel) HTN  GERD Hyperlipidemia Asthma? Allergies?  CV: -CVA acute 04-2018 -CAD, DX 04-2018, CABG 05-2018 at [facility] -Heart failure, DX 04-2018 in the context of a stroke, EF 30%. Vitamin D deficiency BCC-SCC ?Ocular migraine x 1 11/2017 Moved to [place]  2019.   PLAN HTN: Ambulatory BP is very good, 110/70, 115/80.  On  Metoprolol dose was decreased from 50 mg twice a day to 25 mg twice a day due to dizziness.  He remains a slightly dizzy and fatigued. GERD: Well-controlled on Protonix Hyperlipidemia: LDL was 94 on Jul 14, 2018,  cardiology recommended to change simvastatin to Lipitor.  Patient is quite reluctant, wonders why the LDL needs to be 70.  We had a long discussion about the issue, current guidelines discussed.  Encouraged to discuss with cardiology further. Lifestyle is great, has lost per his own scales 20 pounds CVA: Currently asymptomatic on aspirin and Eliquis. Fatigue, mild dizziness: Since CABG, labs normal, review of systems benign except for mild palpitations.  For now I recommend observation,  should the symptoms increase or if he has more frequent palpitations or they are associated with other symptoms he is to let me know. RTC 4 months.  Today, I spent more than 28  min with the patient: >50% of the time counseling regards LDL goals, listening to his concerns, reviewing the chart.  Multiple questions answered to the best of my ability regarding his cholesterol, fatigue, dizziness in the context of recent CABG    I discussed the assessment and treatment plan with the patient. The patient was provided an opportunity to ask questions and all were answered. The patient agreed with the plan and demonstrated an understanding of the instructions.   The patient was advised to call back or seek an in-person evaluation if the symptoms worsen or if the condition fails to improve as anticipated.

## 2018-07-30 NOTE — Assessment & Plan Note (Signed)
HTN: Ambulatory BP is very good, 110/70, 115/80.  On  Metoprolol dose was decreased from 50 mg twice a day to 25 mg twice a day due to dizziness.  He remains a slightly dizzy and fatigued. GERD: Well-controlled on Protonix Hyperlipidemia: LDL was 94 on Jul 14, 2018, cardiology recommended to change simvastatin to Lipitor.  Patient is quite reluctant, wonders why the LDL needs to be 70.  We had a long discussion about the issue, current guidelines discussed.  Encouraged to discuss with cardiology further. Lifestyle is great, has lost per his own scales 20 pounds CVA: Currently asymptomatic on aspirin and Eliquis. Fatigue, mild dizziness: Since CABG, labs normal, review of systems benign except for mild palpitations.  For now I recommend observation, should the symptoms increase or if he has more frequent palpitations or they are associated with other symptoms he is to let me know. RTC 4 months.

## 2018-08-14 ENCOUNTER — Telehealth (HOSPITAL_COMMUNITY): Payer: Self-pay

## 2018-08-14 NOTE — Telephone Encounter (Signed)
Left VM with pt regarding interest in Virtual Cardiac Rehab Program.    Carma Lair MS, ACSM CEP 1:35 PM 08/14/2018

## 2018-08-15 ENCOUNTER — Encounter: Payer: Self-pay | Admitting: *Deleted

## 2018-08-15 ENCOUNTER — Telehealth: Payer: Self-pay | Admitting: *Deleted

## 2018-08-15 NOTE — Telephone Encounter (Signed)
Left message regarding appointment for Cardaic MRI scheduled  09/09/18 at 12pm.  Requested if patient has questions to call.  I will also mail information letter to patient

## 2018-08-18 DIAGNOSIS — G9389 Other specified disorders of brain: Secondary | ICD-10-CM | POA: Diagnosis not present

## 2018-08-20 NOTE — Telephone Encounter (Signed)
Left message regarding opening for cardiac MRI 08/21/18 at 9am---requested return call ASAP for confirmation

## 2018-08-20 NOTE — Telephone Encounter (Signed)
Left message telling patient Cardiac MRI could be scheduled for 08/21/18 at 9:00am--ask patient to return call ASAP for confirmation.

## 2018-09-01 ENCOUNTER — Telehealth (HOSPITAL_COMMUNITY): Payer: Self-pay

## 2018-09-01 NOTE — Telephone Encounter (Signed)
No response from pt, closed referral. °

## 2018-09-09 ENCOUNTER — Other Ambulatory Visit: Payer: Self-pay

## 2018-09-09 ENCOUNTER — Ambulatory Visit (HOSPITAL_COMMUNITY)
Admission: RE | Admit: 2018-09-09 | Discharge: 2018-09-09 | Disposition: A | Discharge status: Home / Self Care | Payer: BC Managed Care – PPO | Source: Ambulatory Visit | Attending: Cardiovascular Disease | Admitting: Cardiovascular Disease

## 2018-09-09 DIAGNOSIS — I429 Cardiomyopathy, unspecified: Secondary | ICD-10-CM | POA: Insufficient documentation

## 2018-09-09 DIAGNOSIS — I25810 Atherosclerosis of coronary artery bypass graft(s) without angina pectoris: Secondary | ICD-10-CM | POA: Insufficient documentation

## 2018-09-09 MED ORDER — GADOBUTROL 1 MMOL/ML IV SOLN
8.0000 mL | Freq: Once | INTRAVENOUS | Status: AC | PRN
  Administered 2018-09-09: 13:00:00 8 mL via INTRAVENOUS

## 2018-09-12 DIAGNOSIS — Z951 Presence of aortocoronary bypass graft: Secondary | ICD-10-CM | POA: Diagnosis not present

## 2018-09-12 DIAGNOSIS — I2510 Atherosclerotic heart disease of native coronary artery without angina pectoris: Secondary | ICD-10-CM | POA: Diagnosis not present

## 2018-09-13 DIAGNOSIS — G9389 Other specified disorders of brain: Secondary | ICD-10-CM | POA: Diagnosis not present

## 2018-09-17 DIAGNOSIS — I2510 Atherosclerotic heart disease of native coronary artery without angina pectoris: Secondary | ICD-10-CM | POA: Diagnosis not present

## 2018-09-17 DIAGNOSIS — Z951 Presence of aortocoronary bypass graft: Secondary | ICD-10-CM | POA: Diagnosis not present

## 2018-09-19 DIAGNOSIS — I2510 Atherosclerotic heart disease of native coronary artery without angina pectoris: Secondary | ICD-10-CM | POA: Diagnosis not present

## 2018-09-19 DIAGNOSIS — Z951 Presence of aortocoronary bypass graft: Secondary | ICD-10-CM | POA: Diagnosis not present

## 2018-09-22 DIAGNOSIS — I2510 Atherosclerotic heart disease of native coronary artery without angina pectoris: Secondary | ICD-10-CM | POA: Diagnosis not present

## 2018-09-22 DIAGNOSIS — Z951 Presence of aortocoronary bypass graft: Secondary | ICD-10-CM | POA: Diagnosis not present

## 2018-09-25 DIAGNOSIS — I2510 Atherosclerotic heart disease of native coronary artery without angina pectoris: Secondary | ICD-10-CM | POA: Diagnosis not present

## 2018-09-25 DIAGNOSIS — Z951 Presence of aortocoronary bypass graft: Secondary | ICD-10-CM | POA: Diagnosis not present

## 2018-10-10 DIAGNOSIS — R918 Other nonspecific abnormal finding of lung field: Secondary | ICD-10-CM | POA: Diagnosis not present

## 2018-10-10 DIAGNOSIS — Z951 Presence of aortocoronary bypass graft: Secondary | ICD-10-CM | POA: Diagnosis not present

## 2018-10-10 DIAGNOSIS — J90 Pleural effusion, not elsewhere classified: Secondary | ICD-10-CM | POA: Diagnosis not present

## 2018-10-13 ENCOUNTER — Other Ambulatory Visit: Payer: BLUE CROSS/BLUE SHIELD

## 2018-10-13 ENCOUNTER — Encounter: Payer: Self-pay | Admitting: Internal Medicine

## 2018-10-16 ENCOUNTER — Other Ambulatory Visit: Payer: BC Managed Care – PPO

## 2018-10-16 ENCOUNTER — Encounter: Payer: Self-pay | Admitting: Internal Medicine

## 2018-10-17 ENCOUNTER — Other Ambulatory Visit: Payer: Self-pay | Admitting: Internal Medicine

## 2018-10-17 MED ORDER — VALACYCLOVIR HCL 1 G PO TABS: 2000.0000 mg | ORAL_TABLET | Freq: Two times a day (BID) | ORAL | 1 refills | Status: DC

## 2018-10-20 ENCOUNTER — Other Ambulatory Visit: Payer: Self-pay

## 2018-10-20 DIAGNOSIS — F520 Hypoactive sexual desire disorder: Secondary | ICD-10-CM

## 2018-10-20 DIAGNOSIS — IMO0001 Reserved for inherently not codable concepts without codable children: Secondary | ICD-10-CM

## 2018-10-23 ENCOUNTER — Other Ambulatory Visit: Payer: Self-pay

## 2018-10-23 ENCOUNTER — Other Ambulatory Visit (INDEPENDENT_AMBULATORY_CARE_PROVIDER_SITE_OTHER): Payer: BC Managed Care – PPO

## 2018-10-23 DIAGNOSIS — IMO0001 Reserved for inherently not codable concepts without codable children: Secondary | ICD-10-CM

## 2018-10-23 DIAGNOSIS — F520 Hypoactive sexual desire disorder: Secondary | ICD-10-CM | POA: Diagnosis not present

## 2018-10-24 DIAGNOSIS — L738 Other specified follicular disorders: Secondary | ICD-10-CM | POA: Diagnosis not present

## 2018-10-24 DIAGNOSIS — Z85828 Personal history of other malignant neoplasm of skin: Secondary | ICD-10-CM | POA: Diagnosis not present

## 2018-10-24 DIAGNOSIS — D2262 Melanocytic nevi of left upper limb, including shoulder: Secondary | ICD-10-CM | POA: Diagnosis not present

## 2018-10-24 DIAGNOSIS — D2261 Melanocytic nevi of right upper limb, including shoulder: Secondary | ICD-10-CM | POA: Diagnosis not present

## 2018-10-24 LAB — TESTOSTERONE TOTAL,FREE,BIO, MALES
Albumin: 4.6 g/dL (ref 3.6–5.1)
Sex Hormone Binding: 37 nmol/L (ref 10–50)
Testosterone, Bioavailable: 59.4 ng/dL — ABNORMAL LOW (ref >=110.0)
Testosterone, Free: 28.3 pg/mL — ABNORMAL LOW (ref 46.0–224.0)
Testosterone: 250 ng/dL (ref 250–827)

## 2018-10-28 ENCOUNTER — Other Ambulatory Visit: Payer: BC Managed Care – PPO

## 2018-10-28 ENCOUNTER — Other Ambulatory Visit: Payer: Self-pay

## 2018-10-28 DIAGNOSIS — E785 Hyperlipidemia, unspecified: Secondary | ICD-10-CM | POA: Diagnosis not present

## 2018-10-28 LAB — LIPID PANEL
Chol/HDL Ratio: 2.3 ratio (ref 0.0–5.0)
Cholesterol, Total: 154 mg/dL (ref 100–199)
HDL: 66 mg/dL (ref >39)
LDL Calculated: 61 mg/dL (ref 0–99)
Triglycerides: 133 mg/dL (ref 0–149)
VLDL Cholesterol Cal: 27 mg/dL (ref 5–40)

## 2018-10-28 LAB — HEPATIC FUNCTION PANEL
ALT: 16 IU/L (ref 0–44)
AST: 26 IU/L (ref 0–40)
Albumin: 4.9 g/dL (ref 3.8–4.9)
Alkaline Phosphatase: 54 IU/L (ref 39–117)
Bilirubin Total: 0.8 mg/dL (ref 0.0–1.2)
Bilirubin, Direct: 0.22 mg/dL (ref 0.00–0.40)
Total Protein: 7.1 g/dL (ref 6.0–8.5)

## 2018-11-03 ENCOUNTER — Ambulatory Visit: Payer: BLUE CROSS/BLUE SHIELD | Admitting: Cardiovascular Disease

## 2018-12-04 ENCOUNTER — Encounter: Payer: Self-pay | Admitting: Internal Medicine

## 2019-02-10 DIAGNOSIS — H532 Diplopia: Secondary | ICD-10-CM | POA: Diagnosis not present

## 2019-02-10 DIAGNOSIS — H5500 Unspecified nystagmus: Secondary | ICD-10-CM | POA: Diagnosis not present

## 2019-02-10 DIAGNOSIS — H5319 Other subjective visual disturbances: Secondary | ICD-10-CM | POA: Diagnosis not present

## 2019-02-11 ENCOUNTER — Ambulatory Visit: Payer: BC Managed Care – PPO | Admitting: Internal Medicine

## 2019-02-11 ENCOUNTER — Encounter (HOSPITAL_COMMUNITY): Payer: Self-pay | Admitting: *Deleted

## 2019-02-11 ENCOUNTER — Other Ambulatory Visit: Payer: Self-pay

## 2019-02-11 ENCOUNTER — Encounter: Payer: Self-pay | Admitting: Internal Medicine

## 2019-02-11 ENCOUNTER — Emergency Department (HOSPITAL_COMMUNITY): Payer: BC Managed Care – PPO

## 2019-02-11 ENCOUNTER — Emergency Department (HOSPITAL_COMMUNITY)
Admission: EM | Admit: 2019-02-11 | Discharge: 2019-02-11 | Disposition: A | Discharge status: Home / Self Care | Payer: BC Managed Care – PPO | Attending: Emergency Medicine | Admitting: Emergency Medicine

## 2019-02-11 ENCOUNTER — Ambulatory Visit (INDEPENDENT_AMBULATORY_CARE_PROVIDER_SITE_OTHER): Payer: BC Managed Care – PPO | Admitting: Internal Medicine

## 2019-02-11 DIAGNOSIS — I639 Cerebral infarction, unspecified: Secondary | ICD-10-CM | POA: Diagnosis not present

## 2019-02-11 DIAGNOSIS — R202 Paresthesia of skin: Secondary | ICD-10-CM | POA: Diagnosis not present

## 2019-02-11 DIAGNOSIS — Z8673 Personal history of transient ischemic attack (TIA), and cerebral infarction without residual deficits: Secondary | ICD-10-CM | POA: Insufficient documentation

## 2019-02-11 DIAGNOSIS — I10 Essential (primary) hypertension: Secondary | ICD-10-CM | POA: Insufficient documentation

## 2019-02-11 DIAGNOSIS — F17290 Nicotine dependence, other tobacco product, uncomplicated: Secondary | ICD-10-CM | POA: Diagnosis not present

## 2019-02-11 DIAGNOSIS — I110 Hypertensive heart disease with heart failure: Secondary | ICD-10-CM | POA: Diagnosis not present

## 2019-02-11 DIAGNOSIS — R519 Headache, unspecified: Secondary | ICD-10-CM

## 2019-02-11 DIAGNOSIS — I5020 Unspecified systolic (congestive) heart failure: Secondary | ICD-10-CM | POA: Insufficient documentation

## 2019-02-11 DIAGNOSIS — F411 Generalized anxiety disorder: Secondary | ICD-10-CM

## 2019-02-11 DIAGNOSIS — F439 Reaction to severe stress, unspecified: Secondary | ICD-10-CM | POA: Insufficient documentation

## 2019-02-11 DIAGNOSIS — R29818 Other symptoms and signs involving the nervous system: Secondary | ICD-10-CM | POA: Diagnosis not present

## 2019-02-11 DIAGNOSIS — Z7982 Long term (current) use of aspirin: Secondary | ICD-10-CM | POA: Diagnosis not present

## 2019-02-11 DIAGNOSIS — Z79899 Other long term (current) drug therapy: Secondary | ICD-10-CM | POA: Insufficient documentation

## 2019-02-11 DIAGNOSIS — G43909 Migraine, unspecified, not intractable, without status migrainosus: Secondary | ICD-10-CM | POA: Diagnosis not present

## 2019-02-11 HISTORY — DX: Migraine, unspecified, not intractable, without status migrainosus: G43.909

## 2019-02-11 LAB — PROTIME-INR
INR: 1 (ref 0.8–1.2)
Prothrombin Time: 13.1 seconds (ref 11.4–15.2)

## 2019-02-11 LAB — DIFFERENTIAL
Abs Immature Granulocytes: 0.03 10*3/uL (ref 0.00–0.07)
Basophils Absolute: 0.1 10*3/uL (ref 0.0–0.1)
Basophils Relative: 1 %
Eosinophils Absolute: 0.2 10*3/uL (ref 0.0–0.5)
Eosinophils Relative: 2 %
Immature Granulocytes: 0 %
Lymphocytes Relative: 22 %
Lymphs Abs: 1.9 10*3/uL (ref 0.7–4.0)
Monocytes Absolute: 0.9 10*3/uL (ref 0.1–1.0)
Monocytes Relative: 10 %
Neutro Abs: 5.7 10*3/uL (ref 1.7–7.7)
Neutrophils Relative %: 65 %

## 2019-02-11 LAB — COMPREHENSIVE METABOLIC PANEL
ALT: 16 U/L (ref 0–44)
AST: 23 U/L (ref 15–41)
Albumin: 4.3 g/dL (ref 3.5–5.0)
Alkaline Phosphatase: 42 U/L (ref 38–126)
Anion gap: 12 (ref 5–15)
BUN: 15 mg/dL (ref 6–20)
CO2: 24 mmol/L (ref 22–32)
Calcium: 9.6 mg/dL (ref 8.9–10.3)
Chloride: 99 mmol/L (ref 98–111)
Creatinine, Ser: 1.09 mg/dL (ref 0.61–1.24)
GFR calc Af Amer: >60 mL/min (ref >60)
GFR calc non Af Amer: >60 mL/min (ref >60)
Glucose, Bld: 104 mg/dL — ABNORMAL HIGH (ref 70–99)
Potassium: 4.2 mmol/L (ref 3.5–5.1)
Sodium: 135 mmol/L (ref 135–145)
Total Bilirubin: 1.2 mg/dL (ref 0.3–1.2)
Total Protein: 7.4 g/dL (ref 6.5–8.1)

## 2019-02-11 LAB — I-STAT CHEM 8, ED
BUN: 17 mg/dL (ref 6–20)
Calcium, Ion: 1.21 mmol/L (ref 1.15–1.40)
Chloride: 99 mmol/L (ref 98–111)
Creatinine, Ser: 1 mg/dL (ref 0.61–1.24)
Glucose, Bld: 93 mg/dL (ref 70–99)
HCT: 44 % (ref 39.0–52.0)
Hemoglobin: 15 g/dL (ref 13.0–17.0)
Potassium: 4.1 mmol/L (ref 3.5–5.1)
Sodium: 137 mmol/L (ref 135–145)
TCO2: 30 mmol/L (ref 22–32)

## 2019-02-11 LAB — CBC
HCT: 45.8 % (ref 39.0–52.0)
Hemoglobin: 15.2 g/dL (ref 13.0–17.0)
MCH: 30.5 pg (ref 26.0–34.0)
MCHC: 33.2 g/dL (ref 30.0–36.0)
MCV: 91.8 fL (ref 80.0–100.0)
Platelets: 308 10*3/uL (ref 150–400)
RBC: 4.99 MIL/uL (ref 4.22–5.81)
RDW: 12.4 % (ref 11.5–15.5)
WBC: 8.9 10*3/uL (ref 4.0–10.5)
nRBC: 0 % (ref 0.0–0.2)

## 2019-02-11 LAB — APTT: aPTT: 27 seconds (ref 24–36)

## 2019-02-11 LAB — GLUCOSE, CAPILLARY: Glucose-Capillary: 105 mg/dL — ABNORMAL HIGH (ref 70–99)

## 2019-02-11 MED ORDER — SODIUM CHLORIDE 0.9% FLUSH
3.0000 mL | Freq: Once | INTRAVENOUS | Status: AC
  Administered 2019-02-11: 3 mL via INTRAVENOUS

## 2019-02-11 MED ORDER — ALPRAZOLAM 0.25 MG PO TABS: 0.2500 mg | ORAL_TABLET | Freq: Two times a day (BID) | ORAL | 0 refills | Status: DC | PRN

## 2019-02-11 NOTE — ED Triage Notes (Addendum)
Pt  States L sided headache and L "eye spasms" x 4 days.  Today he started having a R frontal headache and now is having R arm numbness and tingling.  No other deficits noted.  States hx of stroke 7 months ago.

## 2019-02-11 NOTE — ED Notes (Signed)
Spoke with Dr Kathrynn Humble.  He stated to call a code stroke.

## 2019-02-11 NOTE — Discharge Instructions (Signed)
The testing today did not show a stroke or other acute medical conditions.  Your symptoms may be only related to stress.  Make sure you are getting plenty of rest, eating well and getting some exercise daily.  Consider seeing her primary care doctor for further treatment, or to arrange counseling or other forms of stress management.  Return here, if needed, for problems.

## 2019-02-11 NOTE — Progress Notes (Signed)
Subjective:    Patient ID: Keith Pile., male    DOB: 04-04-1965, 53 y.o.   MRN: [id number]  DOS:  02/11/2019 Type of visit - description: Virtual Visit via Video Note  I connected with@   by a video enabled telemedicine application and verified that I am speaking with the correct person using two identifiers.   THIS ENCOUNTER IS A VIRTUAL VISIT DUE TO COVID-19 - PATIENT WAS NOT SEEN IN THE OFFICE. PATIENT HAS CONSENTED TO VIRTUAL VISIT / TELEMEDICINE VISIT   Location of patient: home  Location of provider: office  I discussed the limitations of evaluation and management by telemedicine and the availability of in person appointments. The patient expressed understanding and agreed to proceed.  History of Present Illness:   Acute The patient is concerned because he has left eye twitching and a headache for the last 5 days. Went to see his optometrist and he recommend immediate neurology evaluation. The patient denies blepharospasm, reports the "eyeball is twitching". The headache is not the worst of his life, can be left or right, no associated nausea, vomiting, or light intolerance. He has an appointment to see neurology in 2 days but wonders if he needs to do something sooner.  Medications list reviewed, no taking Eliquis  Anxiety: Admits that he has been quite distressed lately, request a refill on Xanax.   Review of Systems Denies slurred speech, facial numbness. No chest pain, difficulty breathing, palpitations Ambulatory BPs typically good, today 122/85.  Past Medical History:  Diagnosis Date  . Allergy   . CVA (cerebrovascular accident) ([place]) 04/08/2018  . GERD (gastroesophageal reflux disease)   . High cholesterol   . History of chicken pox   . Hypertension   . LV (left ventricular) mural thrombus without MI ([place])   . Migraine   . Ocular migraine     Past Surgical History:  Procedure Laterality Date  . HERNIA REPAIR Bilateral   . LEFT HEART CATH AND  CORONARY ANGIOGRAPHY N/A 04/10/2018   Procedure: LEFT HEART CATH AND CORONARY ANGIOGRAPHY;  Surgeon: Lorretta Harp, MD;  Location: [facility];  Service: Cardiovascular;  Laterality: N/A;    Social History   Socioeconomic History  . Marital status: Married    Spouse name: Not on file  . Number of children: 0  . Years of education: Not on file  . Highest education level: Not on file  Occupational History  . 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[employer]  . Financial resource strain: Not on file  . Food insecurity    Worry: Not on file    Inability: Not on file  . Transportation needs    Medical: Not on file    Non-medical: Not on file  Tobacco Use  . Smoking status: Never Smoker  . Smokeless tobacco: Current User    Types: Snuff  Substance and Sexual Activity  . Alcohol use: Yes    Alcohol/week: 25.0 standard drinks    Types: 25 Glasses of wine per week  . Drug use: Never  . Sexual activity: Not on file  Lifestyle  . Physical activity    Days per week: Not on file    Minutes per session: Not on file  . Stress: Not on file  Relationships  . Social Herbalist on phone: Not on file    Gets together: Not on file    Attends religious service: Not on file  Active member of club or organization: Not on file    Attends meetings of clubs or organizations: Not on file    Relationship status: Not on file  . Intimate partner violence    Fear of current or ex partner: Not on file    Emotionally abused: Not on file    Physically abused: Not on file    Forced sexual activity: Not on file  Other Topics Concern  . Not on file  Social History Narrative   Lives w/ wife in [place], moved to  [facility] 2019      Allergies as of 02/11/2019   No Known Allergies     Medication List       Accurate as of February 11, 2019  8:23 PM. If you have any questions, ask your nurse or doctor.        STOP taking these medications    apixaban 5 MG Tabs tablet Commonly known as: Eliquis Stopped by: Kathlene November, MD     TAKE these medications   acetaminophen 325 MG tablet Commonly known as: TYLENOL Take 650 mg by mouth as needed for pain.   ALPRAZolam 0.25 MG tablet Commonly known as: XANAX Take 1 tablet (0.25 mg total) by mouth 2 (two) times daily as needed for anxiety.   aspirin EC 81 MG tablet Take 1 tablet (81 mg total) by mouth daily.   atorvastatin 40 MG tablet Commonly known as: LIPITOR Take 1 tablet (40 mg total) by mouth daily.   cetirizine 10 MG tablet Commonly known as: ZYRTEC Take 10 mg by mouth daily.   [employer] VIT D 5000 HIGH-POTENCY PO Take 1 capsule by mouth daily.   losartan 50 MG tablet Commonly known as: COZAAR Take 50 mg by mouth daily.   metoprolol tartrate 25 MG tablet Commonly known as: LOPRESSOR Take 1 tablet (25 mg total) by mouth 2 (two) times daily. What changed: when to take this   nitroGLYCERIN 0.4 MG SL tablet Commonly known as: NITROSTAT Place 1 tablet under the tongue every 5 (five) minutes as needed for chest pain.   pantoprazole 40 MG tablet Commonly known as: PROTONIX Take 1 tablet (40 mg total) by mouth daily.   valACYclovir 1000 MG tablet Commonly known as: Valtrex Take 2 tablets (2,000 mg total) by mouth 2 (two) times daily. Use for 1 day only for each cold sore outbrake What changed:   when to take this  reasons to take this  additional instructions           Objective:   Physical Exam There were no vitals taken for this visit. This is a virtual video visit, he is alert oriented x3, speech is normal, face symmetric.  Upper extremity and torso moves normal as well.  She does look quite anxious.    Assessment     Assessment (new patient, referred by Dr. Johnsie Cancel) HTN  GERD Hyperlipidemia Asthma? Allergies?  CV: -CVA acute 04-2018 -CAD, DX 04-2018, CABG 05-2018 at [facility] -Heart failure, DX 04-2018 in the context of a stroke, EF 30%. Vitamin D  deficiency [facility] ?Ocular migraine once 11/2017 Moved to [place]  2019.   PLAN Migraine: 5 days history of a steady / mild headache, not worst of life, associated with left eyeball "twitching". I agree that he needs further evaluation, will see neurology in 2 days, pt wonders if something needs to be done sooner. He understands the limitation of a virtual visit, at this point he is not having strokelike symptoms however if  anything changes or get worse he needs to go to the ER even before the neurology visit. He is quite anxious about this issue and and this is a virtual visit consequently if he feels that he cannot wait to see neurology, then he should go to the emergency room. HTN: Currently on metoprolol, BP today 122/85. Anxiety: Refill Xanax CAD: Asymptomatic RTC: Due for visit within 2 months, will call on a schedule       I discussed the assessment and treatment plan with the patient. The patient was provided an opportunity to ask questions and all were answered. The patient agreed with the plan and demonstrated an understanding of the instructions.   The patient was advised to call back or seek an in-person evaluation if the symptoms worsen or if the condition fails to improve as anticipated.

## 2019-02-11 NOTE — Assessment & Plan Note (Signed)
Migraine: 5 days history of a steady / mild headache, not worst of life, associated with left eyeball "twitching". I agree that he needs further evaluation, will see neurology in 2 days, pt wonders if something needs to be done sooner. He understands the limitation of a virtual visit, at this point he is not having strokelike symptoms however if anything changes or get worse he needs to go to the ER even before the neurology visit. He is quite anxious about this issue and and this is a virtual visit consequently if he feels that he cannot wait to see neurology, then he should go to the emergency room. HTN: Currently on metoprolol, BP today 122/85. Anxiety: Refill Xanax CAD: Asymptomatic RTC: Due for visit within 2 months, will call on a schedule

## 2019-02-11 NOTE — ED Provider Notes (Signed)
[facility] EMERGENCY DEPARTMENT Provider Note   CSN: [id number] Arrival date & time: 02/11/19  1213     History   Chief Complaint Chief Complaint  Patient presents with   Headache   Numbness    HPI Keith Black. is a 53 y.o. male.     HPI   Patient presents for evaluation of "spasms of the left eye."  He describes this as a sensation that the globe of the left eye is getting bigger and smaller.  His wife states that it "looks like the eye is twitching."  Also he has had intermittent "migraine headaches, first left-sided, now right-sided starting today.  Headaches have been intermittent for several days.  Today when he noticed the right sided headache, he felt numbness in his right hand.  He also states that he "felt a little off balance today."  He denies chest pain, shortness of breath, fever, chills, nausea, vomiting, focal weakness or other areas of numbness.  Yesterday his cardiologist started him on losartan, 50 mg, to treat some high blood pressure, that he discovered while taking his blood pressure at home.  He admits to being under a lot of stress, preceding onset of all the symptoms.  He relates this stress as being from "work and things at home."  He is concerned about the sensations today because he had a stroke, about 9 months ago, that was manifested as numbness of his right face and right hand.  Also at that time he had a coronary artery bypass graft, that was required because of disease found during work-up for the stroke.  He reports that he was told that he had an embolic stroke from the heart.  He was transiently anticoagulated for a month after the CABG but is not on long-term anticoagulation.  He saw neurology and had additional testing including what sounds like EEG, but no other interventions.  He has continued to have intermittent migraine headache, since the stroke and CABG.  No known sick contacts.  There are no other known modifying  factors.  Of note: None the patient saw his PCP today, by virtual visit, and neglected to offer this information.  Past Medical History:  Diagnosis Date   Allergy    CVA (cerebrovascular accident) ([place]) 04/08/2018   GERD (gastroesophageal reflux disease)    High cholesterol    History of chicken pox    Hypertension    LV (left ventricular) mural thrombus without MI ([place])    Migraine    Ocular migraine     Patient Active Problem List   Diagnosis Date Noted   PCP NOTES >>>>>>>>>> 04/27/2018   Squamous cell carcinoma of back 04/25/2018   Basal cell carcinoma (BCC) of anterior chest 04/25/2018   CVA (cerebral vascular accident) ([place]) 04/09/2018   LV (left ventricular) mural thrombus without MI ([place]) 04/09/2018   Hyperlipidemia 04/09/2018   Essential hypertension [id number]   Systolic dysfunction with heart failure ([place]) 04/09/2018   Cough variant asthma 11/13/2017   Vitamin D deficiency 07/02/2012   Herpes simplex virus (HSV) infection 02/11/2012   Esophageal reflux 03/15/2011   Anxiety state 09/08/2009    Past Surgical History:  Procedure Laterality Date   HERNIA REPAIR Bilateral    LEFT HEART CATH AND CORONARY ANGIOGRAPHY N/A 04/10/2018   Procedure: LEFT HEART CATH AND CORONARY ANGIOGRAPHY;  Surgeon: Lorretta Harp, MD;  Location: [place] [facility];  Service: Cardiovascular;  Laterality: N/A;        Home  Medications    Prior to Admission medications   Medication Sig Start Date End Date Taking? Authorizing Provider  acetaminophen (TYLENOL) 325 MG tablet Take 650 mg by mouth as needed for pain.   Yes [provider]  ALPRAZolam (XANAX) 0.25 MG tablet Take 1 tablet (0.25 mg total) by mouth 2 (two) times daily as needed for anxiety. 02/11/19  Yes Colon Branch, MD  aspirin EC 81 MG tablet Take 1 tablet (81 mg total) by mouth daily. 07/04/18  Yes Paz, Alda Berthold, MD  atorvastatin (LIPITOR) 40 MG tablet Take 1 tablet (40 mg total) by mouth daily.  07/14/18  Yes Josue Hector, MD  cetirizine (ZYRTEC) 10 MG tablet Take 10 mg by mouth daily.   Yes [provider]  Cholecalciferol ([employer] VIT D 5000 HIGH-POTENCY PO) Take 1 capsule by mouth daily.   Yes [provider]  losartan (COZAAR) 50 MG tablet Take 50 mg by mouth daily.   Yes [provider]  metoprolol tartrate (LOPRESSOR) 25 MG tablet Take 1 tablet (25 mg total) by mouth 2 (two) times daily. Patient taking differently: Take 25 mg by mouth every evening.  07/21/18  Yes Josue Hector, MD  nitroGLYCERIN (NITROSTAT) 0.4 MG SL tablet Place 1 tablet under the tongue every 5 (five) minutes as needed for chest pain. 09/04/18 09/04/19 Yes [provider]  pantoprazole (PROTONIX) 40 MG tablet Take 1 tablet (40 mg total) by mouth daily. 07/29/18  Yes Paz, Alda Berthold, MD  valACYclovir (VALTREX) 1000 MG tablet Take 2 tablets (2,000 mg total) by mouth 2 (two) times daily. Use for 1 day only for each cold sore outbrake Patient taking differently: Take 2,000 mg by mouth 2 (two) times daily as needed (outbreaks).  10/17/18  Yes Colon Branch, MD    Family History Family History  Problem Relation Age of Onset   Allergies Mother    Asthma Mother    Heart disease Mother 87       had stents and then CABG   Stroke Father 65   Colon cancer Neg Hx    Prostate cancer Neg Hx     Social History Social History   Tobacco Use   Smoking status: Never Smoker   Smokeless tobacco: Current User    Types: Snuff  Substance Use Topics   Alcohol use: Yes    Alcohol/week: 25.0 standard drinks    Types: 25 Glasses of wine per week   Drug use: Never     Allergies   Patient has no known allergies.   Review of Systems Review of Systems  All other systems reviewed and are negative.    Physical Exam Updated Vital Signs BP 122/77    Pulse 87    Temp 98.2 F (36.8 C)    Resp 15    Ht 5\' 8"  (1.727 m)    Wt 67.1 kg    SpO2 100%    BMI 22.50 kg/m   Physical  Exam Vitals signs and nursing note reviewed.  Constitutional:      General: He is not in acute distress.    Appearance: He is well-developed. He is not ill-appearing, toxic-appearing or diaphoretic.  HENT:     Head: Normocephalic and atraumatic.     Right Ear: External ear normal.     Left Ear: External ear normal.  Eyes:     Conjunctiva/sclera: Conjunctivae normal.     Pupils: Pupils are equal, round, and reactive to light.  Neck:  Musculoskeletal: Normal range of motion and neck supple.     Trachea: Phonation normal.  Cardiovascular:     Rate and Rhythm: Normal rate and regular rhythm.     Heart sounds: Normal heart sounds.  Pulmonary:     Effort: Pulmonary effort is normal.     Breath sounds: Normal breath sounds.  Abdominal:     General: There is no distension.     Palpations: Abdomen is soft.     Tenderness: There is no abdominal tenderness.  Musculoskeletal: Normal range of motion.  Skin:    General: Skin is warm and dry.  Neurological:     Mental Status: He is alert and oriented to person, place, and time.     Cranial Nerves: No cranial nerve deficit.     Sensory: No sensory deficit.     Motor: No abnormal muscle tone.     Coordination: Coordination normal.     Comments: No dysarthria, ataxia or aphasia.  No pronator drift.  No nystagmus.  At the time of examination he has no reproducible dysesthesia.  Psychiatric:        Mood and Affect: Mood is anxious. Mood is not depressed.        Behavior: Behavior normal. Behavior is not agitated.        Thought Content: Thought content normal.        Cognition and Memory: Cognition is not impaired.        Judgment: Judgment normal.      ED Treatments / Results  Labs (all labs ordered are listed, but only abnormal results are displayed) Labs Reviewed  COMPREHENSIVE METABOLIC PANEL - Abnormal; Notable for the following components:      Result Value   Glucose, Bld 104 (*)    All other components within normal limits   PROTIME-INR  APTT  CBC  DIFFERENTIAL  I-STAT CHEM 8, ED  CBG MONITORING, ED    EKG EKG Interpretation  Date/Time:  Wednesday February 11 2019 12:59:08 EST Ventricular Rate:  94 PR Interval:    QRS Duration: 76 QT Interval:  328 QTC Calculation: 411 R Axis:   68 Text Interpretation: Sinus rhythm Baseline wander in lead(s) V2 since last tracing no significant change Confirmed by Daleen Bo [phone]) on 02/11/2019 1:20:22 PM   Radiology Mr Angio Head Wo Contrast  Result Date: 02/11/2019 CLINICAL DATA:  Left-sided migraine for 4 days. Left eyelid twitching. Right-sided headache today with right upper extremity tingling. EXAM: MRI HEAD WITHOUT CONTRAST MRA HEAD WITHOUT CONTRAST TECHNIQUE: Multiplanar, multiecho pulse sequences of the brain and surrounding structures were obtained without intravenous contrast. Angiographic images of the head were obtained using MRA technique without contrast. COMPARISON:  Head CT 02/11/2019, head MRI 04/30/2018, and head CTA 04/30/2018 FINDINGS: MRI HEAD FINDINGS Brain: There is no evidence of acute infarct, intracranial hemorrhage, mass, midline shift, or extra-axial fluid collection. Small region of left medial orbitofrontal encephalomalacia is unchanged from the prior MRI. There is minimal gliosis in the posterolateral left frontal lobe at the site of the acute infarct on the 04/08/2018 MRI. The brain is normal in signal elsewhere. The ventricles are normal in size. Vascular: Major intracranial vascular flow voids are preserved. Skull and upper cervical spine: Unremarkable bone marrow signal. Sinuses/Orbits: Unremarkable orbits. Paranasal sinuses and mastoid air cells are clear. Other: None. MRA HEAD FINDINGS The visualized distal vertebral arteries are patent to the basilar with the left being strongly dominant. Patent left PICA, bilateral AICA, and bilateral SCA origins are identified. The  basilar artery is widely patent. There is a large right posterior  communicating artery with mildly hypoplastic left P1 segment. Both PCAs are patent without evidence of significant proximal stenosis. The internal carotid arteries are widely patent from skull base to carotid termini. ACAs and MCAs are patent without evidence of proximal branch occlusion or significant proximal stenosis. No aneurysm is identified. IMPRESSION: 1. No acute intracranial abnormality. 2. Small region of left orbitofrontal encephalomalacia likely reflecting remote trauma. 3. Negative head MRA. Electronically Signed   By: Logan Bores M.D.   On: 02/11/2019 16:09   Mr Brain Wo Contrast  Result Date: 02/11/2019 CLINICAL DATA:  Left-sided migraine for 4 days. Left eyelid twitching. Right-sided headache today with right upper extremity tingling. EXAM: MRI HEAD WITHOUT CONTRAST MRA HEAD WITHOUT CONTRAST TECHNIQUE: Multiplanar, multiecho pulse sequences of the brain and surrounding structures were obtained without intravenous contrast. Angiographic images of the head were obtained using MRA technique without contrast. COMPARISON:  Head CT 02/11/2019, head MRI 04/30/2018, and head CTA 04/30/2018 FINDINGS: MRI HEAD FINDINGS Brain: There is no evidence of acute infarct, intracranial hemorrhage, mass, midline shift, or extra-axial fluid collection. Small region of left medial orbitofrontal encephalomalacia is unchanged from the prior MRI. There is minimal gliosis in the posterolateral left frontal lobe at the site of the acute infarct on the 04/08/2018 MRI. The brain is normal in signal elsewhere. The ventricles are normal in size. Vascular: Major intracranial vascular flow voids are preserved. Skull and upper cervical spine: Unremarkable bone marrow signal. Sinuses/Orbits: Unremarkable orbits. Paranasal sinuses and mastoid air cells are clear. Other: None. MRA HEAD FINDINGS The visualized distal vertebral arteries are patent to the basilar with the left being strongly dominant. Patent left PICA, bilateral AICA,  and bilateral SCA origins are identified. The basilar artery is widely patent. There is a large right posterior communicating artery with mildly hypoplastic left P1 segment. Both PCAs are patent without evidence of significant proximal stenosis. The internal carotid arteries are widely patent from skull base to carotid termini. ACAs and MCAs are patent without evidence of proximal branch occlusion or significant proximal stenosis. No aneurysm is identified. IMPRESSION: 1. No acute intracranial abnormality. 2. Small region of left orbitofrontal encephalomalacia likely reflecting remote trauma. 3. Negative head MRA. Electronically Signed   By: Logan Bores M.D.   On: 02/11/2019 16:09   Ct Head Code Stroke Wo Contrast  Result Date: 02/11/2019 CLINICAL DATA:  Code stroke. Focal neuro deficit, less than 6 hours, stroke suspected. Additional history provided: Left-sided headache and left eye "spasms" for 4 days. Right frontal headache began today now with right arm numbness and tingling. Reported stroke 7 months ago EXAM: CT HEAD WITHOUT CONTRAST TECHNIQUE: Contiguous axial images were obtained from the base of the skull through the vertex without intravenous contrast. COMPARISON:  Brain MRI 04/30/2018, head CT 04/30/2018 FINDINGS: Brain: No evidence of acute intracranial hemorrhage. No acute demarcated cortical infarction. Redemonstrated small focus of chronic encephalomalacia within the anteroinferior left frontal lobe. No evidence of intracranial mass. No midline shift or extra-axial fluid collection. Cerebral volume is normal for age. Vascular: No hyperdense vessel.  Atherosclerotic calcifications. Skull: Normal. Negative for fracture or focal lesion. Sinuses/Orbits: Visualized orbits demonstrate no acute abnormality. Minimal ethmoid sinus mucosal thickening. No significant mastoid effusion. These results were communicated to Dr. Cheral Marker At 12:49 pmon 12/9/2020by text page via the [facility] messaging system.  IMPRESSION: 1. No CT evidence of acute intracranial abnormality. 2. Redemonstrated small chronic focus of encephalomalacia within the anteroinferior left  frontal lobe. Electronically Signed   By: Kellie Simmering DO   On: 02/11/2019 12:52    Procedures .Critical Care Performed by: Daleen Bo, MD Authorized by: Daleen Bo, MD   Critical care provider statement:    Critical care time (minutes):  35   Critical care start time:  02/11/2019 1:10 PM   Critical care end time:  02/11/2019 4:40 PM   Critical care time was exclusive of:  Separately billable procedures and treating other patients   Critical care was necessary to treat or prevent imminent or life-threatening deterioration of the following conditions:  CNS failure or compromise   Critical care was time spent personally by me on the following activities:  Blood draw for specimens, development of treatment plan with patient or surrogate, discussions with consultants, evaluation of patient's response to treatment, examination of patient, obtaining history from patient or surrogate, ordering and performing treatments and interventions, ordering and review of laboratory studies, pulse oximetry, re-evaluation of patient's condition, review of old charts and ordering and review of radiographic studies   (including critical care time)  Medications Ordered in ED Medications  sodium chloride flush (NS) 0.9 % injection 3 mL (3 mLs Intravenous Given by Other 02/11/19 1258)     Initial Impression / Assessment and Plan / ED Course  I have reviewed the triage vital signs and the nursing notes.  Pertinent labs & imaging results that were available during my care of the patient were reviewed by me and considered in my medical decision making (see chart for details).  Clinical Course as of Feb 11 1639  Wed Feb 11, 2019  1636 Per radiology, negative brain MRI and head MRA.   [EW]  1636 Normal  I-stat chem 8, ED [EW]  1636 Normal  Differential  [EW]  [id number] Normal  Comprehensive metabolic panel(!) [EW]  [id number] Per radiology no acute abnormality.  CT HEAD CODE STROKE WO CONTRAST [EW]    Clinical Course User Index [EW] Daleen Bo, MD        Patient Vitals for the past 24 hrs:  BP Temp Pulse Resp SpO2 Height Weight  02/11/19 1400 122/77 -- 87 15 100 % -- --  02/11/19 1345 121/74 -- -- 15 -- -- --  02/11/19 1330 (!) 145/81 -- -- 13 -- -- --  02/11/19 1315 124/78 -- -- 19 -- -- --  02/11/19 1300 (!) 165/87 -- -- 19 -- -- --  02/11/19 1222 -- -- -- -- -- 5\' 8"  (1.727 m) 67.1 kg  02/11/19 1219 (!) 169/107 98.2 F (36.8 C) (!) 106 16 100 % -- --    4:31 PM Reevaluation with update and discussion. After initial assessment and treatment, an updated evaluation reveals he is alert, and comfortable.  After discussing findings with him, he requested "a sedative for stress."  I explained that his PCP would be the best person to see about stress management and specific treatments for it.  All questions were answered. Daleen Bo   Medical Decision Making: Nonspecific complaints, with headache and transient right arm numbness.  History of CVA, and CABG.  Patient seen by neurology as well as myself.  MRI brain and MRA head, were negative.  No indication for further ED treatment or intervention.  Patient very anxious during examination.  I suspect that stress, and/or anxiety the primary drivers for his discomfort.  Keith Cork Charlaine Dalton. was evaluated in Emergency Department on 02/11/2019 for the symptoms described in the history of present illness. He was evaluated  in the context of the global COVID-19 pandemic, which necessitated consideration that the patient might be at risk for infection with the SARS-CoV-2 virus that causes COVID-19. Institutional protocols and algorithms that pertain to the evaluation of patients at risk for COVID-19 are in a state of rapid change based on information released by regulatory bodies including the CDC and federal  and state organizations. These policies and algorithms were followed during the patient's care in the ED.  CRITICAL CARE- YES Performed by: Daleen Bo  Nursing Notes Reviewed/ Care Coordinated Applicable Imaging Reviewed Interpretation of Laboratory Data incorporated into ED treatment  The patient appears reasonably screened and/or stabilized for discharge and I doubt any other medical condition or other [facility] requiring further screening, evaluation, or treatment in the ED at this time prior to discharge.  Plan: Home Medications-usual; Home Treatments-rest, stress management; return here if the recommended treatment, does not improve the symptoms; Recommended follow up-PCP follow-up for further treatment as needed.   Final Clinical Impressions(s) / ED Diagnoses   Final diagnoses:  Nonintractable headache, unspecified chronicity pattern, unspecified headache type  Paresthesia  Stress    ED Discharge Orders    None       Daleen Bo, MD 02/11/19 1641

## 2019-02-11 NOTE — ED Notes (Signed)
Pt to CT

## 2019-02-11 NOTE — Code Documentation (Signed)
53yo male arriving to [facility] via private vehicle at 1213. Patient with history of headache and left eye spasms for several days and had sudden onset right hand numbness today at 1100 that progressed to his right forearm. Headache worsening today per patient. Patient reports increased stress. Patient with history of stroke and migraines. Code stroke activated in the ED. Stroke team to the bedside in CT. NIHSS 0, see documentation for details and code stroke times. Patient reports subjective right hand numbness on exam. No acute stroke treatment at this time. Code stroke canceled. Bedside handoff with ED RN Maudie Mercury.

## 2019-02-11 NOTE — Consult Note (Addendum)
Neurology Consultation  Reason for Consult: Code stroke  Referring Physician: Dr. Eulis Foster  CC: Headache/right arm tingling/blepharospasm on left eye  History is obtained from: Patient  HPI: Keith Black. is a 53 y.o. male presenting with a chief complaint of headache, right arm tingling and twitching of his left eye. He has a significant PMHx including CAD s/p CABG at [facility] in March of this year, CHF, ocular migraines, left ventricular mural thrombus diagnosed after a 12 mm stroke within the left lateral precentral gyrus in February 2020, hypertension and hypercholesterolemia.  He has been on anticoagulation with Eliquis for secondary prevention of stroke and other embolic complications from his known ventricular mural thrombus, in conjunction with ASA for his CAD.   Patient has been suffering migraine headache for the past 4 days on the left side of his head.  He also noted that he was having twitching of his left eye.  Patient went to an ophthalmologist yesterday who told him his vision was fine; however, due to the ocular movements consisting of twitching of his left eye it was felt that he should see a neurologist. Today patient noted that he was having a right-sided headache but also had right-sided tingling from his elbow to his hand. Eye twitching continued to be present. He was worried that he may be having another stroke and for that reason the patient came to the emergency department.  While in triage, due to his symptoms a code stroke was called.  Upon initial consultation with patient his main complaint is right-sided headache, subjective right hand numbness and twitching of his left eye.  Patient states that he has had migraines before, however he has never had neurological symptoms with his migraines.  Patient does admit he has been under a lot of stress lately at work.  Plan per his PCP from initial visit today included the following: "Migraine: 5 days history of a steady / mild  headache, not worst of life, associated with left eyeball "twitching". I agree that he needs further evaluation, will see neurology in 2 days, pt wonders if something needs to be done sooner."  ED course CT head   Chart review patient was seen previously on 04/08/2018 at that time for left frontal embolic infarct presumably from a left ventricular apical mural thrombus.  He was placed on anticoagulation with Eliquis.  At that time he was noted to have left ventricular reduced systolic function with an EF of 35-40%.  Patient at that time underwent a left heart catheterization showing 80% stenosis of the proximal LAD and 100% stenosis of the proximal LAD-2.  LKW: 11:00 this morning tpa given?: no, NIH stroke scale of 0 Premorbid modified Rankin scale (mRS): 0 NIH stroke score of 0   Past Medical History:  Diagnosis Date  . Allergy   . CVA (cerebrovascular accident) ([place]) 04/08/2018  . GERD (gastroesophageal reflux disease)   . High cholesterol   . History of chicken pox   . Hypertension   . LV (left ventricular) mural thrombus without MI ([place])   . Migraine   . Ocular migraine     Family History  Problem Relation Age of Onset  . Allergies Mother   . Asthma Mother   . Heart disease Mother 43       had stents and then CABG  . Stroke Father 11  . Colon cancer Neg Hx   . Prostate cancer Neg Hx    Social History:   reports that he has  never smoked. His smokeless tobacco use includes snuff. He reports current alcohol use of about 25.0 standard drinks of alcohol per week. He reports that he does not use drugs.  Medications No current facility-administered medications for this encounter.   Current Outpatient Medications:  .  acetaminophen (TYLENOL) 325 MG tablet, Take 650 mg by mouth as needed for pain., Disp: , Rfl:  .  ALPRAZolam (XANAX) 0.25 MG tablet, Take 1 tablet (0.25 mg total) by mouth 2 (two) times daily as needed for anxiety., Disp: 60 tablet, Rfl: 0 .  aspirin EC 81 MG  tablet, Take 1 tablet (81 mg total) by mouth daily., Disp: 90 tablet, Rfl: 3 .  atorvastatin (LIPITOR) 40 MG tablet, Take 1 tablet (40 mg total) by mouth daily., Disp: 90 tablet, Rfl: 3 .  cetirizine (ZYRTEC) 10 MG tablet, Take 10 mg by mouth daily., Disp: , Rfl:  .  Cholecalciferol (CVS VIT D 5000 HIGH-POTENCY PO), Take 1 capsule by mouth daily., Disp: , Rfl:  .  metoprolol tartrate (LOPRESSOR) 25 MG tablet, Take 1 tablet (25 mg total) by mouth 2 (two) times daily., Disp: 180 tablet, Rfl: 3 .  pantoprazole (PROTONIX) 40 MG tablet, Take 1 tablet (40 mg total) by mouth daily., Disp: 90 tablet, Rfl: 3 .  valACYclovir (VALTREX) 1000 MG tablet, Take 2 tablets (2,000 mg total) by mouth 2 (two) times daily. Use for 1 day only for each cold sore outbrake, Disp: 30 tablet, Rfl: 1   Exam: Current vital signs: BP (!) 169/107 (BP Location: Left Arm)   Pulse (!) 106   Temp 98.2 F (36.8 C)   Resp 16   Ht 5\' 8"  (1.727 m)   Wt 67.1 kg   SpO2 100%   BMI 22.50 kg/m  Vital signs in last 24 hours: Temp:  [98.2 F (36.8 C)] 98.2 F (36.8 C) (12/09 1219) Pulse Rate:  [106] 106 (12/09 1219) Resp:  [16] 16 (12/09 1219) BP: (169)/(107) 169/107 (12/09 1219) SpO2:  [100 %] 100 % (12/09 1219) Weight:  [67.1 kg] 67.1 kg (12/09 1222)  ROS:   General ROS: Positive for -headache Psychological ROS: negative for - behavioral disorder, hallucinations, memory difficulties, mood swings or suicidal ideation Ophthalmic ROS: Positive for -twitching of left eye ENT ROS: Positive for -intermittent vertigo Respiratory ROS: negative for - cough, hemoptysis, shortness of breath or wheezing Cardiovascular ROS: negative for - chest pain, dyspnea on exertion, edema or irregular heartbeat Gastrointestinal ROS: negative for - abdominal pain, diarrhea, hematemesis, nausea/vomiting or stool incontinence Genito-Urinary ROS: negative for - dysuria, hematuria, incontinence or urinary frequency/urgency Musculoskeletal ROS:  negative for - joint swelling or muscular weakness Neurological ROS: as noted in HPI Dermatological ROS: negative for rash and skin lesion changes   Physical Exam   Constitutional: Appears well-developed and well-nourished.  Psych: Affect appropriate to situation Eyes: No scleral injection HENT: No OP obstrucion Head: Normocephalic.  Cardiovascular: Normal rate and regular rhythm.  Respiratory: Effort normal, non-labored breathing GI: Soft.  No distension. There is no tenderness.  Skin: WDI  Neuro: Mental Status: Patient is awake, alert, oriented to person, place, month, year, and situation. Patient is able to give a clear and coherent history. No signs of aphasia or neglect Cranial Nerves: II: Visual Fields are full.  III,IV, VI: EOMI without ptosis or diplopia. Pupils equal, round and reactive to light.  It is notable that he has bilateral low amplitude saccades conjugately in different directions that becomes worse with increased anxiety; this is seen in  addition to left eyelid twitching that is low amplitude and relatively subtle.  V: Facial sensation is symmetric to temperature VII: Facial movement is symmetric.  VIII: hearing is intact to voice X: Palate elevates symmetrically XI: Shoulder shrug is symmetric. XII: tongue is midline without atrophy or fasciculations.  Motor: Tone is normal. Bulk is normal. 5/5 strength was present in all four extremities.  Sensory: Sensation is symmetric to light touch and temperature in the arms and legs. Deep Tendon Reflexes: 2+ and symmetric in the biceps and patellae.  Plantars: Toes are downgoing bilaterally.  Cerebellar: FNF and HKS are intact bilaterally  Labs I have reviewed labs in epic and the results pertinent to this consultation are:   CBC    Component Value Date/Time   WBC 8.9 02/11/2019 1247   RBC 4.99 02/11/2019 1247   HGB 15.2 02/11/2019 1247   HGB 14.2 07/14/2018 0817   HCT 45.8 02/11/2019 1247   HCT 44.7  07/14/2018 0817   PLT 308 02/11/2019 1247   MCV 91.8 02/11/2019 1247   MCH 30.5 02/11/2019 1247   MCHC 33.2 02/11/2019 1247   RDW 12.4 02/11/2019 1247   LYMPHSABS 1.9 02/11/2019 1247   MONOABS 0.9 02/11/2019 1247   EOSABS 0.2 02/11/2019 1247   BASOSABS 0.1 02/11/2019 1247    CMP     Component Value Date/Time   NA 135 02/11/2019 1247   NA 138 07/14/2018 0817   K 4.2 02/11/2019 1247   CL 99 02/11/2019 1247   CO2 24 02/11/2019 1247   GLUCOSE 104 (H) 02/11/2019 1247   BUN 15 02/11/2019 1247   BUN 17 07/14/2018 0817   CREATININE 1.09 02/11/2019 1247   CREATININE 1.08 04/25/2018 1506   CALCIUM 9.6 02/11/2019 1247   PROT 7.4 02/11/2019 1247   PROT 7.1 10/28/2018 0848   ALBUMIN 4.3 02/11/2019 1247   ALBUMIN 4.9 10/28/2018 0848   AST 23 02/11/2019 1247   ALT 16 02/11/2019 1247   ALKPHOS 42 02/11/2019 1247   BILITOT 1.2 02/11/2019 1247   BILITOT 0.8 10/28/2018 0848   GFRNONAA >60 02/11/2019 1247   GFRAA >60 02/11/2019 1247    Lipid Panel     Component Value Date/Time   CHOL 154 10/28/2018 0848   TRIG 133 10/28/2018 0848   HDL 66 10/28/2018 0848   CHOLHDL 2.3 10/28/2018 0848   CHOLHDL 3.6 04/09/2018 0523   VLDL 40 04/09/2018 0523   LDLCALC 61 10/28/2018 0848     Imaging I have reviewed the images obtained:  CT-scan of the brain-no CT evidence of acute intracranial abnormality.  Redemonstrated small chronic focus of encephalomalacia within the anteroinferior left frontal lobe  Etta Quill PA-C [facility] [phone] 02/11/2019, 2:00 PM     Assessment: 53 year old male with history of stroke this year secondary to cardiac thrombus who presents to the ED today for evaluation of right frontal headache, subjective tingling of right hand and forearm and left eye twitching. Symptoms of headache and "eye spasms" began 4 days ago. Symptoms of right hand numbness began today at 1100 then spread to include his right forearm.  1. On exam patient does not show any  focal localizing or lateralizing signs. He does have saccadic low amplitude movements of his eyes when gazing to and fro, exacerbated by apparent increased anxiety during the exam.   2. STAT CT head: No evidence of acute intracranial abnormality. Redemonstrated small chronic focus of encephalomalacia within the anteroinferior left frontal lobe, most likely reflecting a remote brain contusion.  3. DDx for his presentation includes complicated migraine as well as atypical presentation of acute stroke. He is not a tPA candidate due to nonfocal exam with NIHSS of 0.  4. 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   5. Given the fact that he has had a stroke and has several stroke risk factors, we will obtain an MRI brain to rule out CVA. 6. Not a tPA candidate due to time criteria, anticoagulation and NIHSS of 0.     Impression: -Complicated migraine, less likely stroke -Headache -Subjective tingling in the right forearm and hand  Recommendations: - MRI brain - If MRI brain is negative no further neurological work-up recommended. At that time would treat migraine headache with migraine headache cocktail.   I have seen and examined the patient. I have formulated the assessment and recommendations. My exam was observed and documented by Etta Quill, PA.  Electronically signed: Dr. Kerney Elbe

## 2019-02-11 NOTE — ED Notes (Signed)
Patient transported to MRI 

## 2019-02-16 DIAGNOSIS — M270 Developmental disorders of jaws: Secondary | ICD-10-CM | POA: Diagnosis not present

## 2019-02-16 DIAGNOSIS — Z7289 Other problems related to lifestyle: Secondary | ICD-10-CM | POA: Diagnosis not present

## 2019-02-16 DIAGNOSIS — F17290 Nicotine dependence, other tobacco product, uncomplicated: Secondary | ICD-10-CM | POA: Diagnosis not present

## 2019-02-18 ENCOUNTER — Ambulatory Visit (INDEPENDENT_AMBULATORY_CARE_PROVIDER_SITE_OTHER): Payer: BC Managed Care – PPO | Admitting: Internal Medicine

## 2019-02-18 ENCOUNTER — Other Ambulatory Visit: Payer: Self-pay

## 2019-02-18 ENCOUNTER — Encounter: Payer: Self-pay | Admitting: Internal Medicine

## 2019-02-18 VITALS — BP 128/78 | HR 107 | Temp 97.3°F | Resp 18 | Ht 68.0 in | Wt 157.6 lb

## 2019-02-18 DIAGNOSIS — I10 Essential (primary) hypertension: Secondary | ICD-10-CM

## 2019-02-18 DIAGNOSIS — F411 Generalized anxiety disorder: Secondary | ICD-10-CM | POA: Diagnosis not present

## 2019-02-18 DIAGNOSIS — G43809 Other migraine, not intractable, without status migrainosus: Secondary | ICD-10-CM

## 2019-02-18 DIAGNOSIS — G43909 Migraine, unspecified, not intractable, without status migrainosus: Secondary | ICD-10-CM | POA: Insufficient documentation

## 2019-02-18 MED ORDER — ESCITALOPRAM OXALATE 10 MG PO TABS: 10.0000 mg | ORAL_TABLET | Freq: Every day | ORAL | 3 refills | Status: DC

## 2019-02-18 NOTE — Progress Notes (Signed)
Subjective:    Patient ID: Keith Pile., male    DOB: September 10, 1965, 53 y.o.   MRN: [id number]  DOS:  02/18/2019 Type of visit - description: Follow-up Since the last office visit, he was evaluated on the emergency room for severe headache.  CT head no acute  MRI brain, MRA head: No acute changes, small region at the left orbital frontal area with encephalomalacia likely due to previous trauma.  Vessels were normal.  Neurology consulted, neuro exam was nonfocal, they did notice low amplitude movements of his eyes when gazing to and fro, exacerbated apparently by anxiety.  Impression was likely complicated migraine less likely stroke and no further evaluation if MRs okay.  (They were okay/no acute)   Review of Systems Since he left  the ER reports that the headaches are "way better" but not 100% gone. Still have some twitching on and off at the left eye. Denies chest pain, difficulty breathing, lower extremity edema Occasional nausea when the headaches are intense. No vomiting, no diarrhea  Admits to severe stress and anxiety for the last few weeks.  Past Medical History:  Diagnosis Date  . Allergy   . CVA (cerebrovascular accident) ([place]) 04/08/2018  . GERD (gastroesophageal reflux disease)   . High cholesterol   . History of chicken pox   . Hypertension   . LV (left ventricular) mural thrombus without MI ([place])   . Migraine   . Ocular migraine     Past Surgical History:  Procedure Laterality Date  . HERNIA REPAIR Bilateral   . LEFT HEART CATH AND CORONARY ANGIOGRAPHY N/A 04/10/2018   Procedure: LEFT HEART CATH AND CORONARY ANGIOGRAPHY;  Surgeon: Lorretta Harp, MD;  Location: [place] [facility];  Service: Cardiovascular;  Laterality: N/A;    Social History   Socioeconomic History  . Marital status: Married    Spouse name: Not on file  . Number of children: 0  . Years of education: Not on file  . Highest education level: Not on file  Occupational History  .  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[employer]  Tobacco Use  . Smoking status: Never Smoker  . Smokeless tobacco: Current User    Types: Snuff  Substance and Sexual Activity  . Alcohol use: Yes    Alcohol/week: 25.0 standard drinks    Types: 25 Glasses of wine per week  . Drug use: Never  . Sexual activity: Not on file  Other Topics Concern  . Not on file  Social History Narrative   Lives w/ wife in [place], moved to  [place] 2019   Social Determinants of Health   Financial Resource Strain:   . Difficulty of Paying Living Expenses: Not on file  Food Insecurity:   . Worried About Charity fundraiser in the Last Year: Not on file  . Ran Out of Food in the Last Year: Not on file  Transportation Needs:   . Lack of Transportation (Medical): Not on file  . Lack of Transportation (Non-Medical): Not on file  Physical Activity:   . Days of Exercise per Week: Not on file  . Minutes of Exercise per Session: Not on file  Stress:   . Feeling of Stress : Not on file  Social Connections:   . Frequency of Communication with Friends and Family: Not on file  . Frequency of Social Gatherings with Friends and Family: Not on file  . Attends Religious Services: Not on file  . Active Member  of Clubs or Organizations: Not on file  . Attends Archivist Meetings: Not on file  . Marital Status: Not on file  Intimate Partner Violence:   . Fear of Current or Ex-Partner: Not on file  . Emotionally Abused: Not on file  . Physically Abused: Not on file  . Sexually Abused: Not on file      Allergies as of 02/18/2019   No Known Allergies     Medication List       Accurate as of February 18, 2019  1:26 PM. If you have any questions, ask your nurse or doctor.        acetaminophen 325 MG tablet Commonly known as: TYLENOL Take 650 mg by mouth as needed for pain.   ALPRAZolam 0.25 MG tablet Commonly known as: XANAX Take 1 tablet (0.25 mg total) by mouth 2  (two) times daily as needed for anxiety.   aspirin EC 81 MG tablet Take 1 tablet (81 mg total) by mouth daily.   atorvastatin 40 MG tablet Commonly known as: LIPITOR Take 1 tablet (40 mg total) by mouth daily.   cetirizine 10 MG tablet Commonly known as: ZYRTEC Take 10 mg by mouth daily.   CVS VIT D 5000 HIGH-POTENCY PO Take 1 capsule by mouth daily.   losartan 50 MG tablet Commonly known as: COZAAR Take 50 mg by mouth daily.   metoprolol tartrate 25 MG tablet Commonly known as: LOPRESSOR Take 1 tablet (25 mg total) by mouth 2 (two) times daily. What changed: when to take this   nitroGLYCERIN 0.4 MG SL tablet Commonly known as: NITROSTAT Place 1 tablet under the tongue every 5 (five) minutes as needed for chest pain.   pantoprazole 40 MG tablet Commonly known as: PROTONIX Take 1 tablet (40 mg total) by mouth daily.   valACYclovir 1000 MG tablet Commonly known as: Valtrex Take 2 tablets (2,000 mg total) by mouth 2 (two) times daily. Use for 1 day only for each cold sore outbrake What changed:   when to take this  reasons to take this  additional instructions           Objective:   Physical Exam BP 128/78 (BP Location: Left Arm, Patient Position: Sitting, Cuff Size: Normal)   Pulse (!) 107   Temp (!) 97.3 F (36.3 C) (Temporal)   Resp 18   Ht 5\' 8"  (1.727 m)   Wt 157 lb 9.6 oz (71.5 kg)   SpO2 100%   BMI 23.96 kg/m  General:   Well developed, NAD, BMI noted. HEENT:  Normocephalic . Face symmetric, atraumatic EOMI, pupils equal and reactive, very subtle left eyelid twitching at times during the exam.  No clear-cut nystagmus Lungs:  CTA B Normal respiratory effort, no intercostal retractions, no accessory muscle use. Heart: RRR,  no murmur.  No pretibial edema bilaterally  Skin: Not pale. Not jaundice Neurologic:  alert & oriented X3.  Speech normal, gait appropriate for age and unassisted.  Motor and DTRs symmetric Psych--  Cognition and  judgment appear intact.  Cooperative with normal attention span and concentration.  Behavior appropriate. Slightly anxious but not depressed appearing.      Assessment     Assessment (new patient, referred by Dr. Johnsie Cancel) HTN  GERD Hyperlipidemia Asthma? Allergies?  CV: -CVA acute 04-2018 -CAD, DX 04-2018, CABG 05-2018 at [facility] -Heart failure, DX 04-2018 in the context of a stroke, EF 30%. Vitamin D deficiency [facility] Neuro: -Migraines ocular migraine (?)  11-2017,  migraine, seen  at the ER 02/2019. -MRI brain: Encephalomalacia suspected to be from previous injury Moved to [place]  2019.   PLAN Migraine: Since the last office visit, he went to the emergency room, had imaging and saw neurology.  They felt that he had a complicated migraine.  Headache is much improved, he still have some involuntary movements of the eyelids. He has an appointment with neurology at [facility] 02/25/2019 and I recommend him to keep it, otherwise he could call and I will refer him to a local neurologist.  In the meantime if he has severe symptoms he needs to seek medical attention however by adding Lexapro and increasing beta-blockers (see next) I hope the headaches will improve. HTN: Well-controlled, on losartan and metoprolol but he is taking only 1 tablet of metoprolol at night.  His heart rate today is elevated.  Recommend to increase metoprolol to 25 mg to B.I.D. as recommended. Anxiety: That seems to be a major issue, he was extremely anxious few days ago when I talked to him and at the emergency room.  He admits that he is under a tremendous amount of stress in the last few weeks.  He is currently on Xanax.  Treatment should include: Exercise, counseling (information provided) and medications.  He feels is ready for medication, Rx Lexapro, watch for side effects, okay to continue Xanax. RTC 6 weeks   This visit occurred during the SARS-CoV-2 public health emergency.  Safety protocols were in place, including  screening questions prior to the visit, additional usage of staff PPE, and extensive cleaning of exam room while observing appropriate contact time as indicated for disinfecting solutions.

## 2019-02-18 NOTE — Assessment & Plan Note (Signed)
Migraine: Since the last office visit, he went to the emergency room, had imaging and saw neurology.  They felt that he had a complicated migraine.  Headache is much improved, he still have some involuntary movements of the eyelids. He has an appointment with neurology at [facility] 02/25/2019 and I recommend him to keep it, otherwise he could call and I will refer him to a local neurologist.  In the meantime if he has severe symptoms he needs to seek medical attention however by adding Lexapro and increasing beta-blockers (see next) I hope the headaches will improve. HTN: Well-controlled, on losartan and metoprolol but he is taking only 1 tablet of metoprolol at night.  His heart rate today is elevated.  Recommend to increase metoprolol to 25 mg to B.I.D. as recommended. Anxiety: That seems to be a major issue, he was extremely anxious few days ago when I talked to him and at the emergency room.  He admits that he is under a tremendous amount of stress in the last few weeks.  He is currently on Xanax.  Treatment should include: Exercise, counseling (information provided) and medications.  He feels is ready for medication, Rx Lexapro, watch for side effects, okay to continue Xanax. RTC 6 weeks

## 2019-02-18 NOTE — Patient Instructions (Addendum)
  GO TO THE FRONT DESK Schedule your next appointment   for a checkup in 6 weeks  For anxiety: Start escitalopram 1 tablet daily  Okay to take Xanax as needed  Please consider see a counselor  For the headaches: We will refer you to neurology (you have an appointment at [facility] 02/25/2019)  Tylenol as needed for headache

## 2019-03-13 ENCOUNTER — Other Ambulatory Visit: Payer: Self-pay | Admitting: Internal Medicine

## 2019-04-13 ENCOUNTER — Other Ambulatory Visit: Payer: Self-pay | Admitting: Internal Medicine

## 2019-04-13 ENCOUNTER — Other Ambulatory Visit: Payer: Self-pay | Admitting: Cardiovascular Disease

## 2019-04-15 ENCOUNTER — Ambulatory Visit: Payer: BC Managed Care – PPO | Admitting: Internal Medicine

## 2019-05-06 ENCOUNTER — Ambulatory Visit: Payer: Self-pay | Admitting: Internal Medicine

## 2019-05-22 ENCOUNTER — Ambulatory Visit: Payer: Self-pay | Attending: Internal Medicine

## 2019-05-22 DIAGNOSIS — Z23 Encounter for immunization: Secondary | ICD-10-CM

## 2019-05-22 NOTE — Progress Notes (Signed)
   [id number]  Name:  Keith Black.    MRN: [id number] DOB: 1966-01-14  05/22/2019  [name] was observed post Covid-19 immunization for 15 minutes without incident. He was provided with Vaccine Information Sheet and instruction to access the V-Safe system.   [name] was instructed to call 911 with any severe reactions post vaccine: Marland Kitchen Difficulty breathing  . Swelling of face and throat  . A fast heartbeat  . A bad rash all over body  . Dizziness and weakness   Immunizations Administered    Name Date Dose VIS Date Route   Pfizer COVID-19 Vaccine 05/22/2019 11:15 AM 0.3 mL 02/13/2019 Intramuscular   Manufacturer: [place]   Lot: [id number]   [place]: [id number]

## 2019-06-16 ENCOUNTER — Ambulatory Visit: Payer: Self-pay | Attending: Internal Medicine

## 2019-06-16 DIAGNOSIS — Z23 Encounter for immunization: Secondary | ICD-10-CM

## 2019-06-16 NOTE — Progress Notes (Signed)
   [id number] Vaccination [facility]  Name:  Keith Black.    MRN: [id number] DOB: 1965/05/16  06/16/2019  Mr. Keith Black was observed post Covid-19 immunization for 15 minutes without incident. He was provided with Vaccine Information Sheet and instruction to access the V-Safe system.   Mr. Keith Black was instructed to call 911 with any severe reactions post vaccine: Marland Kitchen Difficulty breathing  . Swelling of face and throat  . A fast heartbeat  . A bad rash all over body  . Dizziness and weakness   Immunizations Administered    Name Date Dose VIS Date Route   Pfizer COVID-19 Vaccine 06/16/2019 12:09 PM 0.3 mL 02/13/2019 Intramuscular   Manufacturer: [place]   Lot: [id number]   [place]: [id number]

## 2019-07-04 ENCOUNTER — Other Ambulatory Visit: Payer: Self-pay | Admitting: Cardiovascular Disease

## 2019-07-17 ENCOUNTER — Encounter: Payer: Self-pay | Admitting: Internal Medicine

## 2019-08-02 ENCOUNTER — Emergency Department (HOSPITAL_COMMUNITY)
Admission: EM | Admit: 2019-08-02 | Discharge: 2019-08-02 | Disposition: A | Discharge status: Home / Self Care | Payer: 59 | Attending: Emergency Medicine | Admitting: Emergency Medicine

## 2019-08-02 ENCOUNTER — Emergency Department (HOSPITAL_COMMUNITY): Payer: 59

## 2019-08-02 ENCOUNTER — Other Ambulatory Visit: Payer: Self-pay

## 2019-08-02 ENCOUNTER — Other Ambulatory Visit: Payer: Self-pay | Admitting: Cardiovascular Disease

## 2019-08-02 ENCOUNTER — Encounter (HOSPITAL_COMMUNITY): Payer: Self-pay | Admitting: Emergency Medicine

## 2019-08-02 DIAGNOSIS — Z7982 Long term (current) use of aspirin: Secondary | ICD-10-CM | POA: Diagnosis not present

## 2019-08-02 DIAGNOSIS — Y93H9 Activity, other involving exterior property and land maintenance, building and construction: Secondary | ICD-10-CM | POA: Diagnosis not present

## 2019-08-02 DIAGNOSIS — I5020 Unspecified systolic (congestive) heart failure: Secondary | ICD-10-CM | POA: Diagnosis not present

## 2019-08-02 DIAGNOSIS — S81851A Open bite, right lower leg, initial encounter: Secondary | ICD-10-CM | POA: Diagnosis not present

## 2019-08-02 DIAGNOSIS — Z23 Encounter for immunization: Secondary | ICD-10-CM | POA: Insufficient documentation

## 2019-08-02 DIAGNOSIS — Z85828 Personal history of other malignant neoplasm of skin: Secondary | ICD-10-CM | POA: Insufficient documentation

## 2019-08-02 DIAGNOSIS — S51852A Open bite of left forearm, initial encounter: Secondary | ICD-10-CM | POA: Insufficient documentation

## 2019-08-02 DIAGNOSIS — Y999 Unspecified external cause status: Secondary | ICD-10-CM | POA: Insufficient documentation

## 2019-08-02 DIAGNOSIS — W540XXA Bitten by dog, initial encounter: Secondary | ICD-10-CM | POA: Diagnosis not present

## 2019-08-02 DIAGNOSIS — I110 Hypertensive heart disease with heart failure: Secondary | ICD-10-CM | POA: Diagnosis not present

## 2019-08-02 DIAGNOSIS — Z203 Contact with and (suspected) exposure to rabies: Secondary | ICD-10-CM | POA: Diagnosis not present

## 2019-08-02 DIAGNOSIS — F17220 Nicotine dependence, chewing tobacco, uncomplicated: Secondary | ICD-10-CM | POA: Diagnosis not present

## 2019-08-02 DIAGNOSIS — Y92017 Garden or yard in single-family (private) house as the place of occurrence of the external cause: Secondary | ICD-10-CM | POA: Diagnosis not present

## 2019-08-02 DIAGNOSIS — S59912A Unspecified injury of left forearm, initial encounter: Secondary | ICD-10-CM | POA: Diagnosis present

## 2019-08-02 DIAGNOSIS — Z2914 Encounter for prophylactic rabies immune globin: Secondary | ICD-10-CM | POA: Diagnosis not present

## 2019-08-02 MED ORDER — RABIES IMMUNE GLOBULIN 150 UNIT/ML IM INJ
20.0000 [IU]/kg | INJECTION | Freq: Once | INTRAMUSCULAR | Status: AC
  Administered 2019-08-02: 1350 [IU] via INTRAMUSCULAR
  Filled 2019-08-02: qty 9

## 2019-08-02 MED ORDER — RABIES VACCINE, PCEC IM SUSR
1.0000 mL | Freq: Once | INTRAMUSCULAR | Status: AC
  Administered 2019-08-02: 1 mL via INTRAMUSCULAR
  Filled 2019-08-02: qty 1

## 2019-08-02 MED ORDER — TETANUS-DIPHTH-ACELL PERTUSSIS 5-2.5-18.5 LF-MCG/0.5 IM SUSP
0.5000 mL | Freq: Once | INTRAMUSCULAR | Status: AC
  Administered 2019-08-02: 0.5 mL via INTRAMUSCULAR
  Filled 2019-08-02: qty 0.5

## 2019-08-02 MED ORDER — AMOXICILLIN-POT CLAVULANATE 875-125 MG PO TABS
1.0000 | ORAL_TABLET | Freq: Two times a day (BID) | ORAL | 0 refills | Status: DC
Start: 2019-08-02 — End: 2019-10-02

## 2019-08-02 MED ORDER — HYDROCODONE-ACETAMINOPHEN 5-325 MG PO TABS
1.0000 | ORAL_TABLET | Freq: Once | ORAL | Status: AC
  Administered 2019-08-02: 1 via ORAL
  Filled 2019-08-02: qty 1

## 2019-08-02 NOTE — ED Triage Notes (Addendum)
Pt was in his backyard painting when 2 unknown dogs came up and bit him on L forearm and R lower leg.  Pt said "good dog" to them and they sat and let go.  The police contacted the dog owner that states dogs are vaccinated but didn't have records.   Puncture wound/lac to L forearm- bleeding controlled.

## 2019-08-02 NOTE — Discharge Instructions (Signed)
You were given a prescription for antibiotics. Please take the antibiotic prescription fully.   Please follow-up with your regular doctor or at urgent care for the remainder of your rabies vaccinations.  Monitor your symptoms closely. Please return to the emergency room immediately if you experience any new or worsening symptoms or any symptoms that indicate worsening infection such as fevers, increased redness/swelling/pain, warmth, or drainage from the affected area.

## 2019-08-02 NOTE — ED Provider Notes (Addendum)
[name] Provider Note   CSN: [id number] Arrival date & time: 08/02/19  1641     History Chief Complaint  Patient presents with  . Animal Bite    Keith Black. is a 54 y.o. male.  HPI   54 year old male with a history of allergies, CVA, GERD, hyperlipidemia, hypertension, migraine, who presents the emergency department today for evaluation after a dog bite.  Patient states he was in his backyard painting when 2 unknown dogs came up and bit him on the left forearm and right lower leg.  Patient states that the bite was unprovoked.  States that the dog owner was unable to produce vaccine records and they do not trust that the dogs are vaccinated.  He is not sure when his last Tdap was.  He denies any other injuries.  He has normal range of motion and sensation of the injured areas  Past Medical History:  Diagnosis Date  . Allergy   . CVA (cerebrovascular accident) ([place]) 04/08/2018  . GERD (gastroesophageal reflux disease)   . High cholesterol   . History of chicken pox   . Hypertension   . LV (left ventricular) mural thrombus without MI ([place])   . Migraine   . Ocular migraine     Patient Active Problem List   Diagnosis Date Noted  . Migraine 02/18/2019  . PCP NOTES >>>>>>>>>> 04/27/2018  . Squamous cell carcinoma of back 04/25/2018  . Basal cell carcinoma (BCC) of anterior chest 04/25/2018  . CVA (cerebral vascular accident) ([place]) 04/09/2018  . LV (left ventricular) mural thrombus without MI ([place]) 04/09/2018  . Hyperlipidemia 04/09/2018  . Essential hypertension 04/09/2018  . Systolic dysfunction with heart failure ([place]) 04/09/2018  . Cough variant asthma 11/13/2017  . Vitamin D deficiency 07/02/2012  . Herpes simplex virus (HSV) infection 02/11/2012  . Esophageal reflux 03/15/2011  . Anxiety state 09/08/2009    Past Surgical History:  Procedure Laterality Date  . HERNIA REPAIR Bilateral   . LEFT HEART CATH AND CORONARY  ANGIOGRAPHY N/A 04/10/2018   Procedure: LEFT HEART CATH AND CORONARY ANGIOGRAPHY;  Surgeon: Lorretta Harp, MD;  Location: [facility];  Service: Cardiovascular;  Laterality: N/A;       Family History  Problem Relation Age of Onset  . Allergies Mother   . Asthma Mother   . Heart disease Mother 26       had stents and then CABG  . Stroke Father 31  . Colon cancer Neg Hx   . Prostate cancer Neg Hx     Social History   Tobacco Use  . Smoking status: Never Smoker  . Smokeless tobacco: Current User    Types: Snuff  Substance Use Topics  . Alcohol use: Yes    Alcohol/week: 25.0 standard drinks    Types: 25 Glasses of wine per week  . Drug use: Never    Home Medications Prior to Admission medications   Medication Sig Start Date End Date Taking? Authorizing Provider  acetaminophen (TYLENOL) 325 MG tablet Take 650 mg by mouth as needed for pain.    [provider]  ALPRAZolam Duanne Moron) 0.25 MG tablet Take 1 tablet (0.25 mg total) by mouth 2 (two) times daily as needed for anxiety. 02/11/19   Colon Branch, MD  amoxicillin-clavulanate (AUGMENTIN) 875-125 MG tablet Take 1 tablet by mouth every 12 (twelve) hours. 08/02/19   Elmore Hyslop S, PA-C  aspirin 81 MG EC tablet Take 1 tablet (81  mg total) by mouth daily. 04/13/19   Colon Branch, MD  atorvastatin (LIPITOR) 40 MG tablet TAKE 1 TABLET BY MOUTH EVERY DAY 07/06/19   Josue Hector, MD  cetirizine (ZYRTEC) 10 MG tablet Take 10 mg by mouth daily.    [provider]  Cholecalciferol (CVS VIT D 5000 HIGH-POTENCY PO) Take 1 capsule by mouth daily.    [provider]  escitalopram (LEXAPRO) 10 MG tablet TAKE 1 TABLET BY MOUTH EVERY DAY 03/13/19   Colon Branch, MD  losartan (COZAAR) 50 MG tablet Take 50 mg by mouth daily.    [provider]  metoprolol tartrate (LOPRESSOR) 25 MG tablet TAKE 1 TABLET BY MOUTH TWICE A DAY 04/14/19   Josue Hector, MD  nitroGLYCERIN (NITROSTAT) 0.4 MG SL tablet Place 1 tablet  under the tongue every 5 (five) minutes as needed for chest pain. 09/04/18 09/04/19  [provider]  pantoprazole (PROTONIX) 40 MG tablet Take 1 tablet (40 mg total) by mouth daily. 07/29/18   Colon Branch, MD  valACYclovir (VALTREX) 1000 MG tablet Take 2 tablets (2,000 mg total) by mouth 2 (two) times daily. Use for 1 day only for each cold sore outbrake Patient taking differently: Take 2,000 mg by mouth 2 (two) times daily as needed (outbreaks).  10/17/18   Colon Branch, MD    Allergies    Patient has no known allergies.  Review of Systems   Review of Systems  Constitutional: Negative for fever.  Respiratory: Negative for shortness of breath.   Cardiovascular: Negative for chest pain.  Gastrointestinal: Negative for abdominal pain.  Musculoskeletal:       Forearm pain, mild right lower leg pain  Skin: Negative for wound.  Neurological: Negative for weakness and numbness.    Physical Exam Updated Vital Signs BP 132/77 (BP Location: Right Arm)   Pulse 66   Temp 98.1 F (36.7 C) (Oral)   Resp 18   Ht 5\' 8"  (1.727 m)   Wt 68 kg   SpO2 100%   BMI 22.81 kg/m   Physical Exam Constitutional:      General: He is not in acute distress.    Appearance: He is well-developed.  Eyes:     Conjunctiva/sclera: Conjunctivae normal.  Cardiovascular:     Rate and Rhythm: Normal rate.  Pulmonary:     Effort: Pulmonary effort is normal.  Musculoskeletal:     Comments: Superficial abrasion to the right lower leg.  No breaks in the skin.  No bony tenderness to the tip/fib.  Puncture wound noted to the mid left forearm with mild associated swelling.  No obvious bony tenderness.  Radial/ulnar pulses are intact distally.  Brisk cap refill to all fingers.  Grip strength intact and flexion/extension intact at the wrist with normal strength.  Skin:    General: Skin is warm and dry.  Neurological:     Mental Status: He is alert and oriented to person, place, and time.     ED Results /  Procedures / Treatments   Labs (all labs ordered are listed, but only abnormal results are displayed) Labs Reviewed - No data to display  EKG None  Radiology DG Forearm Left  Result Date: 08/02/2019 CLINICAL DATA:  Dog bite.  Puncture wound. EXAM: LEFT FOREARM - 2 VIEW COMPARISON:  None. FINDINGS: No fracture of the radius or ulna. The elbow joint and the wrist joint appear normal on two views. No foreign body IMPRESSION: No fracture or foreign  body. Electronically Signed   By: Suzy Bouchard M.D.   On: 08/02/2019 18:47    Procedures Procedures (including critical care time)  Medications Ordered in ED Medications  Tdap (BOOSTRIX) injection 0.5 mL (0.5 mLs Intramuscular Given 08/02/19 1836)  rabies vaccine (RABAVERT) injection 1 mL (1 mL Intramuscular Given 08/02/19 1855)  HYDROcodone-acetaminophen (NORCO/VICODIN) 5-325 MG per tablet 1 tablet (1 tablet Oral Given 08/02/19 1836)  rabies immune globulin (HYPERAB/KEDRAB) injection 1,350 Units (1,350 Units Intramuscular Given 08/02/19 1900)    ED Course  I have reviewed the triage vital signs and the nursing notes.  Pertinent labs & imaging results that were available during my care of the patient were reviewed by me and considered in my medical decision making (see chart for details).    MDM Rules/Calculators/A&P                      Patient presents with laceration from a dog bite.  Pt wounds irrigated well with sterile saline.  Wounds examined with visualization of the base and no foreign bodies seen.  Pt Alert and oriented, NAD, nontoxic, nonseptic appearing.  Capillary refill intact and pt without neurologic deficit.  Left forearm x-ray reviewed/interpreted with no obvious fractures or foreign body. Patient tetanus updated.  Patient rabies vaccine and immunoglobulin risk and benefit discussed.  Pt consents. Pain treated in the emergency department. Wounds not closed secondary to concern for infection. We'll discharge home with pain  medication, Augmentin and requests for close follow-up with PCP or back in the ER. Pt and wife at bedside voice understanding of the plan and reasons to return. All questions answered, pt stable for discharge.    Final Clinical Impression(s) / ED Diagnoses Final diagnoses:  Dog bite, initial encounter    Rx / DC Orders ED Discharge Orders         Ordered    amoxicillin-clavulanate (AUGMENTIN) 875-125 MG tablet  Every 12 hours     08/02/19 1858           Rodney Booze, PA-C 08/02/19 1944    Rodney Booze, PA-C 08/02/19 [place], [place], DO 08/02/19 2201

## 2019-08-02 NOTE — ED Notes (Signed)
Discharge instructions, including rabies clinic dates, discussed with pt. Pt verbalized understanding with no questions at this time. Pt to go home with family at bedside.

## 2019-08-05 ENCOUNTER — Ambulatory Visit (HOSPITAL_COMMUNITY)
Admission: EM | Admit: 2019-08-05 | Discharge: 2019-08-05 | Disposition: A | Discharge status: Home / Self Care | Payer: 59 | Attending: Internal Medicine | Admitting: Internal Medicine

## 2019-08-05 ENCOUNTER — Other Ambulatory Visit: Payer: Self-pay

## 2019-08-05 DIAGNOSIS — Z203 Contact with and (suspected) exposure to rabies: Secondary | ICD-10-CM

## 2019-08-05 MED ORDER — RABIES VACCINE, PCEC IM SUSR
INTRAMUSCULAR | Status: AC
  Filled 2019-08-05: qty 1

## 2019-08-05 MED ORDER — RABIES VACCINE, PCEC IM SUSR
1.0000 mL | Freq: Once | INTRAMUSCULAR | Status: AC
  Administered 2019-08-05: 1 mL via INTRAMUSCULAR

## 2019-08-05 NOTE — ED Triage Notes (Signed)
Pt presents for day 3 (second dose) rabies vaccination.

## 2019-08-09 ENCOUNTER — Encounter (HOSPITAL_COMMUNITY): Payer: Self-pay | Admitting: Emergency Medicine

## 2019-08-09 ENCOUNTER — Other Ambulatory Visit: Payer: Self-pay

## 2019-08-09 ENCOUNTER — Ambulatory Visit (HOSPITAL_COMMUNITY)
Admission: EM | Admit: 2019-08-09 | Discharge: 2019-08-09 | Disposition: A | Discharge status: Home / Self Care | Payer: 59

## 2019-08-09 ENCOUNTER — Ambulatory Visit (HOSPITAL_COMMUNITY)
Admission: EM | Admit: 2019-08-09 | Discharge: 2019-08-09 | Disposition: A | Discharge status: Home / Self Care | Payer: 59 | Attending: Internal Medicine | Admitting: Internal Medicine

## 2019-08-09 DIAGNOSIS — Z203 Contact with and (suspected) exposure to rabies: Secondary | ICD-10-CM

## 2019-08-09 MED ORDER — RABIES VACCINE, PCEC IM SUSR
INTRAMUSCULAR | Status: AC
  Filled 2019-08-09: qty 1

## 2019-08-09 MED ORDER — RABIES VACCINE, PCEC IM SUSR
1.0000 mL | Freq: Once | INTRAMUSCULAR | Status: AC
  Administered 2019-08-09: 1 mL via INTRAMUSCULAR

## 2019-08-09 NOTE — ED Triage Notes (Signed)
Patient is in department for Day #7 in rabies series.  Initial exposure and ED visit was 5/30

## 2019-08-09 NOTE — ED Notes (Signed)
This patient did return, same day, for rabies vaccine

## 2019-08-09 NOTE — ED Notes (Signed)
Keith Black, patient access reported patient left 2 minutes after arrival.  Instructed Keith Black, patient access to call patient, patient did not return.

## 2019-08-16 ENCOUNTER — Ambulatory Visit (HOSPITAL_COMMUNITY)
Admission: EM | Admit: 2019-08-16 | Discharge: 2019-08-16 | Disposition: A | Discharge status: Home / Self Care | Payer: 59 | Attending: Internal Medicine | Admitting: Internal Medicine

## 2019-08-16 ENCOUNTER — Other Ambulatory Visit: Payer: Self-pay | Admitting: Cardiovascular Disease

## 2019-08-16 ENCOUNTER — Other Ambulatory Visit: Payer: Self-pay

## 2019-08-16 DIAGNOSIS — Z23 Encounter for immunization: Secondary | ICD-10-CM | POA: Diagnosis not present

## 2019-08-16 MED ORDER — RABIES VACCINE, PCEC IM SUSR
INTRAMUSCULAR | Status: AC
  Filled 2019-08-16: qty 1

## 2019-08-16 MED ORDER — RABIES VACCINE, PCEC IM SUSR
1.0000 mL | Freq: Once | INTRAMUSCULAR | Status: AC
  Administered 2019-08-16: 1 mL via INTRAMUSCULAR

## 2019-08-16 NOTE — ED Triage Notes (Signed)
Pt presents for final rabies injection. 

## 2019-08-26 ENCOUNTER — Other Ambulatory Visit: Payer: Self-pay | Admitting: Cardiovascular Disease

## 2019-09-09 ENCOUNTER — Other Ambulatory Visit: Payer: Self-pay | Admitting: Internal Medicine

## 2019-09-23 ENCOUNTER — Other Ambulatory Visit: Payer: Self-pay

## 2019-09-23 ENCOUNTER — Other Ambulatory Visit: Payer: Self-pay | Admitting: Internal Medicine

## 2019-09-23 MED ORDER — ESCITALOPRAM OXALATE 10 MG PO TABS: 10.0000 mg | ORAL_TABLET | Freq: Every day | ORAL | 0 refills | Status: DC

## 2019-09-27 ENCOUNTER — Other Ambulatory Visit: Payer: Self-pay | Admitting: Cardiovascular Disease

## 2019-10-02 ENCOUNTER — Ambulatory Visit (INDEPENDENT_AMBULATORY_CARE_PROVIDER_SITE_OTHER): Payer: 59 | Admitting: Internal Medicine

## 2019-10-02 ENCOUNTER — Encounter: Payer: Self-pay | Admitting: Internal Medicine

## 2019-10-02 ENCOUNTER — Other Ambulatory Visit: Payer: Self-pay

## 2019-10-02 VITALS — BP 123/82 | HR 75 | Temp 98.7°F | Resp 18 | Ht 68.0 in | Wt 160.4 lb

## 2019-10-02 DIAGNOSIS — R739 Hyperglycemia, unspecified: Secondary | ICD-10-CM

## 2019-10-02 DIAGNOSIS — Z0000 Encounter for general adult medical examination without abnormal findings: Secondary | ICD-10-CM | POA: Insufficient documentation

## 2019-10-02 DIAGNOSIS — G43809 Other migraine, not intractable, without status migrainosus: Secondary | ICD-10-CM

## 2019-10-02 DIAGNOSIS — Z125 Encounter for screening for malignant neoplasm of prostate: Secondary | ICD-10-CM

## 2019-10-02 DIAGNOSIS — I10 Essential (primary) hypertension: Secondary | ICD-10-CM

## 2019-10-02 DIAGNOSIS — I25810 Atherosclerosis of coronary artery bypass graft(s) without angina pectoris: Secondary | ICD-10-CM | POA: Diagnosis not present

## 2019-10-02 DIAGNOSIS — E785 Hyperlipidemia, unspecified: Secondary | ICD-10-CM | POA: Diagnosis not present

## 2019-10-02 LAB — COMPREHENSIVE METABOLIC PANEL
ALT: 16 U/L (ref 0–53)
AST: 20 U/L (ref 0–37)
Albumin: 4.5 g/dL (ref 3.5–5.2)
Alkaline Phosphatase: 46 U/L (ref 39–117)
BUN: 17 mg/dL (ref 6–23)
CO2: 29 mEq/L (ref 19–32)
Calcium: 9.9 mg/dL (ref 8.4–10.5)
Chloride: 96 mEq/L (ref 96–112)
Creatinine, Ser: 1.09 mg/dL (ref 0.40–1.50)
GFR: 70.48 mL/min (ref >60.00)
Glucose, Bld: 111 mg/dL — ABNORMAL HIGH (ref 70–99)
Potassium: 5 mEq/L (ref 3.5–5.1)
Sodium: 133 mEq/L — ABNORMAL LOW (ref 135–145)
Total Bilirubin: 0.9 mg/dL (ref 0.2–1.2)
Total Protein: 7.3 g/dL (ref 6.0–8.3)

## 2019-10-02 LAB — CBC WITH DIFFERENTIAL/PLATELET
Basophils Absolute: 0.1 10*3/uL (ref 0.0–0.1)
Basophils Relative: 2.1 % (ref 0.0–3.0)
Eosinophils Absolute: 0.2 10*3/uL (ref 0.0–0.7)
Eosinophils Relative: 2.9 % (ref 0.0–5.0)
HCT: 42.4 % (ref 39.0–52.0)
Hemoglobin: 14.3 g/dL (ref 13.0–17.0)
Lymphocytes Relative: 23.3 % (ref 12.0–46.0)
Lymphs Abs: 1.4 10*3/uL (ref 0.7–4.0)
MCHC: 33.8 g/dL (ref 30.0–36.0)
MCV: 94.1 fl (ref 78.0–100.0)
Monocytes Absolute: 0.7 10*3/uL (ref 0.1–1.0)
Monocytes Relative: 11.2 % (ref 3.0–12.0)
Neutro Abs: 3.8 10*3/uL (ref 1.4–7.7)
Neutrophils Relative %: 60.5 % (ref 43.0–77.0)
Platelets: 317 10*3/uL (ref 150.0–400.0)
RBC: 4.5 Mil/uL (ref 4.22–5.81)
RDW: 12.7 % (ref 11.5–15.5)
WBC: 6.2 10*3/uL (ref 4.0–10.5)

## 2019-10-02 LAB — HEMOGLOBIN A1C: Hgb A1c MFr Bld: 5.4 % (ref 4.6–6.5)

## 2019-10-02 LAB — LIPID PANEL
Cholesterol: 202 mg/dL — ABNORMAL HIGH (ref 0–200)
HDL: 77.4 mg/dL (ref >39.00)
LDL Cholesterol: 107 mg/dL — ABNORMAL HIGH (ref 0–99)
NonHDL: 124.54
Total CHOL/HDL Ratio: 3
Triglycerides: 89 mg/dL (ref 0.0–149.0)
VLDL: 17.8 mg/dL (ref 0.0–40.0)

## 2019-10-02 LAB — PSA: PSA: 0.4 ng/mL (ref 0.10–4.00)

## 2019-10-02 MED ORDER — METOPROLOL TARTRATE 25 MG PO TABS: 25.0000 mg | ORAL_TABLET | Freq: Two times a day (BID) | ORAL | 1 refills | Status: DC

## 2019-10-02 MED ORDER — LOSARTAN POTASSIUM 50 MG PO TABS: 50.0000 mg | ORAL_TABLET | Freq: Every day | ORAL | 1 refills | Status: DC

## 2019-10-02 MED ORDER — ATORVASTATIN CALCIUM 40 MG PO TABS: ORAL_TABLET | ORAL | 1 refills | Status: DC

## 2019-10-02 MED ORDER — ESCITALOPRAM OXALATE 10 MG PO TABS: 10.0000 mg | ORAL_TABLET | Freq: Every day | ORAL | 1 refills | Status: DC

## 2019-10-02 MED ORDER — PANTOPRAZOLE SODIUM 40 MG PO TBEC: 40.0000 mg | DELAYED_RELEASE_TABLET | Freq: Every day | ORAL | 3 refills | Status: DC

## 2019-10-02 NOTE — Progress Notes (Signed)
Subjective:    Patient ID: Keith Pile., male    DOB: 1966/01/10, 54 y.o.   MRN: [id number]  DOS:  10/02/2019 Type of visit - description: Routine checkup Since the last office visit he is doing well. Saw neurology, note reviewed. History of psoriasis, has been slightly exacerbated lately.  Wt Readings from Last 3 Encounters:  10/02/19 160 lb 6 oz (72.7 kg)  08/02/19 150 lb (68 kg)  02/18/19 157 lb 9.6 oz (71.5 kg)     Review of Systems Denies chest pain difficulty breathing. No edema No nausea, vomiting, diarrhea  Past Medical History:  Diagnosis Date  . Allergy   . CVA (cerebrovascular accident) ([place]) 04/08/2018  . GERD (gastroesophageal reflux disease)   . High cholesterol   . History of chicken pox   . Hypertension   . LV (left ventricular) mural thrombus without MI ([place])   . Migraine   . Ocular migraine     Past Surgical History:  Procedure Laterality Date  . HERNIA REPAIR Bilateral   . LEFT HEART CATH AND CORONARY ANGIOGRAPHY N/A 04/10/2018   Procedure: LEFT HEART CATH AND CORONARY ANGIOGRAPHY;  Surgeon: Lorretta Harp, MD;  Location: [facility];  Service: Cardiovascular;  Laterality: N/A;    Allergies as of 10/02/2019   No Known Allergies     Medication List       Accurate as of October 02, 2019 11:59 PM. If you have any questions, ask your nurse or doctor.        STOP taking these medications   amoxicillin-clavulanate 875-125 MG tablet Commonly known as: AUGMENTIN Stopped by: Kathlene November, MD     TAKE these medications   acetaminophen 325 MG tablet Commonly known as: TYLENOL Take 650 mg by mouth as needed for pain.   ALPRAZolam 0.25 MG tablet Commonly known as: XANAX Take 1 tablet (0.25 mg total) by mouth 2 (two) times daily as needed for anxiety.   aspirin 81 MG EC tablet Take 1 tablet (81 mg total) by mouth daily.   atorvastatin 40 MG tablet Commonly known as: LIPITOR TAKE 1 TABLET BY MOUTH DAILY. What changed: See the new  instructions. Changed by: Kathlene November, MD   cetirizine 10 MG tablet Commonly known as: ZYRTEC Take 10 mg by mouth daily.   CVS VIT D 5000 HIGH-POTENCY PO Take 1 capsule by mouth daily.   escitalopram 10 MG tablet Commonly known as: LEXAPRO Take 1 tablet (10 mg total) by mouth daily.   losartan 50 MG tablet Commonly known as: COZAAR Take 1 tablet (50 mg total) by mouth daily.   metoprolol tartrate 25 MG tablet Commonly known as: LOPRESSOR Take 1 tablet (25 mg total) by mouth 2 (two) times daily.   nitroGLYCERIN 0.4 MG SL tablet Commonly known as: NITROSTAT Place 1 tablet under the tongue every 5 (five) minutes as needed for chest pain.   pantoprazole 40 MG tablet Commonly known as: PROTONIX Take 1 tablet (40 mg total) by mouth daily.   valACYclovir 1000 MG tablet Commonly known as: Valtrex Take 2 tablets (2,000 mg total) by mouth 2 (two) times daily. Use for 1 day only for each cold sore outbrake What changed:   when to take this  reasons to take this  additional instructions          Objective:   Physical Exam BP 123/82 (BP Location: Left Arm, Patient Position: Sitting, Cuff Size: Small)   Pulse 75   Temp 98.7 F (37.1  C) (Oral)   Resp 18   Ht 5\' 8"  (1.727 m)   Wt 160 lb 6 oz (72.7 kg)   SpO2 100%   BMI 24.38 kg/m  General:   Well developed, NAD, BMI noted.  HEENT:  Normocephalic . Face symmetric, atraumatic Lungs:  CTA B Normal respiratory effort, no intercostal retractions, no accessory muscle use. Heart: RRR,  no murmur.  Abdomen:  Not distended, soft, non-tender. No rebound or rigidity.   Skin: Not pale. Not jaundice Lower extremities: no pretibial edema bilaterally  Neurologic:  alert & oriented X3.  Speech normal, gait appropriate for age and unassisted Psych--  Cognition and judgment appear intact.  Cooperative with normal attention span and concentration.  Behavior appropriate. No anxious or depressed appearing.     Assessment      Assessment (referred by Dr. Johnsie Cancel) HTN  GERD Hyperlipidemia Asthma? Allergies?  CV: -CVA acute 04-2018, subsequently diagnosed with CHF 04-2018 -CHF EF 30%, CAD, DX 04-2018, CABG 05-2018 at [facility] Vitamin D deficiency BCC-SCC Neuro: -Migraines ocular migraine (?)  11-2017,  migraine, seen at the ER 02/2019. -MRI brain: Encephalomalacia suspected to be from previous injury Moved to [place]  2019. Psoriasis  PLAN HTN: BP is very good, continue losartan, metoprolol, checking labs Hyperlipidemia: On Lipitor, checking labs CAD: Asymptomatic, plans to see Dr. Johnsie Cancel. Migraines: Saw neurology at [facility] 02/25/2019, summary: History of a stroke noted to have no residual symptoms.  They recommended CV RF modification. Headaches: Concerning for migraines.  Rx magnesium, baclofen and Zofran. 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 At this point migraines have significantly decreased, he still has some eye twitching. Hyperglycemia: A1c 04-2018 was 5.7, A1c concept discussed. Preventive care: Had Covid vaccinations CCS: Reports a colonoscopy at age 7, + polyps.  Repeat colonoscopy at age 15: Normal.  Recommend to bring reports order me know the name of the doctor if possible. Check a PSA RTC for CPX in 6 months   This visit occurred during the SARS-CoV-2 public health emergency.  Safety protocols were in place, including screening questions prior to the visit, additional usage of staff PPE, and extensive cleaning of exam room while observing appropriate contact time as indicated for disinfecting solutions.

## 2019-10-02 NOTE — Progress Notes (Signed)
Pre visit review using our clinic review tool, if applicable. No additional management support is needed unless otherwise documented below in the visit note. 

## 2019-10-02 NOTE — Patient Instructions (Signed)
Check the  blood pressure regularly BP GOAL is between 110/65 and  135/85. If it is consistently higher or lower, let me know      GO TO THE LAB : Get the blood work     GO TO THE FRONT DESK, PLEASE SCHEDULE YOUR APPOINTMENTS Come back for   a physical exam in 6 months 

## 2019-10-03 NOTE — Assessment & Plan Note (Signed)
HTN: BP is very good, continue losartan, metoprolol, checking labs Hyperlipidemia: On Lipitor, checking labs CAD: Asymptomatic, plans to see Dr. Johnsie Cancel. Migraines: Saw neurology at [facility] 02/25/2019, summary: History of a stroke noted to have no residual symptoms.  They recommended CV RF modification. Headaches: Concerning for migraines.  Rx magnesium, baclofen and Zofran. 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 At this point migraines have significantly decreased, he still has some eye twitching. Hyperglycemia: A1c 04-2018 was 5.7, A1c concept discussed. Preventive care: Had Covid vaccinations CCS: Reports a colonoscopy at age 54, + polyps.  Repeat colonoscopy at age 70: Normal.  Recommend to bring reports order me know the name of the doctor if possible. Check a PSA RTC for CPX in 6 months

## 2019-10-05 MED ORDER — ATORVASTATIN CALCIUM 80 MG PO TABS
80.0000 mg | ORAL_TABLET | Freq: Every day | ORAL | 3 refills | Status: DC
Start: 2019-10-05 — End: 2020-04-27

## 2019-10-05 NOTE — Addendum Note (Signed)
Addended byDamita Dunnings D on: 10/05/2019 07:48 AM   Modules accepted: Orders

## 2019-10-06 ENCOUNTER — Telehealth: Payer: Self-pay | Admitting: Internal Medicine

## 2019-10-06 DIAGNOSIS — Z79899 Other long term (current) drug therapy: Secondary | ICD-10-CM

## 2019-10-06 DIAGNOSIS — F411 Generalized anxiety disorder: Secondary | ICD-10-CM

## 2019-10-06 NOTE — Telephone Encounter (Signed)
PDMP okay, he takes Xanax sporadically, RF sent

## 2019-10-06 NOTE — Telephone Encounter (Signed)
Alprazolam refill.   Last OV: 10/02/2019 Last Fill: 02/11/2019 #60 and 0RF Pt sig: 1 tab bid prn UDS: None

## 2019-10-09 ENCOUNTER — Ambulatory Visit: Payer: 59 | Admitting: Internal Medicine

## 2019-12-19 ENCOUNTER — Ambulatory Visit: Payer: 59 | Attending: Internal Medicine

## 2019-12-19 DIAGNOSIS — Z23 Encounter for immunization: Secondary | ICD-10-CM

## 2019-12-19 NOTE — Progress Notes (Signed)
   [facility]  Name:  Keith Black.    MRN: [id number] DOB: December 12, 1965  12/19/2019  [name] was observed post Covid-19 immunization for 15 minutes without incident. He was provided with Vaccine Information Sheet and instruction to access the V-Safe system.   [name] was instructed to call 911 with any severe reactions post vaccine: Marland Kitchen Difficulty breathing  . Swelling of face and throat  . A fast heartbeat  . A bad rash all over body  . Dizziness and weakness

## 2019-12-22 ENCOUNTER — Encounter: Payer: Self-pay | Admitting: Internal Medicine

## 2019-12-22 ENCOUNTER — Telehealth: Payer: Self-pay | Admitting: Internal Medicine

## 2019-12-22 NOTE — Telephone Encounter (Signed)
[facility]  Call Back # [phone] Donella Stade is requesting the most recent LAB results to be Fax to [phone]

## 2019-12-22 NOTE — Telephone Encounter (Signed)
Faxed

## 2020-02-18 ENCOUNTER — Other Ambulatory Visit: Payer: Self-pay | Admitting: Internal Medicine

## 2020-04-07 ENCOUNTER — Encounter: Payer: Self-pay | Admitting: Internal Medicine

## 2020-04-07 ENCOUNTER — Encounter: Payer: 59 | Admitting: Internal Medicine

## 2020-04-20 ENCOUNTER — Encounter: Payer: 59 | Admitting: Internal Medicine

## 2020-04-27 ENCOUNTER — Ambulatory Visit (INDEPENDENT_AMBULATORY_CARE_PROVIDER_SITE_OTHER): Payer: 59 | Admitting: Internal Medicine

## 2020-04-27 ENCOUNTER — Other Ambulatory Visit: Payer: Self-pay

## 2020-04-27 ENCOUNTER — Encounter: Payer: Self-pay | Admitting: Internal Medicine

## 2020-04-27 VITALS — BP 146/82 | HR 88 | Temp 98.9°F | Resp 16 | Ht 68.0 in | Wt 170.5 lb

## 2020-04-27 DIAGNOSIS — Z0000 Encounter for general adult medical examination without abnormal findings: Secondary | ICD-10-CM | POA: Diagnosis not present

## 2020-04-27 DIAGNOSIS — Z23 Encounter for immunization: Secondary | ICD-10-CM

## 2020-04-27 MED ORDER — NITROGLYCERIN 0.4 MG SL SUBL: 0.4000 mg | SUBLINGUAL_TABLET | SUBLINGUAL | 3 refills | Status: DC | PRN

## 2020-04-27 MED ORDER — ESCITALOPRAM OXALATE 10 MG PO TABS: 10.0000 mg | ORAL_TABLET | Freq: Every day | ORAL | 1 refills | Status: DC

## 2020-04-27 MED ORDER — ALPRAZOLAM 0.25 MG PO TABS: ORAL_TABLET | ORAL | 0 refills | Status: AC

## 2020-04-27 NOTE — Patient Instructions (Addendum)
Check the  blood pressure 2 times a week BP GOAL is between 110/65 and  135/85. If it is consistently higher or lower, let me know See Dr. Johnsie Cancel regularly  [place], [place] back for   fasting blood work within the next few days.  Please make an appointment  Come back for checkup in 4 months     Advance Directive  Advance directives are legal documents that allow you to make decisions about your health care and medical treatment in case you become unable to communicate for yourself. Advance directives let your wishes be known to family, friends, and health care providers. Discussing and writing advance directives should happen over time rather than all at once. Advance directives can be changed and updated at any time. There are different types of advance directives, such as:  Medical power of attorney.  Living will.  Do not resuscitate (DNR) order or do not attempt resuscitation (DNAR) order. Health care proxy and medical power of attorney A health care proxy is also called a health care agent. This person is appointed to make medical decisions for you when you are unable to make decisions for yourself. Generally, people ask a trusted friend or family member to act as their proxy and represent their preferences. Make sure you have an agreement with your trusted person to act as your proxy. A proxy may have to make a medical decision on your behalf if your wishes are not known. A medical power of attorney, also called a durable power of attorney for health care, is a legal document that names your health care proxy. Depending on the laws in your state, the document may need to be:  Signed.  Notarized.  Dated.  Copied.  Witnessed.  Incorporated into your medical record. You may also want to appoint a trusted person to manage your money in the event you are unable to do so. This is called a durable power of attorney for finances. It is a  separate legal document from the durable power of attorney for health care. You may choose your health care proxy or someone different to act as your agent in money matters. If you do not appoint a proxy, or there is a concern that the proxy is not acting in your best interest, a court may appoint a guardian to act on your behalf. Living will A living will is a set of instructions that state your wishes about medical care when you cannot express them yourself. Health care providers should keep a copy of your living will in your medical record. You may want to give a copy to family members or friends. To alert caregivers in case of an emergency, you can place a card in your wallet to let them know that you have a living will and where they can find it. A living will is used if you become:  Terminally ill.  Disabled.  Unable to communicate or make decisions. The following decisions should be included in your living will:  To use or not to use life support equipment, such as dialysis machines and breathing machines (ventilators).  Whether you want a DNR or DNAR order. This tells health care providers not to use cardiopulmonary resuscitation (CPR) if breathing or heartbeat stops.  To use or not to use tube feeding.  To be given or not to be given food and fluids.  Whether you want comfort (palliative) care when the goal becomes comfort rather  than a cure.  Whether you want to donate your organs and tissues. A living will does not give instructions for distributing your money and property if you should pass away. DNR or DNAR A DNR or DNAR order is a request not to have CPR in the event that your heart stops beating or you stop breathing. If a DNR or DNAR order has not been made and shared, a health care provider will try to help any patient whose heart has stopped or who has stopped breathing. If you plan to have surgery, talk with your health care provider about how your DNR or DNAR order will  be followed if problems occur. What if I do not have an advance directive? Some states assign family decision makers to act on your behalf if you do not have an advance directive. Each state has its own laws about advance directives. You may want to check with your health care provider, attorney, or state representative about the laws in your state. Summary  Advance directives are legal documents that allow you to make decisions about your health care and medical treatment in case you become unable to communicate for yourself.  The process of discussing and writing advance directives should happen over time. You can change and update advance directives at any time.  Advance directives may include a medical power of attorney, a living will, and a DNR or DNAR order. This information is not intended to replace advice given to you by your health care provider. Make sure you discuss any questions you have with your health care provider. Document Revised: 11/24/2019 Document Reviewed: 11/24/2019 Elsevier Patient Education  2021 [employer].

## 2020-04-27 NOTE — Progress Notes (Signed)
Subjective:    Patient ID: Keith Black., male    DOB: Jul 07, 1965, 55 y.o.   MRN: [id number]  DOS:  04/27/2020 Type of visit - description: CPX Since the last office visit is doing well.  Have no major concerns   Review of Systems    A 14 point review of systems is negative    Past Medical History:  Diagnosis Date  . Allergy   . CVA (cerebrovascular accident) (HCC) 04/08/2018  . GERD (gastroesophageal reflux disease)   . High cholesterol   . History of chicken pox   . Hypertension   . LV (left ventricular) mural thrombus without MI (HCC)   . Migraine   . Ocular migraine     Past Surgical History:  Procedure Laterality Date  . HERNIA REPAIR Bilateral   . LEFT HEART CATH AND CORONARY ANGIOGRAPHY N/A 04/10/2018   Procedure: LEFT HEART CATH AND CORONARY ANGIOGRAPHY;  Surgeon: Runell Gess, MD;  Location: [facility];  Service: Cardiovascular;  Laterality: N/A;    Allergies as of 04/27/2020   No Known Allergies     Medication List       Accurate as of April 27, 2020 11:59 PM. If you have any questions, ask your nurse or doctor.        acetaminophen 325 MG tablet Commonly known as: TYLENOL Take 650 mg by mouth as needed for pain.   ALPRAZolam 0.25 MG tablet Commonly known as: XANAX TAKE 1 TABLET BY MOUTH TWICE A DAY AS NEEDED FOR ANXIETY   aspirin 81 MG EC tablet Take 1 tablet (81 mg total) by mouth daily.   atorvastatin 80 MG tablet Commonly known as: LIPITOR Take 0.5 tablets (40 mg total) by mouth at bedtime.   cetirizine 10 MG tablet Commonly known as: ZYRTEC Take 10 mg by mouth daily.   CVS VIT D 5000 HIGH-POTENCY PO Take 1 capsule by mouth daily.   escitalopram 10 MG tablet Commonly known as: LEXAPRO Take 1 tablet (10 mg total) by mouth daily.   losartan 50 MG tablet Commonly known as: COZAAR Take 1 tablet (50 mg total) by mouth daily.   metoprolol tartrate 25 MG tablet Commonly known as: LOPRESSOR Take 1 tablet (25 mg  total) by mouth 2 (two) times daily.   nitroGLYCERIN 0.4 MG SL tablet Commonly known as: NITROSTAT Place 1 tablet (0.4 mg total) under the tongue every 5 (five) minutes as needed for chest pain.   pantoprazole 40 MG tablet Commonly known as: PROTONIX Take 1 tablet (40 mg total) by mouth daily.   Praluent 150 MG/ML Soaj Generic drug: Alirocumab Inject 1 mL into the skin every 14 (fourteen) days.   valACYclovir 1000 MG tablet Commonly known as: Valtrex Take 2 tablets (2,000 mg total) by mouth 2 (two) times daily. Use for 1 day only for each cold sore outbrake What changed:   when to take this  reasons to take this  additional instructions          Objective:   Physical Exam BP (!) 146/82 (BP Location: Left Arm, Patient Position: Sitting, Cuff Size: Small)   Pulse 88   Temp 98.9 F (37.2 C) (Oral)   Resp 16   Ht 5\' 8"  (1.727 m)   Wt 170 lb 8 oz (77.3 kg)   SpO2 98%   BMI 25.92 kg/m  General: Well developed, NAD, BMI noted Neck: No  thyromegaly  HEENT:  Normocephalic . Face symmetric, atraumatic Lungs:  CTA B  Normal respiratory effort, no intercostal retractions, no accessory muscle use. Heart: RRR,  no murmur.  Abdomen:  Not distended, soft, non-tender. No rebound or rigidity.   Lower extremities: no pretibial edema bilaterally DRE: Normal sphincter tone, prostate normal, no stools. Skin: Exposed areas without rash. Not pale. Not jaundice Neurologic:  alert & oriented X3.  Speech normal, gait appropriate for age and unassisted Strength symmetric and appropriate for age.  Psych: Cognition and judgment appear intact.  Cooperative with normal attention span and concentration.  Behavior appropriate. No anxious or depressed appearing.     Assessment      Assessment (referred by Dr. Eden Emms) HTN  GERD Hyperlipidemia Asthma? Allergies?  CV: -CVA acute 04-2018, subsequently diagnosed with CHF 04-2018 -CHF EF 30%, CAD, DX 04-2018, CABG 05-2018 at  [facility] Vitamin D deficiency [facility] Neuro: -Migraines ocular migraine (?)  11-2017,  migraine, seen at the ER 02/2019. -MRI brain: Encephalomalacia suspected to be from previous injury Moved to [place]  2019. Psoriasis  PLAN Here for CPX Anxiety: Refill Lexapro and Xanax.  + stress but symptoms are controlled UDS and contract HTN: BP today slightly elevated, he stopped doing ambulatory BPs because they were all normal, will continue metoprolol, losartan, restart checking BPs, reassess in 4 months Hyperlipidemia: Started Praluent, Rx by cardiology friend, self decrease atorvastatin to 40 mg daily.  Check labs CAD: Asymptomatic.  RF nitroglycerin, recommend to see Dr. Eden Emms regularly. RTC 4 months  This visit occurred during the SARS-CoV-2 public health emergency.  Safety protocols were in place, including screening questions prior to the visit, additional usage of staff PPE, and extensive cleaning of exam room while observing appropriate contact time as indicated for disinfecting solutions.

## 2020-04-27 NOTE — Progress Notes (Signed)
Pre visit review using our clinic review tool, if applicable. No additional management support is needed unless otherwise documented below in the visit note. 

## 2020-04-28 ENCOUNTER — Encounter: Payer: Self-pay | Admitting: Internal Medicine

## 2020-04-28 NOTE — Assessment & Plan Note (Signed)
Here for CPX Anxiety: Refill Lexapro and Xanax.  + stress but symptoms are controlled UDS and contract HTN: BP today slightly elevated, he stopped doing ambulatory BPs because they were all normal, will continue metoprolol, losartan, restart checking BPs, reassess in 4 months Hyperlipidemia: Started Praluent, Rx by cardiology friend, self decrease atorvastatin to 40 mg daily.  Check labs CAD: Asymptomatic.  RF nitroglycerin, recommend to see Dr. Johnsie Cancel regularly. RTC 4 months

## 2020-04-28 NOTE — Assessment & Plan Note (Signed)
-  Tdap 07/2019 - Shingrix : Today, next when he comes back in 4 months -COVID vaccine x3 -Had a flu shot -[id number] a colonoscopy at age 55, + polyps.  Repeat colonoscopy at age 89: Normal.  Rec  to bring reports  -Prostate cancer screening: DRE normal today, recent PSA normal. -Labs: Will come back fasting for CMP, FLP, CBC, A1c - Diet and exercise discussed

## 2020-05-30 ENCOUNTER — Encounter: Payer: Self-pay | Admitting: Internal Medicine

## 2020-06-06 ENCOUNTER — Other Ambulatory Visit: Payer: Self-pay | Admitting: Internal Medicine

## 2020-06-06 MED ORDER — ATORVASTATIN CALCIUM 80 MG PO TABS: 40.0000 mg | ORAL_TABLET | Freq: Every day | ORAL | 1 refills | Status: DC

## 2020-06-10 ENCOUNTER — Other Ambulatory Visit: Payer: Self-pay

## 2020-07-07 NOTE — Progress Notes (Signed)
[facility] [address] [place] Phone: [phone] Subjective:   I Keith Black am serving as a Education administrator for Dr. Hulan Saas.  This visit occurred during the SARS-CoV-2 public health emergency.  Safety protocols were in place, including screening questions prior to the visit, additional usage of staff PPE, and extensive cleaning of exam room while observing appropriate contact time as indicated for disinfecting solutions.   I'm seeing this patient by the request  of:  Colon Branch, MD  CC: Elbow pain  [id number]  Keith Black. is a 55 y.o. male coming in with complaint of bilateral elbow pain.  Onset- chronic  Location - lateral Duration- consistent pain  Character- sore  Aggravating factors- golf, Therapies tried- ice, topical, oral Severity- 5/10 at its worse      Past Medical History:  Diagnosis Date  . Allergy   . CVA (cerebrovascular accident) ([place]) 04/08/2018  . GERD (gastroesophageal reflux disease)   . High cholesterol   . History of chicken pox   . Hypertension   . LV (left ventricular) mural thrombus without MI ([place])   . Migraine   . Ocular migraine    Past Surgical History:  Procedure Laterality Date  . HERNIA REPAIR Bilateral   . LEFT HEART CATH AND CORONARY ANGIOGRAPHY N/A 04/10/2018   Procedure: LEFT HEART CATH AND CORONARY ANGIOGRAPHY;  Surgeon: Lorretta Harp, MD;  Location: [facility];  Service: Cardiovascular;  Laterality: N/A;   Social History   Socioeconomic History  . Marital status: Married    Spouse name: Not on file  . Number of children: 0  . Years of education: Not on file  . Highest education level: Not on file  Occupational History  . 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[employer]  Tobacco Use  . Smoking status: Never Smoker  . Smokeless tobacco: Current User    Types: Snuff  Vaping Use  . Vaping Use: Never used  Substance and Sexual Activity  .  Alcohol use: Yes    Comment: socially   . Drug use: Never  . Sexual activity: Not on file  Other Topics Concern  . Not on file  Social History Narrative   Lives w/ wife in [place], moved to  [place] 2019   Social Determinants of Health   Financial Resource Strain: Not on file  Food Insecurity: Not on file  Transportation Needs: Not on file  Physical Activity: Not on file  Stress: Not on file  Social Connections: Not on file   No Known Allergies Family History  Problem Relation Age of Onset  . Allergies Mother   . Asthma Mother   . Heart disease Mother 49       had stents and then CABG  . Stroke Father 65  . Colon cancer Neg Hx   . Prostate cancer Neg Hx      Current Outpatient Medications (Cardiovascular):  .  atorvastatin (LIPITOR) 80 MG tablet, Take 0.5 tablets (40 mg total) by mouth at bedtime. Marland Kitchen  losartan (COZAAR) 50 MG tablet, Take 1 tablet (50 mg total) by mouth daily. .  metoprolol tartrate (LOPRESSOR) 25 MG tablet, Take 1 tablet (25 mg total) by mouth 2 (two) times daily. .  nitroGLYCERIN (NITROSTAT) 0.4 MG SL tablet, Place 1 tablet (0.4 mg total) under the tongue every 5 (five) minutes as needed for chest pain. Marland Kitchen  PRALUENT 150 MG/ML SOAJ, Inject 1 mL into the skin  every 14 (fourteen) days.  Current Outpatient Medications (Respiratory):  .  cetirizine (ZYRTEC) 10 MG tablet, Take 10 mg by mouth daily.  Current Outpatient Medications (Analgesics):  .  acetaminophen (TYLENOL) 325 MG tablet, Take 650 mg by mouth as needed for pain. Marland Kitchen  aspirin 81 MG EC tablet, Take 1 tablet (81 mg total) by mouth daily.   Current Outpatient Medications (Other):  Marland Kitchen  ALPRAZolam (XANAX) 0.25 MG tablet, TAKE 1 TABLET BY MOUTH TWICE A DAY AS NEEDED FOR ANXIETY .  Cholecalciferol (CVS VIT D 5000 HIGH-POTENCY PO), Take 1 capsule by mouth daily. Marland Kitchen  escitalopram (LEXAPRO) 10 MG tablet, Take 1 tablet (10 mg total) by mouth daily. .  pantoprazole (PROTONIX) 40 MG tablet, Take 1  tablet (40 mg total) by mouth daily. .  valACYclovir (VALTREX) 1000 MG tablet, Take 2 tablets (2,000 mg total) by mouth 2 (two) times daily. Use for 1 day only for each cold sore outbrake (Patient taking differently: Take 2,000 mg by mouth 2 (two) times daily as needed (outbreaks).)   Reviewed prior external information including notes and imaging from  primary care provider As well as notes that were available from care everywhere and other healthcare systems.  Past medical history, social, surgical and family history all reviewed in electronic medical record.  No pertanent information unless stated regarding to the chief complaint.   Review of Systems:  No headache, visual changes, nausea, vomiting, diarrhea, constipation, dizziness, abdominal pain, skin rash, fevers, chills, night sweats, weight loss, swollen lymph nodes, body aches, joint swelling, chest pain, shortness of breath, mood changes. POSITIVE muscle aches  Objective  Blood pressure 120/90, pulse 78, height 5\' 8"  (1.727 m), weight 171 lb (77.6 kg), SpO2 98 %.   General: No apparent distress alert and oriented x3 mood and affect normal, dressed appropriately.  HEENT: Pupils equal, extraocular movements intact  Respiratory: Patient's speak in full sentences and does not appear short of breath  Cardiovascular: No lower extremity edema, non tender, no erythema  Gait normal with good balance and coordination.  MSK:   Elbow exam shows patient does have some mild tenderness to palpation over the lateral epicondylar region right greater than left.  Mild pain with resisted wrist extension.  Good grip strength noted. Neck exam shows no loss of lordosis.  Good range of motion.  Negative Spurling's.  5-5 strength of the upper extremities.  Limited musculoskeletal ultrasound was performed and interpreted by Keith Black  Limited ultrasound of patient's elbow shows patient has a some very mild hypoechoic changes at the common extensor  tendon near the lateral epicondylar region.  Very mild increase in neovascularization.  No true retraction noted.  Radial nerve unremarkable.  Patient's radial head unremarkable. Impression: Mild lateral epicondylitis    Impression and Recommendations:     The above documentation has been reviewed and is accurate and complete Keith Pulley, DO

## 2020-07-08 ENCOUNTER — Ambulatory Visit (INDEPENDENT_AMBULATORY_CARE_PROVIDER_SITE_OTHER): Payer: 59 | Admitting: Family Medicine

## 2020-07-08 ENCOUNTER — Ambulatory Visit: Payer: 59 | Admitting: Family Medicine

## 2020-07-08 ENCOUNTER — Other Ambulatory Visit: Payer: Self-pay

## 2020-07-08 ENCOUNTER — Encounter: Payer: Self-pay | Admitting: Family Medicine

## 2020-07-08 ENCOUNTER — Ambulatory Visit: Payer: Self-pay

## 2020-07-08 VITALS — BP 120/90 | HR 78 | Ht 68.0 in | Wt 171.0 lb

## 2020-07-08 DIAGNOSIS — M25522 Pain in left elbow: Secondary | ICD-10-CM | POA: Diagnosis not present

## 2020-07-08 DIAGNOSIS — M25521 Pain in right elbow: Secondary | ICD-10-CM

## 2020-07-08 DIAGNOSIS — M7710 Lateral epicondylitis, unspecified elbow: Secondary | ICD-10-CM | POA: Insufficient documentation

## 2020-07-08 DIAGNOSIS — M7711 Lateral epicondylitis, right elbow: Secondary | ICD-10-CM

## 2020-07-08 DIAGNOSIS — M7712 Lateral epicondylitis, left elbow: Secondary | ICD-10-CM | POA: Diagnosis not present

## 2020-07-08 NOTE — Patient Instructions (Addendum)
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

## 2020-07-08 NOTE — Assessment & Plan Note (Signed)
Patient does have more lateral epicondylitis.  This seems to be more on the right elbow than the left.  We discussed different changes.  Patient has been working at home and we discussed ergonomic changes that could be beneficial.  We discussed the possibility of a wrist brace.  Icing regimen and home exercises.  We discussed avoiding certain activities otherwise such as overhead lifting.  Patient will follow up with me again in 6 weeks.  Worsening pain consider the possibility of physical therapy

## 2020-07-09 ENCOUNTER — Other Ambulatory Visit: Payer: Self-pay | Admitting: Internal Medicine

## 2020-07-29 ENCOUNTER — Other Ambulatory Visit: Payer: Self-pay | Admitting: Internal Medicine

## 2020-08-17 ENCOUNTER — Ambulatory Visit: Payer: 59 | Admitting: Internal Medicine

## 2020-08-30 ENCOUNTER — Other Ambulatory Visit: Payer: Self-pay | Admitting: Internal Medicine

## 2020-09-07 NOTE — Progress Notes (Deleted)
[place] Phone: [phone] Subjective:    I'm seeing this patient by the request  of:  Colon Branch, MD  CC:   [id number]  07/08/2020 Patient does have more lateral epicondylitis.  This seems to be more on the right elbow than the left.  We discussed different changes.  Patient has been working at home and we discussed ergonomic changes that could be beneficial.  We discussed the possibility of a wrist brace.  Icing regimen and home exercises.  We discussed avoiding certain activities otherwise such as overhead lifting.  Patient will follow up with me again in 6 weeks.  Worsening pain consider the possibility of physical therapy  Update 09/15/2020 Keith Black. is a 55 y.o. male coming in with complaint of B elbow pain.        Past Medical History:  Diagnosis Date   Allergy    CVA (cerebrovascular accident) ([place]) 04/08/2018   GERD (gastroesophageal reflux disease)    High cholesterol    History of chicken pox    Hypertension    LV (left ventricular) mural thrombus without MI ([place])    Migraine    Ocular migraine    Past Surgical History:  Procedure Laterality Date   HERNIA REPAIR Bilateral    LEFT HEART CATH AND CORONARY ANGIOGRAPHY N/A 04/10/2018   Procedure: LEFT HEART CATH AND CORONARY ANGIOGRAPHY;  Surgeon: Lorretta Harp, MD;  Location: [facility];  Service: Cardiovascular;  Laterality: N/A;   Social History   Socioeconomic History   Marital status: Married    Spouse name: Not on file   Number of children: 0   Years of education: Not on file   Highest education level: Not on file  Occupational History   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[employer]  Tobacco Use   Smoking status: Never   Smokeless tobacco: Current    Types: Snuff  Vaping Use   Vaping Use: Never used  Substance and Sexual Activity   Alcohol use: Yes    Comment: socially    Drug use: Never    Sexual activity: Not on file  Other Topics Concern   Not on file  Social History Narrative   Lives w/ wife in [place], moved to  [place]   Social Determinants of Health   Financial Resource Strain: Not on file  Food Insecurity: Not on file  Transportation Needs: Not on file  Physical Activity: Not on file  Stress: Not on file  Social Connections: Not on file   No Known Allergies Family History  Problem Relation Age of Onset   Allergies Mother    Asthma Mother    Heart disease Mother 95       had stents and then CABG   Stroke Father 73   Colon cancer Neg Hx    Prostate cancer Neg Hx      Current Outpatient Medications (Cardiovascular):    atorvastatin (LIPITOR) 80 MG tablet, Take 0.5 tablets (40 mg total) by mouth at bedtime.   losartan (COZAAR) 50 MG tablet, Take 1 tablet (50 mg total) by mouth daily.   metoprolol tartrate (LOPRESSOR) 25 MG tablet, TAKE 1 TABLET BY MOUTH TWICE A DAY   nitroGLYCERIN (NITROSTAT) 0.4 MG SL tablet, Place 1 tablet (0.4 mg total) under the tongue every 5 (five) minutes x 3 doses as needed for chest pain.   PRALUENT 150 MG/ML SOAJ, Inject 1  mL into the skin every 14 (fourteen) days.  Current Outpatient Medications (Respiratory):    cetirizine (ZYRTEC) 10 MG tablet, Take 10 mg by mouth daily.  Current Outpatient Medications (Analgesics):    acetaminophen (TYLENOL) 325 MG tablet, Take 650 mg by mouth as needed for pain.   aspirin 81 MG EC tablet, Take 1 tablet (81 mg total) by mouth daily.   Current Outpatient Medications (Other):    ALPRAZolam (XANAX) 0.25 MG tablet, TAKE 1 TABLET BY MOUTH TWICE A DAY AS NEEDED FOR ANXIETY   Cholecalciferol (CVS VIT D 5000 HIGH-POTENCY PO), Take 1 capsule by mouth daily.   escitalopram (LEXAPRO) 10 MG tablet, TAKE 1 TABLET BY MOUTH EVERY DAY   pantoprazole (PROTONIX) 40 MG tablet, Take 1 tablet (40 mg total) by mouth daily.   valACYclovir (VALTREX) 1000 MG tablet, Take 2 tablets (2,000 mg  total) by mouth 2 (two) times daily. Use for 1 day only for each cold sore outbrake (Patient taking differently: Take 2,000 mg by mouth 2 (two) times daily as needed (outbreaks).)   Reviewed prior external information including notes and imaging from  primary care provider As well as notes that were available from care everywhere and other healthcare systems.  Past medical history, social, surgical and family history all reviewed in electronic medical record.  No pertanent information unless stated regarding to the chief complaint.   Review of Systems:  No headache, visual changes, nausea, vomiting, diarrhea, constipation, dizziness, abdominal pain, skin rash, fevers, chills, night sweats, weight loss, swollen lymph nodes, body aches, joint swelling, chest pain, shortness of breath, mood changes. POSITIVE muscle aches  Objective  There were no vitals taken for this visit.   General: No apparent distress alert and oriented x3 mood and affect normal, dressed appropriately.  HEENT: Pupils equal, extraocular movements intact  Respiratory: Patient's speak in full sentences and does not appear short of breath  Cardiovascular: No lower extremity edema, non tender, no erythema  Gait normal with good balance and coordination.  MSK:  Non tender with full range of motion and good stability and symmetric strength and tone of shoulders, elbows, wrist, hip, knee and ankles bilaterally.     Impression and Recommendations:     The above documentation has been reviewed and is accurate and complete Jacqualin Combes

## 2020-09-15 ENCOUNTER — Ambulatory Visit: Payer: 59 | Admitting: Family Medicine

## 2020-09-15 ENCOUNTER — Encounter: Payer: Self-pay | Admitting: Internal Medicine

## 2020-09-19 ENCOUNTER — Ambulatory Visit: Payer: 59 | Admitting: Internal Medicine

## 2020-10-02 ENCOUNTER — Other Ambulatory Visit: Payer: Self-pay | Admitting: Internal Medicine

## 2020-10-07 ENCOUNTER — Ambulatory Visit: Payer: 59 | Admitting: Internal Medicine

## 2020-10-13 ENCOUNTER — Ambulatory Visit: Payer: 59 | Admitting: Family Medicine

## 2020-10-17 NOTE — Progress Notes (Signed)
[place] [facility] Phone: [phone] Subjective:   Keith Black, am serving as a scribe for Dr. Hulan Saas.  This visit occurred during the SARS-CoV-2 public health emergency.  Safety protocols were in place, including screening questions prior to the visit, additional usage of staff PPE, and extensive cleaning of exam room while observing appropriate contact time as indicated for disinfecting solutions.   I'm seeing this patient by the request  of:  Colon Branch, MD  CC: Elbow pain follow-up, knee pain  [id number]  07/08/2020 Patient does have more lateral epicondylitis.  This seems to be more on the right elbow than the left.  We discussed different changes.  Patient has been working at home and we discussed ergonomic changes that could be beneficial.  We discussed the possibility of a wrist brace.  Icing regimen and home exercises.  We discussed avoiding certain activities otherwise such as overhead lifting.  Patient will follow up with me again in 6 weeks.  Worsening pain consider the possibility of physical therapy  Update 10/18/2020 Keith Black Keith Black. is a 55 y.o. male coming in with complaint of B elbow pain. Patient states that he is longer having pain.   Notes having R knee pain, medial aspect with walking longer distances. Feels unstable. Black true injury.  Patient states that it initially started about 3 weeks ago.  Took a week and a half off from walking and seemed to completely resolve.  Started walking again and started having the pain again.  Patient is concerned because he is traveling and will be doing a lot of hiking.      Past Medical History:  Diagnosis Date   Allergy    CVA (cerebrovascular accident) ([place]) 04/08/2018   GERD (gastroesophageal reflux disease)    High cholesterol    History of chicken pox    Hypertension    LV (left ventricular) mural thrombus without MI ([place])    Migraine    Ocular  migraine    Past Surgical History:  Procedure Laterality Date   HERNIA REPAIR Bilateral    LEFT HEART CATH AND CORONARY ANGIOGRAPHY N/A 04/10/2018   Procedure: LEFT HEART CATH AND CORONARY ANGIOGRAPHY;  Surgeon: Lorretta Harp, MD;  Location: [facility];  Service: Cardiovascular;  Laterality: N/A;   Social History   Socioeconomic History   Marital status: Married    Spouse name: Not on file   Number of children: 0   Years of education: Not on file   Highest education level: Not on file  Occupational History   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[employer]  Tobacco Use   Smoking status: Never   Smokeless tobacco: Current    Types: Snuff  Vaping Use   Vaping Use: Never used  Substance and Sexual Activity   Alcohol use: Yes    Comment: socially    Drug use: Never   Sexual activity: Not on file  Other Topics Concern   Not on file  Social History Narrative   Lives w/ wife in [place], moved to  [place]   Social Determinants of Health   Financial Resource Strain: Not on file  Food Insecurity: Not on file  Transportation Needs: Not on file  Physical Activity: Not on file  Stress: Not on file  Social Connections: Not on file   Black Known Allergies Family History  Problem Relation Age of Onset   Allergies Mother  Asthma Mother    Heart disease Mother 39       had stents and then CABG   Stroke Father 29   Colon cancer Neg Hx    Prostate cancer Neg Hx      Current Outpatient Medications (Cardiovascular):    atorvastatin (LIPITOR) 80 MG tablet, Take 0.5 tablets (40 mg total) by mouth at bedtime.   losartan (COZAAR) 50 MG tablet, Take 1 tablet (50 mg total) by mouth daily.   metoprolol tartrate (LOPRESSOR) 25 MG tablet, TAKE 1 TABLET BY MOUTH TWICE A DAY   nitroGLYCERIN (NITROSTAT) 0.4 MG SL tablet, Place 1 tablet (0.4 mg total) under the tongue every 5 (five) minutes x 3 doses as needed for chest pain.   PRALUENT 150 MG/ML SOAJ,  Inject 1 mL into the skin every 14 (fourteen) days.  Current Outpatient Medications (Respiratory):    cetirizine (ZYRTEC) 10 MG tablet, Take 10 mg by mouth daily.  Current Outpatient Medications (Analgesics):    acetaminophen (TYLENOL) 325 MG tablet, Take 650 mg by mouth as needed for pain.   aspirin 81 MG EC tablet, Take 1 tablet (81 mg total) by mouth daily.   meloxicam (MOBIC) 7.5 MG tablet, Take 1 tablet (7.5 mg total) by mouth daily.   Current Outpatient Medications (Other):    ALPRAZolam (XANAX) 0.25 MG tablet, TAKE 1 TABLET BY MOUTH TWICE A DAY AS NEEDED FOR ANXIETY   Cholecalciferol (CVS VIT D 5000 HIGH-POTENCY PO), Take 1 capsule by mouth daily.   escitalopram (LEXAPRO) 10 MG tablet, TAKE 1 TABLET BY MOUTH EVERY DAY   pantoprazole (PROTONIX) 40 MG tablet, Take 1 tablet (40 mg total) by mouth daily.   valACYclovir (VALTREX) 1000 MG tablet, Take 2 tablets (2,000 mg total) by mouth 2 (two) times daily. Use for 1 day only for each cold sore outbrake (Patient taking differently: Take 2,000 mg by mouth 2 (two) times daily as needed (outbreaks).)   Reviewed prior external information including notes and imaging from  primary care provider As well as notes that were available from care everywhere and other healthcare systems.  Past medical history, social, surgical and family history all reviewed in electronic medical record.  Black pertanent information unless stated regarding to the chief complaint.   Review of Systems:  Black headache, visual changes, nausea, vomiting, diarrhea, constipation, dizziness, abdominal pain, skin rash, fevers, chills, night sweats, weight loss, swollen lymph nodes, body aches, joint swelling, chest pain, shortness of breath, mood changes. POSITIVE muscle aches  Objective  Blood pressure (!) 124/98, pulse 67, height '5\' 8"'$  (1.727 m), weight 174 lb (78.9 kg), SpO2 98 %.   General: Black apparent distress alert and oriented x3 mood and affect normal, dressed  appropriately.  HEENT: Pupils equal, extraocular movements intact  Respiratory: Patient's speak in full sentences and does not appear short of breath  Cardiovascular: Black lower extremity edema, non tender, Black erythema  Gait normal with good balance and coordination.  MSK: Right knee exam shows the patient has good range of motion.  Some mild tenderness over the medial joint line but never over the pes anserine area.  Mild tightness of the hamstring compared to the contralateral side.  Negative straight leg test.  Black instability noted with valgus or varus force.  Limited muscular skeletal ultrasound was performed and interpreted by Hulan Saas, M  Limited ultrasound shows there is Black significant swelling of the patellofemoral joint.  Patient does have a mild degenerative tear of the medial meniscus but Black  significant displacement noted.  Patient does have hypoechoic changes and increasing Doppler flow at the insertion of the hamstring tendon at the pes anserine area. Impression: Distal hamstring tendinitis    Impression and Recommendations:     The above documentation has been reviewed and is accurate and complete Lyndal Pulley, DO

## 2020-10-18 ENCOUNTER — Encounter: Payer: Self-pay | Admitting: Family Medicine

## 2020-10-18 ENCOUNTER — Ambulatory Visit (INDEPENDENT_AMBULATORY_CARE_PROVIDER_SITE_OTHER): Payer: 59 | Admitting: Family Medicine

## 2020-10-18 ENCOUNTER — Other Ambulatory Visit: Payer: Self-pay

## 2020-10-18 ENCOUNTER — Ambulatory Visit: Payer: Self-pay

## 2020-10-18 VITALS — BP 124/98 | HR 67 | Ht 68.0 in | Wt 174.0 lb

## 2020-10-18 DIAGNOSIS — M25522 Pain in left elbow: Secondary | ICD-10-CM

## 2020-10-18 DIAGNOSIS — M7050 Other bursitis of knee, unspecified knee: Secondary | ICD-10-CM

## 2020-10-18 DIAGNOSIS — M25521 Pain in right elbow: Secondary | ICD-10-CM

## 2020-10-18 MED ORDER — MELOXICAM 7.5 MG PO TABS: 7.5000 mg | ORAL_TABLET | Freq: Every day | ORAL | 0 refills | Status: DC

## 2020-10-18 NOTE — Assessment & Plan Note (Signed)
Pes anserine with what appears to be more of a inflammation with distal hamstring tearing.  Discussed proper shoes, we discussed home exercises.  Discussed diet compression sleeve.  Given a short course of anti-inflammatories.  Patient will monitor.  Discussed using the anti-inflammatory for 10 days then as needed.  Patient knows to watch for certain side effects.  Follow-up with me again in 6 weeks.

## 2020-10-18 NOTE — Patient Instructions (Addendum)
Exercises Meloxicam 7.'5mg'$  for 10 days then as  needed  Ice for 20 min  HOKA Arahi or Brooks Adrenaline Shorten stride length Thigh compression sleeve Have f/u in 6 weeks

## 2020-11-08 IMAGING — MR MR CARD MORPHOLOGY WO/W CM
18 of 20 series · 38 of 40 positions shown · IV contrast (Gadavist)
Comparison: none

CLINICAL DATA: Stroke LV apical Thrombus

EXAM:
CARDIAC MRI
TECHNIQUE: The patient was scanned on a 1.5 Tesla GE magnet. A dedicated
cardiac coil was used. Functional imaging was done using Fiesta
sequences. [DATE], and 4 chamber views were done to assess for RWMA's.
Modified Awa rule using a short axis stack was used to
calculate an ejection fraction on a dedicated work station using
Circle software. The patient received L cc of Multihance. After 10
minutes inversion recovery sequences were used to assess for
infiltration and scar tissue.
CONTRAST:  Gadavist

[Series 6: bSSFP · axial · 8.0mm · 1.61mm/px · z∈[-196,-143]mm · 6 of 225 slices shown (1 of 6)]
[im 1/225]
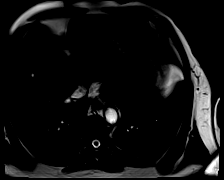
[im 45/225]
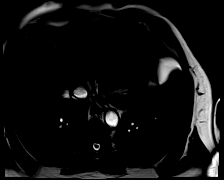
[im 90/225]
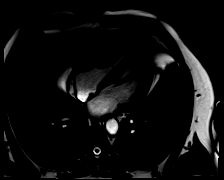
[im 135/225]
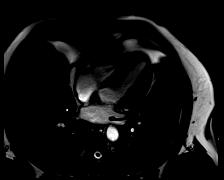
[im 180/225]
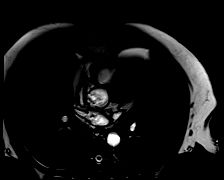
[im 225/225]
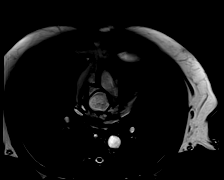

[Series 7: bSSFP · oblique · 8.0mm · 1.61mm/px · 13 of 375 slices shown (2 of 6)]
[im 1/375]
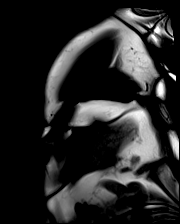
[im 32/375]
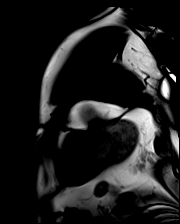
[im 63/375]
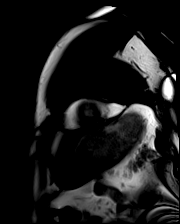
[im 94/375]
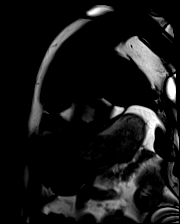
[im 125/375]
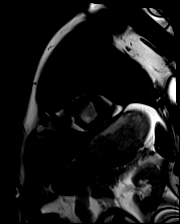
[im 156/375]
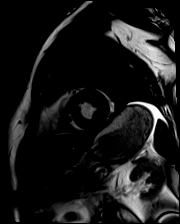
[im 188/375]
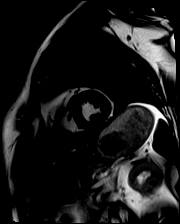
[im 219/375]
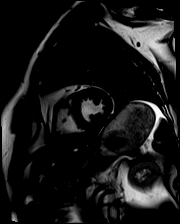
[im 250/375]
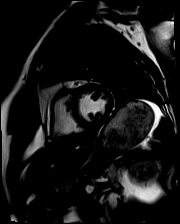
[im 281/375]
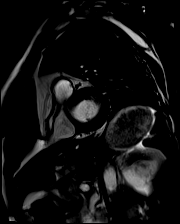
[im 312/375]
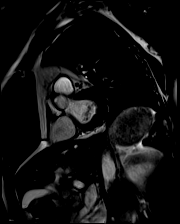
[im 343/375]
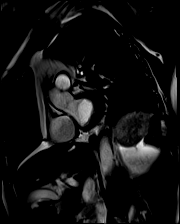
[im 375/375]
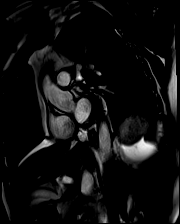

[Series 8: bSSFP · oblique · 8.0mm · 1.61mm/px · 4 of 175 slices shown (3 of 6)]
[im 1/175]
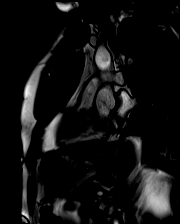
[im 59/175]
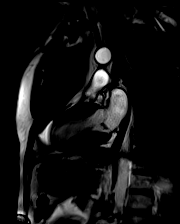
[im 117/175]
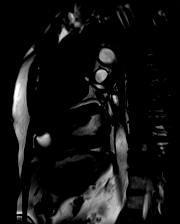
[im 175/175]
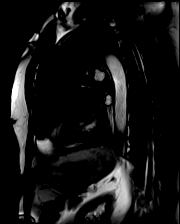

[Series 9: bSSFP · axial · 7.0mm · 1.41mm/px · 1 of 25 slices shown (4 of 6)]
[im 1/25]
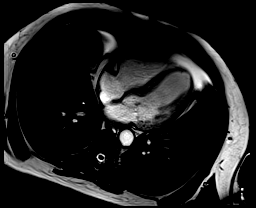

[Series 10: bSSFP · oblique · 7.0mm · 1.41mm/px · 1 of 25 slices shown (5 of 6)]
[im 1/25]
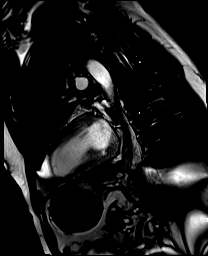

[Series 11: bSSFP · oblique · 7.0mm · 1.41mm/px · 1 of 25 slices shown (6 of 6)]
[im 1/25]
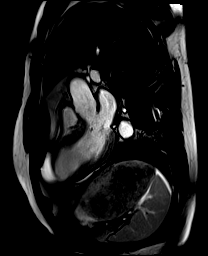

[Series 14: lge_single shot sa · oblique · 8.0mm · 1.77mm/px · 1 of 15 slices shown (1 of 2)]
[im 1/15]
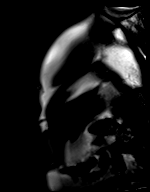

[Series 15: lge_single shot sa · oblique · 8.0mm · 1.77mm/px · 1 of 15 slices shown (2 of 2)]
[im 1/15]
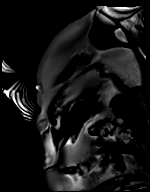

[Series 16: lge_single shot radial_mag · axial · 7.0mm · 1.98mm/px · 1 of 1 slices shown (1 of 2)]
[im 1/1]
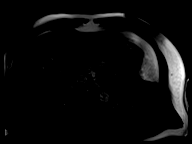

[Series 17: lge_single shot radial_psir · axial · 7.0mm · 1.98mm/px · 1 of 1 slices shown (1 of 2)]
[im 1/1]
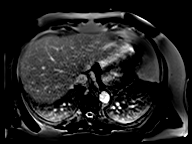

[Series 23: lge short axis_mag · oblique · 8.0mm · 1.61mm/px · 1 of 12 slices shown]
[im 1/12]
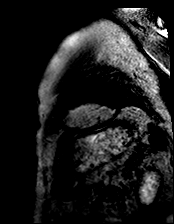

[Series 24: lge short axis_psir · oblique · 8.0mm · 1.61mm/px · 1 of 12 slices shown]
[im 1/12]
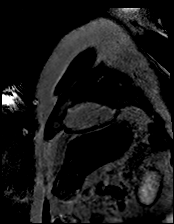

[Series 25: lge_single shot radial_mag · axial · 7.0mm · 1.98mm/px · 1 of 1 slices shown (2 of 2)]
[im 1/1]
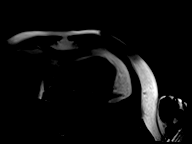

[Series 26: lge_single shot radial_psir · axial · 7.0mm · 1.98mm/px · 1 of 1 slices shown (2 of 2)]
[im 1/1]
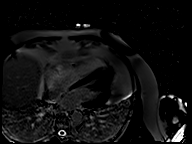

[Series 31: lge radial ((date)ch)_mag · axial · 7.0mm · 1.61mm/px · 1 of 1 slices shown]
[im 1/1]
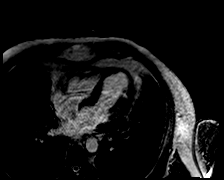

[Series 32: lge radial ((date)ch)_psir · axial · 7.0mm · 1.61mm/px · 1 of 1 slices shown]
[im 1/1]
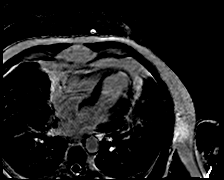

[Series 39: lge sa bh_mag · oblique · 8.0mm · 1.98mm/px · 1 of 9 slices shown]
[im 1/9]
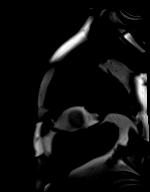

[Series 40: lge sa bh_psir · oblique · 8.0mm · 1.98mm/px · 1 of 9 slices shown]
[im 1/9]
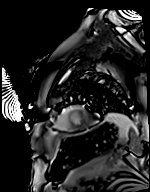

[38 of 40 positions shown; findings below may reference images not displayed]

FINDINGS: Atria were of normal sized. Normal aortic root 3.0 cm. No ASD/PFO.
No pericardial effusion Normal RV size and function Normal MV, AV
and TV. LV is mildly dilated. The distal anterior wall, apex and
distal septum are thinned and akinetic. There is swirling of blood
at the apex but no formed thrombus on post gadolinium long TI
images. Quantitative EF is 33% (EDV 99 cc ESV 66 cc SV 33 cc)
Delayed enhancement images with gadavist showed no transmural scar
in the left ventricular myocardium
IMPRESSION: 1.  Mild LVE EF 33% distal anterior wall, apical and septal akinesis

2.  No mural apical thrombus

3.  No transmural scar seen in LV myocardium

4.  Normal RV size and function

Munjoh Papy

## 2020-11-17 ENCOUNTER — Telehealth: Payer: Self-pay

## 2020-11-17 ENCOUNTER — Ambulatory Visit: Payer: 59 | Admitting: Internal Medicine

## 2020-11-17 ENCOUNTER — Encounter: Payer: Self-pay | Admitting: Internal Medicine

## 2020-11-17 NOTE — Telephone Encounter (Signed)
Please a start the process

## 2020-11-17 NOTE — Telephone Encounter (Signed)
Multiple cancellations of appts, would you like to begin dismissal process?   His new Pt appt was 04/25/2018   He has cancelled on:   06/04/2018 07/08/2018 04/15/2019 05/06/2019 10/09/2019 04/07/2020 04/20/2020 08/17/2020 09/19/2020 10/07/2020 11/17/2020  A letter was sent to him on 04/07/2020 regarding these cancelled appts. Per Estill Bamberg on 04/07/20 if he cancelled 1 more time okay to dismiss if okay w/ you.

## 2020-11-24 ENCOUNTER — Telehealth: Payer: Self-pay | Admitting: Internal Medicine

## 2020-11-24 NOTE — Telephone Encounter (Signed)
Requesting TOC appointment   Please advise

## 2020-11-25 NOTE — Telephone Encounter (Signed)
Ok

## 2020-11-30 NOTE — Progress Notes (Deleted)
[place] Phone: [phone] Subjective:    I'm seeing this patient by the request  of:  Colon Branch, MD  CC:   [id number]  10/18/2020 Pes anserine with what appears to be more of a inflammation with distal hamstring tearing.  Discussed proper shoes, we discussed home exercises.  Discussed diet compression sleeve.  Given a short course of anti-inflammatories.  Patient will monitor.  Discussed using the anti-inflammatory for 10 days then as needed.  Patient knows to watch for certain side effects.  Follow-up with me again in 6 weeks.  Update 12/01/2020 Keith Black Keith Black. is a 55 y.o. male coming in with complaint of R knee pain. Patient states       Past Medical History:  Diagnosis Date   Allergy    CVA (cerebrovascular accident) ([place]) 04/08/2018   GERD (gastroesophageal reflux disease)    High cholesterol    History of chicken pox    Hypertension    LV (left ventricular) mural thrombus without MI ([place])    Migraine    Ocular migraine    Past Surgical History:  Procedure Laterality Date   HERNIA REPAIR Bilateral    LEFT HEART CATH AND CORONARY ANGIOGRAPHY N/A 04/10/2018   Procedure: LEFT HEART CATH AND CORONARY ANGIOGRAPHY;  Surgeon: Lorretta Harp, MD;  Location: [facility];  Service: Cardiovascular;  Laterality: N/A;   Social History   Socioeconomic History   Marital status: Married    Spouse name: Not on file   Number of children: 0   Years of education: Not on file   Highest education level: Not on file  Occupational History   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[employer]  Tobacco Use   Smoking status: Never   Smokeless tobacco: Current    Types: Snuff  Vaping Use   Vaping Use: Never used  Substance and Sexual Activity   Alcohol use: Yes    Comment: socially    Drug use: Never   Sexual activity: Not on file  Other Topics Concern   Not on file  Social History  Narrative   Lives w/ wife in [place], moved to  [place]   Social Determinants of Health   Financial Resource Strain: Not on file  Food Insecurity: Not on file  Transportation Needs: Not on file  Physical Activity: Not on file  Stress: Not on file  Social Connections: Not on file   No Known Allergies Family History  Problem Relation Age of Onset   Allergies Mother    Asthma Mother    Heart disease Mother 30       had stents and then CABG   Stroke Father 74   Colon cancer Neg Hx    Prostate cancer Neg Hx      Current Outpatient Medications (Cardiovascular):    atorvastatin (LIPITOR) 80 MG tablet, Take 0.5 tablets (40 mg total) by mouth at bedtime.   losartan (COZAAR) 50 MG tablet, Take 1 tablet (50 mg total) by mouth daily.   metoprolol tartrate (LOPRESSOR) 25 MG tablet, TAKE 1 TABLET BY MOUTH TWICE A DAY   nitroGLYCERIN (NITROSTAT) 0.4 MG SL tablet, Place 1 tablet (0.4 mg total) under the tongue every 5 (five) minutes x 3 doses as needed for chest pain.   PRALUENT 150 MG/ML SOAJ, Inject 1 mL into the skin every 14 (fourteen) days.  Current Outpatient Medications (Respiratory):    cetirizine (ZYRTEC)  10 MG tablet, Take 10 mg by mouth daily.  Current Outpatient Medications (Analgesics):    acetaminophen (TYLENOL) 325 MG tablet, Take 650 mg by mouth as needed for pain.   aspirin 81 MG EC tablet, Take 1 tablet (81 mg total) by mouth daily.   meloxicam (MOBIC) 7.5 MG tablet, Take 1 tablet (7.5 mg total) by mouth daily.   Current Outpatient Medications (Other):    ALPRAZolam (XANAX) 0.25 MG tablet, TAKE 1 TABLET BY MOUTH TWICE A DAY AS NEEDED FOR ANXIETY   Cholecalciferol (CVS VIT D 5000 HIGH-POTENCY PO), Take 1 capsule by mouth daily.   escitalopram (LEXAPRO) 10 MG tablet, TAKE 1 TABLET BY MOUTH EVERY DAY   pantoprazole (PROTONIX) 40 MG tablet, Take 1 tablet (40 mg total) by mouth daily.   valACYclovir (VALTREX) 1000 MG tablet, Take 2 tablets (2,000 mg total) by  mouth 2 (two) times daily. Use for 1 day only for each cold sore outbrake (Patient taking differently: Take 2,000 mg by mouth 2 (two) times daily as needed (outbreaks).)   Reviewed prior external information including notes and imaging from  primary care provider As well as notes that were available from care everywhere and other healthcare systems.  Past medical history, social, surgical and family history all reviewed in electronic medical record.  No pertanent information unless stated regarding to the chief complaint.   Review of Systems:  No headache, visual changes, nausea, vomiting, diarrhea, constipation, dizziness, abdominal pain, skin rash, fevers, chills, night sweats, weight loss, swollen lymph nodes, body aches, joint swelling, chest pain, shortness of breath, mood changes. POSITIVE muscle aches  Objective  There were no vitals taken for this visit.   General: No apparent distress alert and oriented x3 mood and affect normal, dressed appropriately.  HEENT: Pupils equal, extraocular movements intact  Respiratory: Patient's speak in full sentences and does not appear short of breath  Cardiovascular: No lower extremity edema, non tender, no erythema  Gait normal with good balance and coordination.  MSK:  Non tender with full range of motion and good stability and symmetric strength and tone of shoulders, elbows, wrist, hip, knee and ankles bilaterally.     Impression and Recommendations:     The above documentation has been reviewed and is accurate and complete Jacqualin Combes

## 2020-12-01 ENCOUNTER — Ambulatory Visit: Payer: 59 | Admitting: Family Medicine

## 2020-12-06 ENCOUNTER — Other Ambulatory Visit: Payer: Self-pay | Admitting: Internal Medicine

## 2020-12-06 ENCOUNTER — Encounter: Payer: Self-pay | Admitting: Internal Medicine

## 2020-12-07 ENCOUNTER — Ambulatory Visit: Payer: Self-pay | Admitting: Internal Medicine

## 2021-01-17 ENCOUNTER — Other Ambulatory Visit: Payer: Self-pay | Admitting: Internal Medicine

## 2021-02-09 ENCOUNTER — Other Ambulatory Visit: Payer: Self-pay

## 2021-02-09 ENCOUNTER — Ambulatory Visit (INDEPENDENT_AMBULATORY_CARE_PROVIDER_SITE_OTHER): Payer: 59

## 2021-02-09 ENCOUNTER — Ambulatory Visit (INDEPENDENT_AMBULATORY_CARE_PROVIDER_SITE_OTHER): Payer: 59 | Admitting: Family Medicine

## 2021-02-09 ENCOUNTER — Ambulatory Visit: Payer: 59 | Admitting: Podiatry

## 2021-02-09 VITALS — BP 124/82 | HR 98 | Ht 68.0 in | Wt 180.0 lb

## 2021-02-09 DIAGNOSIS — M79675 Pain in left toe(s): Secondary | ICD-10-CM

## 2021-02-09 DIAGNOSIS — M109 Gout, unspecified: Secondary | ICD-10-CM | POA: Diagnosis not present

## 2021-02-09 LAB — BASIC METABOLIC PANEL
BUN: 18 mg/dL (ref 6–23)
CO2: 26 mEq/L (ref 19–32)
Calcium: 8.8 mg/dL (ref 8.4–10.5)
Chloride: 100 mEq/L (ref 96–112)
Creatinine, Ser: 1.23 mg/dL (ref 0.40–1.50)
GFR: 66.13 mL/min (ref >60.00)
Glucose, Bld: 100 mg/dL — ABNORMAL HIGH (ref 70–99)
Potassium: 4.4 mEq/L (ref 3.5–5.1)
Sodium: 135 mEq/L (ref 135–145)

## 2021-02-09 LAB — URIC ACID: Uric Acid, Serum: 7.3 mg/dL (ref 4.0–7.8)

## 2021-02-09 MED ORDER — COLCHICINE 0.6 MG PO TABS: 0.6000 mg | ORAL_TABLET | Freq: Every day | ORAL | 2 refills | Status: DC | PRN

## 2021-02-09 MED ORDER — HYDROCODONE-ACETAMINOPHEN 5-325 MG PO TABS
1.0000 | ORAL_TABLET | Freq: Four times a day (QID) | ORAL | 0 refills | Status: AC | PRN
Start: 2021-02-09 — End: ?

## 2021-02-09 NOTE — Patient Instructions (Signed)
Thank you for coming in today.   Please get an Xray today before you leave   Please get labs today before you leave   Take the colchicine daily for at last a week.   Use the hydrocodone for severe pain as needed .   Please go to [facility] supply to get the cam walker boot we talked about today. You may also be able to get it from [employer].    Recheck in about 2 weeks.   Gout Gout is a condition that causes painful swelling of the joints. Gout is a type of inflammation of the joints (arthritis). This condition is caused by having too much uric acid in the body. Uric acid is a chemical that forms when the body breaks down substances called purines. Purines are important for building body proteins. When the body has too much uric acid, sharp crystals can form and build up inside the joints. This causes pain and swelling. Gout attacks can happen quickly and may be very painful (acute gout). Over time, the attacks can affect more joints and become more frequent (chronic gout). Gout can also cause uric acid to build up under the skin and inside the kidneys. What are the causes? This condition is caused by too much uric acid in your blood. This can happen because: Your kidneys do not remove enough uric acid from your blood. This is the most common cause. Your body makes too much uric acid. This can happen with some cancers and cancer treatments. It can also occur if your body is breaking down too many red blood cells (hemolytic anemia). You eat too many foods that are high in purines. These foods include organ meats and some seafood. Alcohol, especially beer, is also high in purines. A gout attack may be triggered by trauma or stress. What increases the risk? You are more likely to develop this condition if you: Have a family history of gout. Are male and middle-aged. Are male and have gone through menopause. Are obese. Frequently drink alcohol, especially beer. Are dehydrated. Lose weight  too quickly. Have an organ transplant. Have lead poisoning. Take certain medicines, including aspirin, cyclosporine, diuretics, levodopa, and niacin. Have kidney disease. Have a skin condition called psoriasis. What are the signs or symptoms? An attack of acute gout happens quickly. It usually occurs in just one joint. The most common place is the big toe. Attacks often start at night. Other joints that may be affected include joints of the feet, ankle, knee, fingers, wrist, or elbow. Symptoms of this condition may include: Severe pain. Warmth. Swelling. Stiffness. Tenderness. The affected joint may be very painful to touch. Shiny, red, or purple skin. Chills and fever. Chronic gout may cause symptoms more frequently. More joints may be involved. You may also have white or yellow lumps (tophi) on your hands or feet or in other areas near your joints. How is this diagnosed? This condition is diagnosed based on your symptoms, medical history, and physical exam. You may have tests, such as: Blood tests to measure uric acid levels. Removal of joint fluid with a thin needle (aspiration) to look for uric acid crystals. X-rays to look for joint damage. How is this treated? Treatment for this condition has two phases: treating an acute attack and preventing future attacks. Acute gout treatment may include medicines to reduce pain and swelling, including: NSAIDs. Steroids. These are strong anti-inflammatory medicines that can be taken by mouth (orally) or injected into a joint. Colchicine. This medicine  relieves pain and swelling when it is taken soon after an attack. It can be given by mouth or through an IV. Preventive treatment may include: Daily use of smaller doses of NSAIDs or colchicine. Use of a medicine that reduces uric acid levels in your blood. Changes to your diet. You may need to see a dietitian about what to eat and drink to prevent gout. Follow these instructions at home: During  a gout attack  If directed, put ice on the affected area: Put ice in a plastic bag. Place a towel between your skin and the bag. Leave the ice on for 20 minutes, 2-3 times a day. Raise (elevate) the affected joint above the level of your heart as often as possible. Rest the joint as much as possible. If the affected joint is in your leg, you may be given crutches to use. Follow instructions from your health care provider about eating or drinking restrictions. Avoiding future gout attacks Follow a low-purine diet as told by your dietitian or health care provider. Avoid foods and drinks that are high in purines, including liver, kidney, anchovies, asparagus, herring, mushrooms, mussels, and beer. Maintain a healthy weight or lose weight if you are overweight. If you want to lose weight, talk with your health care provider. It is important that you do not lose weight too quickly. Start or maintain an exercise program as told by your health care provider. Eating and drinking Drink enough fluids to keep your urine pale yellow. If you drink alcohol: Limit how much you use to: 0-1 drink a day for women. 0-2 drinks a day for men. Be aware of how much alcohol is in your drink. In the [place] one drink equals one 12 oz bottle of beer (355 mL) one 5 oz glass of wine (148 mL), or one 1 oz glass of hard liquor (44 mL). General instructions Take over-the-counter and prescription medicines only as told by your health care provider. Do not drive or use heavy machinery while taking prescription pain medicine. Return to your normal activities as told by your health care provider. Ask your health care provider what activities are safe for you. Keep all follow-up visits as told by your health care provider. This is important. Contact a health care provider if you have: Another gout attack. Continuing symptoms of a gout attack after 10 days of treatment. Side effects from your medicines. Chills or a  fever. Burning pain when you urinate. Pain in your lower back or belly. Get help right away if you: Have severe or uncontrolled pain. Cannot urinate. Summary Gout is painful swelling of the joints caused by inflammation. The most common site of pain is the big toe, but it can affect other joints in the body. Medicines and dietary changes can help to prevent and treat gout attacks. This information is not intended to replace advice given to you by your health care provider. Make sure you discuss any questions you have with your health care provider. Document Revised: 08/30/2017 Document Reviewed: 09/11/2017 Elsevier Patient Education  [place].

## 2021-02-09 NOTE — Progress Notes (Signed)
Keith Black, am serving as a Education administrator for Dr. Lynne Leader.  Keith Black. is a 55 y.o. male who presents to [place] at [facility] today for Left foot pain with swelling on the ball of the foot. Patient was last seen by Dr. Tamala Julian on 10/18/20 for bilateral elbow pain. Patent states the left foot pain has been going on since this weekend. Patient is unsure of what exactly happen just knows he was outside hanging christmas lights, that is the only thing different he has done. Patient locates pain to mid back bottom of foot to the toes.  He cannot think of really any traumatic injury that might of caused his toe pain.  He cannot think of any change in activity or medication or diet.  He notes he has gained about 20 pounds in the last 6 months or so as he has some knee pain and cannot move his much.  Aggravates: any pressure  Treatments tried: aleve, rest, voltaren     Pertinent review of systems: No fevers or chills  Relevant historical information: No history of gout   Exam:  BP 124/82   Pulse 98   Ht 5\' 8"  (1.727 m)   Wt 180 lb (81.6 kg)   SpO2 99%   BMI 27.37 kg/m  General: Well Developed, well nourished, and in no acute distress.   MSK: Left great toe erythematous with effusion and tender to palpation.  Decreased toe motion.  Otherwise foot is normal-appearing    Lab and Radiology Results  X-ray images left foot obtained today personally and independently interpreted No significant DJD.  No acute fractures.  Bone cyst likely present at proximal end of proximal phalanx first MTP Await formal radiology review    Chemistry      Component Value Date/Time   NA 133 (L) 10/02/2019 1107   NA 138 07/14/2018 0817   K 5.0 10/02/2019 1107   CL 96 10/02/2019 1107   CO2 29 10/02/2019 1107   BUN 17 10/02/2019 1107   BUN 17 07/14/2018 0817   CREATININE 1.09 10/02/2019 1107   CREATININE 1.08 04/25/2018 1506      Component Value Date/Time   CALCIUM  9.9 10/02/2019 1107   ALKPHOS 46 10/02/2019 1107   AST 20 10/02/2019 1107   ALT 16 10/02/2019 1107   BILITOT 0.9 10/02/2019 1107   BILITOT 0.8 10/28/2018 0848     No results found for: LABURIC      Assessment and Plan: 55 y.o. male with left first MTP pain.  Keith Black does not have a good injury history to explain his pain, redness and swelling.  He has had some weight gain recently.  Symptoms very consistent with podagra and gout.  Plan to check uric acid and recheck metabolic panel.  Treat with colchicine and hydrocodone.  Certainly can prescribe prednisone as a backup plan.  Recheck in 2 weeks.  Consider steroid injection. Patient rates pain as severe 9 out of 10.   PDMP reviewed during this encounter. Orders Placed This Encounter  Procedures   DG Foot Complete Left    Standing Status:   Future    Number of Occurrences:   1    Standing Expiration Date:   02/09/2022    Order Specific Question:   Reason for Exam (SYMPTOM  OR DIAGNOSIS REQUIRED)    Answer:   left great toe pain    Order Specific Question:   Preferred imaging location?    Answer:  [name]   Basic Metabolic Panel (BMET)   Uric acid    Standing Status:   Future    Number of Occurrences:   1    Standing Expiration Date:   02/09/2022   Meds ordered this encounter  Medications   colchicine 0.6 MG tablet    Sig: Take 1 tablet (0.6 mg total) by mouth daily as needed (gout or psuedogout pain).    Dispense:  30 tablet    Refill:  2   HYDROcodone-acetaminophen (NORCO/VICODIN) 5-325 MG tablet    Sig: Take 1 tablet by mouth every 6 (six) hours as needed.    Dispense:  10 tablet    Refill:  0     Discussed warning signs or symptoms. Please see discharge instructions. Patient expresses understanding.   The above documentation has been reviewed and is accurate and complete Lynne Leader, M.D.

## 2021-02-10 MED ORDER — ALLOPURINOL 300 MG PO TABS: 300.0000 mg | ORAL_TABLET | Freq: Every day | ORAL | 3 refills | Status: AC

## 2021-02-10 NOTE — Progress Notes (Signed)
Left foot x-ray shows a little bit of arthritis at the big toe joint.  If not improving with colchicine either I can prescribe oral prednisone you could return for injection.

## 2021-02-10 NOTE — Addendum Note (Signed)
Addended by: Gregor Hams on: 02/10/2021 06:27 AM   Modules accepted: Orders

## 2021-02-10 NOTE — Progress Notes (Signed)
Uric acid is elevated at 7.3.  Goal for gout is less than 6.  This indicates that gout is likely the cause of your toe pain. I will prescribe allopurinol.  Take allopurinol daily and we will want to recheck labs in about 6 weeks.

## 2021-02-14 ENCOUNTER — Other Ambulatory Visit: Payer: Self-pay | Admitting: Internal Medicine

## 2021-02-16 ENCOUNTER — Encounter: Payer: Self-pay | Admitting: Family Medicine

## 2021-03-08 NOTE — Progress Notes (Deleted)
° °  I, Wendy Poet, LAT, ATC, am serving as scribe for Dr. Lynne Leader.  Keith Black. is a 56 y.o. male who presents to [place] at [facility] today for f/u of L plantar foot pain and swelling at the ball of his foot, particularly his L 1st MTPJ.  He was last seen by Dr. Georgina Snell on 02/09/21 and was prescribed colchicine and hydrocodone.  He was also advised to purchase a cam walker boot.  Today, pt reports   Diagnostic testing: L foot XR- 02/09/21; uric acid and BMET- 02/09/21  Pertinent review of systems: ***  Relevant historical information: ***   Exam:  There were no vitals taken for this visit. General: Well Developed, well nourished, and in no acute distress.   MSK: ***    Lab and Radiology Results No results found for this or any previous visit (from the past 72 hour(s)). No results found.     Assessment and Plan: 56 y.o. male with ***   PDMP not reviewed this encounter. No orders of the defined types were placed in this encounter.  No orders of the defined types were placed in this encounter.    Discussed warning signs or symptoms. Please see discharge instructions. Patient expresses understanding.   ***

## 2021-03-09 ENCOUNTER — Ambulatory Visit: Payer: 59 | Admitting: Family Medicine

## 2021-03-13 ENCOUNTER — Ambulatory Visit: Payer: 59

## 2021-03-13 ENCOUNTER — Encounter: Payer: Self-pay | Admitting: Podiatry

## 2021-03-13 ENCOUNTER — Other Ambulatory Visit: Payer: Self-pay

## 2021-03-13 ENCOUNTER — Other Ambulatory Visit: Payer: Self-pay | Admitting: Podiatry

## 2021-03-13 ENCOUNTER — Ambulatory Visit (INDEPENDENT_AMBULATORY_CARE_PROVIDER_SITE_OTHER): Payer: 59 | Admitting: Podiatry

## 2021-03-13 DIAGNOSIS — M79672 Pain in left foot: Secondary | ICD-10-CM

## 2021-03-13 DIAGNOSIS — M109 Gout, unspecified: Secondary | ICD-10-CM

## 2021-03-13 NOTE — Progress Notes (Signed)
°  Subjective:  Patient ID: Keith Pile., male    DOB: 1965-11-18,   MRN: [id number]  Chief Complaint  Patient presents with   Pain    Redness,swelling and pain at Lt hallux joint x 1 mo - pain radiating up - pt Rx'ed allopurinol and colchicine 1 mo ago - 3/10 pian tx: icing and colchicine    56 y.o. male presents for redness and swelling in left joint that has been going on for about a month. Relates it started then and had a flare was seen by orthopadeics and placed on colchicine and allopurinol. Relates he was doing well and then a few days ago started another flare. Started colchicine again and started having improvement today. Relates the pain is improved since yesterday.  . Denies any other pedal complaints. Denies n/v/f/c.   Past Medical History:  Diagnosis Date   Allergy    CVA (cerebrovascular accident) ([place]) 04/08/2018   GERD (gastroesophageal reflux disease)    High cholesterol    History of chicken pox    Hypertension    LV (left ventricular) mural thrombus without MI ([place])    Migraine    Ocular migraine     Objective:  Physical Exam: Vascular: DP/PT pulses 2/4 bilateral. CFT <3 seconds. Normal hair growth on digits. No edema.  Skin. No lacerations or abrasions bilateral feet. Mild erythema noted to first MTPJ.  Musculoskeletal: MMT 5/5 bilateral lower extremities in DF, PF, Inversion and Eversion. Deceased ROM in DF of ankle joint.  Mild tenderness to ROM and palpation of the MTPJ on the left.  Neurological: Sensation intact to light touch.   Assessment:   1. Acute gout involving toe of left foot, unspecified cause      Plan:  Patient was evaluated and treated and all questions answered. -Xrays reviewed. No acute fractures or dislocations.  -Discussed treatement options for gouty arthritis and gout education provided. -Deferred injection or oral steroid at this time as pain has been improving.  -Discussed diet and modifications.  -Continue colchicine   -Ordered arthritic lab panel;Evaluate for pain in multiple joints throughout body.  Will call patient with results if abnormal -Advised patient to call if symptoms are not improved within 1 week -Patient to return as needed.   Lorenda Peck, DPM

## 2021-03-16 LAB — ARTHRITIS PANEL
Anti Nuclear Antibody (ANA): NEGATIVE
Rheumatoid fact SerPl-aCnc: 10.5 IU/mL (ref <14.0)
Sed Rate: 8 mm/hr (ref 0–30)
Uric Acid: 3.9 mg/dL (ref 3.8–8.4)

## 2021-04-05 ENCOUNTER — Other Ambulatory Visit: Payer: Self-pay | Admitting: Internal Medicine

## 2021-04-23 ENCOUNTER — Other Ambulatory Visit: Payer: Self-pay | Admitting: Internal Medicine

## 2021-05-02 ENCOUNTER — Other Ambulatory Visit: Payer: Self-pay | Admitting: Cardiology

## 2021-05-02 DIAGNOSIS — R7989 Other specified abnormal findings of blood chemistry: Secondary | ICD-10-CM

## 2021-05-03 ENCOUNTER — Other Ambulatory Visit: Payer: 59

## 2021-05-10 ENCOUNTER — Other Ambulatory Visit: Payer: 59

## 2021-06-06 ENCOUNTER — Ambulatory Visit
Admission: RE | Admit: 2021-06-06 | Discharge: 2021-06-06 | Disposition: A | Discharge status: Home / Self Care | Payer: 59 | Source: Ambulatory Visit | Attending: Cardiology | Admitting: Cardiology

## 2021-06-06 DIAGNOSIS — R7989 Other specified abnormal findings of blood chemistry: Secondary | ICD-10-CM

## 2021-07-04 ENCOUNTER — Other Ambulatory Visit: Payer: Self-pay | Admitting: Internal Medicine

## 2021-07-11 ENCOUNTER — Other Ambulatory Visit: Payer: Self-pay | Admitting: Family Medicine

## 2021-07-12 ENCOUNTER — Encounter: Payer: Self-pay | Admitting: Podiatry

## 2021-07-12 ENCOUNTER — Ambulatory Visit (INDEPENDENT_AMBULATORY_CARE_PROVIDER_SITE_OTHER): Payer: 59 | Admitting: Podiatry

## 2021-07-12 DIAGNOSIS — M109 Gout, unspecified: Secondary | ICD-10-CM | POA: Diagnosis not present

## 2021-07-12 MED ORDER — COLCHICINE 0.6 MG PO TABS: ORAL_TABLET | ORAL | 5 refills | Status: DC

## 2021-07-12 NOTE — Progress Notes (Signed)
?  Subjective:  ?Patient ID: Keith Black., male    DOB: September 08, 1965,  MRN: [id number] ? ?Chief Complaint  ?Patient presents with  ? Gout  ?   Gout flare up - needs injection in extreme amount of pain  ? ? ?56 y.o. male presents with the above complaint. History confirmed with patient.  He presented with redness swelling and an acute gout flare its been going on for about 5 or 6 days now.  He previously had 1 last winter for the first time.  He is on allopurinol.  Could not find his colchicine. ? ?Objective:  ?Physical Exam: ?warm, good capillary refill, no trophic changes or ulcerative lesions, normal DP and PT pulses, normal sensory exam, and severe redness warmth and swelling around the first MTPJ of the left foot painful to touch. ? ?Assessment:  ? ?1. Acute gout involving toe of left foot, unspecified cause   ? ? ? ?Plan:  ?Patient was evaluated and treated and all questions answered. ? ?We can discussed chronic and acute treatment of gout.  I discussed with him that we should treat this symptomatically and a corticosteroid injection was recommended.  Following sterile prep with Betadine 20 mg Kenalog and 1 cc of lidocaine was injected into the left first MTPJ.  He tolerated this well.  I also sent him a prescription for colchicine he can take this in 2 days if it has not improved by then.  Refills were placed on this as well so he can have them on hand at home to use for symptomatic control.  I discussed with him if he is having multiple flares per year he may want to see a rheumatologist or consider increase in allopurinol dosing or alternative chronic medication from his PCP.  He will return as needed for acute management. ? ?Return if symptoms worsen or fail to improve.  ? ?

## 2021-10-02 ENCOUNTER — Other Ambulatory Visit: Payer: Self-pay | Admitting: Family Medicine

## 2021-10-02 NOTE — Telephone Encounter (Signed)
Rx refill request approved per Dr. Corey's orders. 

## 2021-12-19 ENCOUNTER — Other Ambulatory Visit: Payer: Self-pay | Admitting: Internal Medicine

## 2022-12-21 ENCOUNTER — Emergency Department (HOSPITAL_COMMUNITY)
Admission: EM | Admit: 2022-12-21 | Discharge: 2022-12-21 | Disposition: A | Discharge status: Home / Self Care | Payer: 59 | Attending: Emergency Medicine | Admitting: Emergency Medicine

## 2022-12-21 ENCOUNTER — Other Ambulatory Visit: Payer: Self-pay

## 2022-12-21 ENCOUNTER — Encounter (HOSPITAL_COMMUNITY): Payer: Self-pay

## 2022-12-21 DIAGNOSIS — S61012A Laceration without foreign body of left thumb without damage to nail, initial encounter: Secondary | ICD-10-CM | POA: Insufficient documentation

## 2022-12-21 DIAGNOSIS — Z79899 Other long term (current) drug therapy: Secondary | ICD-10-CM | POA: Insufficient documentation

## 2022-12-21 DIAGNOSIS — Z7982 Long term (current) use of aspirin: Secondary | ICD-10-CM | POA: Insufficient documentation

## 2022-12-21 DIAGNOSIS — W260XXA Contact with knife, initial encounter: Secondary | ICD-10-CM | POA: Insufficient documentation

## 2022-12-21 DIAGNOSIS — Y93G9 Activity, other involving cooking and grilling: Secondary | ICD-10-CM | POA: Insufficient documentation

## 2022-12-21 DIAGNOSIS — I10 Essential (primary) hypertension: Secondary | ICD-10-CM | POA: Insufficient documentation

## 2022-12-21 MED ORDER — LIDOCAINE HCL (PF) 1 % IJ SOLN
5.0000 mL | Freq: Once | INTRAMUSCULAR | Status: AC
  Administered 2022-12-21: 5 mL
  Filled 2022-12-21: qty 5

## 2022-12-21 NOTE — ED Provider Notes (Signed)
[place] EMERGENCY DEPARTMENT AT [facility] Provider Note   CSN: [id number] Arrival date & time: 12/21/22  1656     History  Chief Complaint  Patient presents with   Cut tip of finger off    Keith Black. is a 57 y.o. male.  HPI Patient presents with laceration to left thumb.  Happened with knife while cutting up peppers.  Bleeding.  Has piece with him.   Past Medical History:  Diagnosis Date   Allergy    CVA (cerebrovascular accident) ([facility]) 04/08/2018   GERD (gastroesophageal reflux disease)    High cholesterol    History of chicken pox    Hypertension    LV (left ventricular) mural thrombus without MI ([facility])    Migraine    Ocular migraine     Home Medications Prior to Admission medications   Medication Sig Start Date End Date Taking? Authorizing Provider  acetaminophen (TYLENOL) 325 MG tablet Take 650 mg by mouth as needed for pain.    [provider]  allopurinol (ZYLOPRIM) 300 MG tablet Take 1 tablet (300 mg total) by mouth daily. 02/10/21   Rodolph Bong, MD  ALPRAZolam Prudy Feeler) 0.25 MG tablet TAKE 1 TABLET BY MOUTH TWICE A DAY AS NEEDED FOR ANXIETY 04/27/20   Wanda Plump, MD  aspirin 81 MG EC tablet Take 1 tablet (81 mg total) by mouth daily. 07/11/20   Wanda Plump, MD  atorvastatin (LIPITOR) 80 MG tablet Take 0.5 tablets (40 mg total) by mouth at bedtime. 12/06/20   Wanda Plump, MD  cetirizine (ZYRTEC) 10 MG tablet Take 10 mg by mouth daily.    [provider]  Cholecalciferol ([employer] VIT D 5000 HIGH-POTENCY PO) Take 1 capsule by mouth daily.    [provider]  colchicine 0.6 MG tablet TAKE 1 TABLET (0.6 MG TOTAL) BY MOUTH DAILY AS NEEDED (GOUT OR PSUEDOGOUT PAIN). 10/02/21   Rodolph Bong, MD  HYDROcodone-acetaminophen (NORCO/VICODIN) 5-325 MG tablet Take 1 tablet by mouth every 6 (six) hours as needed. 02/09/21   Rodolph Bong, MD  losartan (COZAAR) 50 MG tablet Take 1 tablet (50 mg total) by mouth daily. 07/11/20   Wanda Plump, MD   metoprolol tartrate (LOPRESSOR) 25 MG tablet TAKE 1 TABLET BY MOUTH TWICE A DAY 07/29/20   Wanda Plump, MD  pantoprazole (PROTONIX) 40 MG tablet Take 1 tablet (40 mg total) by mouth daily. 10/03/20   Wanda Plump, MD  PRALUENT 150 MG/ML SOAJ Inject 1 mL into the skin every 14 (fourteen) days. 02/18/20   [provider]      Allergies    Patient has no known allergies.    Review of Systems   Review of Systems  Physical Exam Updated Vital Signs BP 115/82 (BP Location: Right Arm)   Pulse 82   Temp 98.9 F (37.2 C) (Oral)   Resp 16   Ht 5\' 8"  (1.727 m)   Wt 77.1 kg   SpO2 98%   BMI 25.85 kg/m  Physical Exam Vitals reviewed.  Musculoskeletal:     Comments: Laceration to tip of left thumb.  Nail not involved.  Around 6 mm piece of tissue has been amputated.  Some bleeding but not arterial.  No bony involvement.  Neurological:     Mental Status: He is alert and oriented to person, place, and time.     ED Results / Procedures / Treatments   Labs (all labs ordered are listed,  but only abnormal results are displayed) Labs Reviewed - No data to display  EKG None  Radiology No results found.  Procedures Procedures    Medications Ordered in ED Medications  lidocaine (PF) (XYLOCAINE) 1 % injection 5 mL (5 mLs Infiltration Given 12/21/22 2027)    ED Course/ Medical Decision Making/ A&P                                 Medical Decision Making Risk Prescription drug management.  Patient with laceration to finger.  Amputated piece reattached.  He has high risk of not vascularizing the piece but I think it is a good biologic bandage.  Overall low risk.  Has had previous and recent tetanus shot.  Feels better after numbing. Wound closed.  Discharge home with outpatient follow-up with PCP and urgent care.        Final Clinical Impression(s) / ED Diagnoses Final diagnoses:  Laceration of left thumb without foreign body, nail damage status unspecified, initial  encounter    Rx / DC Orders ED Discharge Orders     None         Benjiman Core, MD 12/21/22 2259

## 2022-12-21 NOTE — ED Notes (Signed)
Bleeding controlled with pressure wrap att.

## 2022-12-21 NOTE — ED Triage Notes (Signed)
Pt came in via POV d/t cutting the pad of his Lt thumb off during cutting peppers. Has been bleeding for the past hr, still bleeding in triage. Clean/pressure drg applied to thumb in triage to stop bleeding. A/Ox4, rates his pain/pressure felt 7/10.

## 2022-12-21 NOTE — Discharge Instructions (Signed)
Follow up at Urgent care for suture removal in 7-8 days

## 2022-12-21 NOTE — ED Provider Notes (Signed)
Pt has a avulsion laceration to his left thumb.  Pt has tip here with him  LACERATION REPAIR Performed by: Langston Masker Authorized by: Langston Masker Consent: Verbal consent obtained. Risks and benefits: risks, benefits and alternatives were discussed Consent given by: patient Patient identity confirmed: provided demographic data Prepped and Draped in normal sterile fashion Wound explored  Laceration Location: left thumb  Laceration Length: 0.5cm  No Foreign Bodies seen or palpated  Anesthesia: digital block  Local anesthetic: lidocaine Amount of cleaning: standard  Skin closure: sutures  Number of sutures: 2  Technique: simple interrupted   Patient tolerance: Patient tolerated the procedure well with no immediate complications.     Osie Cheeks 12/21/22 2024    Benjiman Core, MD 12/21/22 2303

## 2023-01-02 ENCOUNTER — Ambulatory Visit: Payer: Self-pay | Admitting: Family Medicine

## 2023-01-02 ENCOUNTER — Encounter: Payer: Self-pay | Admitting: Family Medicine

## 2023-01-02 ENCOUNTER — Other Ambulatory Visit: Payer: Self-pay

## 2023-01-02 VITALS — BP 134/94 | HR 100 | Ht 68.0 in | Wt 188.0 lb

## 2023-01-02 DIAGNOSIS — M25562 Pain in left knee: Secondary | ICD-10-CM

## 2023-01-02 DIAGNOSIS — M25561 Pain in right knee: Secondary | ICD-10-CM

## 2023-01-02 DIAGNOSIS — M25511 Pain in right shoulder: Secondary | ICD-10-CM

## 2023-01-02 DIAGNOSIS — M19019 Primary osteoarthritis, unspecified shoulder: Secondary | ICD-10-CM | POA: Insufficient documentation

## 2023-01-02 DIAGNOSIS — M19011 Primary osteoarthritis, right shoulder: Secondary | ICD-10-CM | POA: Diagnosis not present

## 2023-01-02 DIAGNOSIS — M7050 Other bursitis of knee, unspecified knee: Secondary | ICD-10-CM | POA: Diagnosis not present

## 2023-01-02 DIAGNOSIS — G8929 Other chronic pain: Secondary | ICD-10-CM

## 2023-01-02 NOTE — Assessment & Plan Note (Signed)
Patient given injection and tolerated the procedure well, discussed icing regimen and home exercises, discussed which activities to do and which ones to avoid.  Increase activity slowly.  Follow-up again with me in 2 months.  Worsening pain will need to consider the possibility of formal physical therapy or further imaging.

## 2023-01-02 NOTE — Progress Notes (Signed)
[facility] [address] [place] [address] Phone: [phone] Subjective:   INadine Counts, am serving as a scribe for Dr. Antoine Primas.  I'm seeing this patient by the request  of:  Pcp, No  CC: bilateral knee pain and shoulder pain   [id number]  Excell Seltzer Mascud Whichard. is a 57 y.o. male coming in with complaint of B knee and shoulder pain. Both knees still bother him after doing recommended changes. R shoulder pain for over 6 months. Deep sharp pain with motion. Overhead is when he feels it the most.      Past Medical History:  Diagnosis Date   Allergy    CVA (cerebrovascular accident) ([facility]) 04/08/2018   GERD (gastroesophageal reflux disease)    High cholesterol    History of chicken pox    Hypertension    LV (left ventricular) mural thrombus without MI ([facility])    Migraine    Ocular migraine    Past Surgical History:  Procedure Laterality Date   HERNIA REPAIR Bilateral    LEFT HEART CATH AND CORONARY ANGIOGRAPHY N/A 04/10/2018   Procedure: LEFT HEART CATH AND CORONARY ANGIOGRAPHY;  Surgeon: Runell Gess, MD;  Location: [facility] INVASIVE CV LAB;  Service: Cardiovascular;  Laterality: N/A;   Social History   Socioeconomic History   Marital status: Married    Spouse name: Not on file   Number of children: 0   Years of education: Not on file   Highest education level: Not on file  Occupational History   Occupation: Pharmacologist: [employer]  Tobacco Use   Smoking status: Never   Smokeless tobacco: Current    Types: Snuff  Vaping Use   Vaping status: Never Used  Substance and Sexual Activity   Alcohol use: Yes    Comment: socially    Drug use: Never   Sexual activity: Not on file  Other Topics Concern   Not on file  Social History Narrative   Lives w/ wife in [place], moved to  [place] 2019   Social Determinants of Health   Financial Resource Strain: Low Risk  (09/02/2018)   Received from [facility]   Overall Financial Resource Strain (CARDIA)    Difficulty of Paying Living Expenses: Not hard at all  Food Insecurity: No Food Insecurity (09/02/2018)   Received from [facility]   Hunger Vital Sign    Worried About Running Out of Food in the Last Year: Never true    Ran Out of Food in the Last Year: Never true  Transportation Needs: No Transportation Needs (09/02/2018)   Received from [facility] - Transportation    In the past 12 months, has lack of transportation kept you from medical appointments or from getting medications?: No    Lack of Transportation (Non-Medical): No  Physical Activity: Sufficiently Active (09/02/2018)   Received from [facility]   Exercise Vital Sign    Days of Exercise per Week: 7 days    Minutes of Exercise per Session: 60 min  Stress: Not on file  Social Connections: Unknown (09/02/2018)   Received from [facility]   Social Connection and Isolation Panel [NHANES]    Frequency of Communication with Friends and Family: More than three times a week    Frequency of Social Gatherings with Friends and Family: Not on file    Attends Religious Services: Not  on file    Active Member of Clubs or Organizations: Not on file    Attends Banker Meetings: Not on file    Marital Status: Married   No Known Allergies Family History  Problem Relation Age of Onset   Allergies Mother    Asthma Mother    Heart disease Mother 41       had stents and then CABG   Stroke Father 77   Colon cancer Neg Hx    Prostate cancer Neg Hx      Current Outpatient Medications (Cardiovascular):    atorvastatin (LIPITOR) 80 MG tablet, Take 0.5 tablets (40 mg total) by mouth at bedtime.   losartan (COZAAR) 50 MG tablet, Take 1 tablet (50 mg total) by mouth daily.   metoprolol tartrate (LOPRESSOR) 25 MG tablet, TAKE 1 TABLET BY MOUTH TWICE A DAY   PRALUENT 150 MG/ML  SOAJ, Inject 1 mL into the skin every 14 (fourteen) days.  Current Outpatient Medications (Respiratory):    cetirizine (ZYRTEC) 10 MG tablet, Take 10 mg by mouth daily.  Current Outpatient Medications (Analgesics):    acetaminophen (TYLENOL) 325 MG tablet, Take 650 mg by mouth as needed for pain.   allopurinol (ZYLOPRIM) 300 MG tablet, Take 1 tablet (300 mg total) by mouth daily.   aspirin 81 MG EC tablet, Take 1 tablet (81 mg total) by mouth daily.   colchicine 0.6 MG tablet, TAKE 1 TABLET (0.6 MG TOTAL) BY MOUTH DAILY AS NEEDED (GOUT OR PSUEDOGOUT PAIN).   HYDROcodone-acetaminophen (NORCO/VICODIN) 5-325 MG tablet, Take 1 tablet by mouth every 6 (six) hours as needed.   Current Outpatient Medications (Other):    ALPRAZolam (XANAX) 0.25 MG tablet, TAKE 1 TABLET BY MOUTH TWICE A DAY AS NEEDED FOR ANXIETY   Cholecalciferol (CVS VIT D 5000 HIGH-POTENCY PO), Take 1 capsule by mouth daily.   pantoprazole (PROTONIX) 40 MG tablet, Take 1 tablet (40 mg total) by mouth daily.   Reviewed prior external information including notes and imaging from  primary care provider As well as notes that were available from care everywhere and other healthcare systems.  Past medical history, social, surgical and family history all reviewed in electronic medical record.  No pertanent information unless stated regarding to the chief complaint.   Review of Systems:  No headache, visual changes, nausea, vomiting, diarrhea, constipation, dizziness, abdominal pain, skin rash, fevers, chills, night sweats, weight loss, swollen lymph nodes, body aches, joint swelling, chest pain, shortness of breath, mood changes. POSITIVE muscle aches  Objective  Blood pressure (!) 134/94, pulse 100, height 5\' 8"  (1.727 m), weight 188 lb (85.3 kg), SpO2 98%.   General: No apparent distress alert and oriented x3 mood and affect normal, dressed appropriately.  HEENT: Pupils equal, extraocular movements intact  Respiratory: Patient's  speak in full sentences and does not appear short of breath  Cardiovascular: No lower extremity edema, non tender, no erythema  Right shoulder does have positive crossover noted.  Tender to palpation over the acromioclavicular joint.  Patient does have tightness noted.  5 out of 5 strength of the upper extremity  Procedure: Real-time Ultrasound Guided Injection of right acromioclavicular joint Device: GE Logiq Q7 Ultrasound guided injection is preferred based studies that show increased duration, increased effect, greater accuracy, decreased procedural pain, increased response rate, and decreased cost with ultrasound guided versus blind injection.  Verbal informed consent obtained.  Time-out conducted.  Noted no overlying erythema, induration, or other signs of local infection.  Skin prepped in  a sterile fashion.  Local anesthesia: Topical Ethyl chloride.  With sterile technique and under real time ultrasound guidance: With a 25-gauge half inch needle injected with 0.5 cc of 0.5% Marcaine and 0.5 cc of Kenalog 40 mg/mL Completed without difficulty  Pain immediately improved suggesting accurate placement of the medication.  Advised to call if fevers/chills, erythema, induration, drainage, or persistent bleeding.  Impression: Technically successful ultrasound guided injection.    Impression and Recommendations:    The above documentation has been reviewed and is accurate and complete Judi Saa, DO

## 2023-01-02 NOTE — Patient Instructions (Addendum)
Injection in [facility] joint Do prescribed exercises at least 3x a week  See you again in 2 months

## 2023-01-02 NOTE — Assessment & Plan Note (Signed)
Seems like it is doing relatively well.  Will continue to monitor.  Discussed which activities would be beneficial.  Increase activities as tolerated.  Will follow-up with me again only if worsening pain and we will consider formal physical therapy or potential injections

## 2023-01-15 ENCOUNTER — Ambulatory Visit: Payer: Self-pay | Admitting: Family Medicine
